# Patient Record
Sex: Male | Born: 1940 | Race: White | Hispanic: No | Smoking: Never smoker
Health system: Southern US, Community
[De-identification: ages and names within clinical notes are randomized; demographics above are authoritative.]

## PROBLEM LIST (undated history)

## (undated) DIAGNOSIS — K219 Gastro-esophageal reflux disease without esophagitis: Secondary | ICD-10-CM

## (undated) DIAGNOSIS — I4891 Unspecified atrial fibrillation: Secondary | ICD-10-CM

## (undated) DIAGNOSIS — I2510 Atherosclerotic heart disease of native coronary artery without angina pectoris: Secondary | ICD-10-CM

## (undated) DIAGNOSIS — R0600 Dyspnea, unspecified: Secondary | ICD-10-CM

## (undated) DIAGNOSIS — I739 Peripheral vascular disease, unspecified: Secondary | ICD-10-CM

## (undated) DIAGNOSIS — I82409 Acute embolism and thrombosis of unspecified deep veins of unspecified lower extremity: Secondary | ICD-10-CM

## (undated) DIAGNOSIS — C801 Malignant (primary) neoplasm, unspecified: Secondary | ICD-10-CM

## (undated) DIAGNOSIS — E119 Type 2 diabetes mellitus without complications: Secondary | ICD-10-CM

## (undated) DIAGNOSIS — Z7901 Long term (current) use of anticoagulants: Secondary | ICD-10-CM

## (undated) DIAGNOSIS — Z9641 Presence of insulin pump (external) (internal): Secondary | ICD-10-CM

## (undated) DIAGNOSIS — G709 Myoneural disorder, unspecified: Secondary | ICD-10-CM

## (undated) DIAGNOSIS — T8859XA Other complications of anesthesia, initial encounter: Secondary | ICD-10-CM

## (undated) DIAGNOSIS — T4145XA Adverse effect of unspecified anesthetic, initial encounter: Secondary | ICD-10-CM

## (undated) DIAGNOSIS — M1990 Unspecified osteoarthritis, unspecified site: Secondary | ICD-10-CM

## (undated) DIAGNOSIS — I219 Acute myocardial infarction, unspecified: Secondary | ICD-10-CM

## (undated) DIAGNOSIS — G4730 Sleep apnea, unspecified: Secondary | ICD-10-CM

## (undated) HISTORY — PX: CORONARY ANGIOPLASTY WITH STENT PLACEMENT: SHX49

## (undated) HISTORY — PX: BACK SURGERY: SHX140

## (undated) HISTORY — PX: DIAGNOSTIC LAPAROSCOPY: SUR761

## (undated) HISTORY — PX: EYE SURGERY: SHX253

## (undated) HISTORY — PX: TONSILLECTOMY: SUR1361

## (undated) HISTORY — PX: AMPUTATION TOE: SHX6595

## (undated) NOTE — *Deleted (*Deleted)
[facility] Perioperative Services  Pre-Admission/Anesthesia Testing Clinical Review  Date: 07/06/20  Patient Demographics:  Name: Carl Yang DOB:   [birth year] MRN:   [id number]  Planned Surgical Procedure(s):    Case: [id number] Date/Time: 07/08/20 0931   Procedure: EXCISION BONE METATARSAL LEFT (Left )   Anesthesia type: Choice   Pre-op diagnosis: L97.525 ULCER LEFT FOOT   Location: [facility] OR ROOM 03 / [facility] ORS FOR ANESTHESIA GROUP   Surgeons: Gwyneth Revels, DPM     NOTE: Available PAT nursing documentation and vital signs have been reviewed. Clinical nursing staff has updated patient's PMH/PSHx, current medication list, and drug allergies/intolerances to ensure comprehensive history available to assist in medical decision making as it pertains to the aforementioned surgical procedure and anticipated anesthetic course.   Clinical Discussion:  Carl Yang is a 89 y.o. male who is submitted for pre-surgical anesthesia review and clearance prior to him undergoing the above procedure. Patient has never been a smoker. Pertinent PMH includes: CAD (s/p PCI and stent placement), MI x2 (1998 and 2003), A. fib, PVD, DVT, HTN, HLD, T1DM (on insulin pump), OSAH (requires nocturnal PAP therapy), dyspnea, GERD (on daily PPI), anemia of chronic disease, OA, peripheral neuropathy, diabetic retinopathy.  Patient is followed by cardiology Lady Gary, MD). He was last seen in the cardiology clinic on 07/04/2020; notes reviewed.  At the time of his last clinic visit, patient was reported to be doing "fairly well" from a cardiac perspective.  He denied any chest pain, significant shortness of breath, PND, orthopnea, palpitations, vertiginous symptoms, presyncope/syncope.  Patient with lower extremity peripheral vascular disease and has plans to undergo podiatric surgery.  Patient's hypertension well controlled on ACEi monotherapy.  He is on a statin for his HLD.  Patient chronically  anticoagulated (warfarin) for his A. fib.  Last TTE done in 03/2006 revealed a LV systolic function with an LVEF of >55%.  Cardiac catheterization performed in 07/2012 revealed two 95% lesions to the mid LAD, and a 75% lesion to the D2; lesions were stented.  I do not see evidence of any further cardiovascular testing. Patient with a functional capacity of >4 METS; activity described as "fairly vigorous". Patient scheduled to follow up with outpatient cardiology in 1 year.   Patient scheduled to undergo podiatric surgery on 07/08/2020 with Dr. Gwyneth Revels.  Patient with an infected ulcer on his foot.  Patient with T1 DM diagnosis.  He was seen by endocrinology on 07/05/2020 and changes were made to his medication regimen.  Given the current infected nature of his wound, both podiatry and endocrinology recommending that patient proceed with surgery despite current glycemic control; hemoglobin A1c 8.3%. Per cardiology, "this patient is optimized for surgery and may proceed with the planned procedural course with a LOW risk stratification. Would proceed with routine cardiopulmonary monitoring. No further cardiac work-up indicated".  This patient is on daily anticoagulation therapy. He has been instructed on recommendations for holding his warfarin for 5 days prior to his procedure. The patient's last dose of his anticoagulant was taken on 07/02/2020.   He reports previous perioperative complications, however is unsure if they are related to his anesthetic course. He reports that after having surgical procedure at [facility] he "coded" during the night. He underwent a general anesthetic course here (ASA III) in 01/2020 with no documented complications. Of note, patient has insulin pump in place. Diabetes coordinator has been made aware. Recommendations are to leave in place during procedure.   Vitals with BMI 06/28/2020  02/12/2020 02/11/2020  Height 6\' 3"  - -  Weight 228 lbs - -  BMI 28.5 - -  Systolic 148 133 956   Diastolic 74 78 62  Pulse 43 82 80    Providers/Specialists:   NOTE: Primary physician provider listed below. Patient may have been seen by APP or partner within same practice.   PROVIDER ROLE LAST Sullivan Lone, DPM Podiatry (Surgeon) 06/30/2020  Marisue Ivan, MD Primary Care Provider 05/12/2020  Harold Hedge, MD Cardiology 07/04/2020   Allergies:  Patient has no known allergies.  Current Home Medications:   . atorvastatin (LIPITOR) 80 MG tablet  . docusate sodium (COLACE) 100 MG capsule  . enoxaparin (LOVENOX) 40 MG/0.4ML injection  . esomeprazole (NEXIUM) 40 MG capsule  . fexofenadine (ALLEGRA) 180 MG tablet  . HUMALOG 100 UNIT/ML injection  . metFORMIN (GLUCOPHAGE) 500 MG tablet  . Multiple Vitamin (MULTIVITAMIN WITH MINERALS) TABS tablet  . ramipril (ALTACE) 2.5 MG capsule  . enoxaparin (LOVENOX) 40 MG/0.4ML injection  . Insulin Human (INSULIN PUMP) SOLN  . oxyCODONE (OXY IR/ROXICODONE) 5 MG immediate release tablet  . traMADol (ULTRAM) 50 MG tablet  . warfarin (COUMADIN) 3 MG tablet  . warfarin (COUMADIN) 4 MG tablet   No current facility-administered medications for this encounter.   History:   Past Medical History:  Diagnosis Date  . Arthritis   . Atrial fibrillation (HCC)   . Cancer (HCC)    Melanoma  . Chronic anticoagulation    Warfarin  . Complication of anesthesia    After surgery at [facility], the patient coded during the night.  . Coronary artery disease    Stents  . Diabetes mellitus without complication (HCC)   . DVT (deep venous thrombosis) (HCC)    Right lower limb  . Dyspnea   . GERD (gastroesophageal reflux disease)   . Myocardial infarction (HCC)    1998, 2003  . Neuromuscular disorder (HCC)    Peripheral Neuropathy r/t DM  . Peripheral vascular disease (HCC)    Diabetes  . Sleep apnea    Uses CPAP   Past Surgical History:  Procedure Laterality Date  . 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  Marland Kitchen  AMPUTATION TOE Left 10/09/2018   Procedure: TOE IPJ 2ND TOE LEFT;  Surgeon: Recardo Evangelist, DPM;  Location: [facility];  Service: Podiatry;  Laterality: Left;  IVA LOCAL Diabeticv - insulin pump  . BACK SURGERY     L4 & L5 Fusion  . CARDIAC CATHETERIZATION  2003  . CORONARY ANGIOPLASTY WITH STENT PLACEMENT     stents x 2  . DIAGNOSTIC LAPAROSCOPY    . EYE SURGERY Bilateral    cataracts  . PARTIAL KNEE ARTHROPLASTY Left 02/09/2020   Procedure: UNICOMPARTMENTAL KNEE;  Surgeon: Christena Flake, MD;  Location: [facility] ORS;  Service: Orthopedics;  Laterality: Left;  . TONSILLECTOMY    . TOTAL KNEE ARTHROPLASTY Right 07/15/2018   Procedure: TOTAL KNEE ARTHROPLASTY;  Surgeon: Christena Flake, MD;  Location: [facility] ORS;  Service: Orthopedics;  Laterality: Right;  . TOTAL KNEE REVISION Right 05/05/2019   Procedure: TOTAL KNEE REVISION;  Surgeon: Christena Flake, MD;  Location: [facility] ORS;  Service: Orthopedics;  Laterality: Right;   History reviewed. No pertinent family history. Social History   Tobacco Use  . Smoking status: Never Smoker  . Smokeless tobacco: Never Used  Vaping Use  . Vaping Use: Never used  Substance Use Topics  . Alcohol use:  Yes    Alcohol/week: 1.0 standard drink    Types: 1 Glasses of wine per week    Comment: Socially  . Drug use: Never    Pertinent Clinical Results:  LABS: {CHL AN LABS REVIEWED:112001::"Labs reviewed: Acceptable for surgery."}  No visits with results within 3 Day(s) from this visit.  Latest known visit with results is:  Hospital Outpatient Visit on 02/26/2020  Component Date Value Ref Range Status  . Prothrombin Time 02/26/2020 19.1* 11.4 - 15.2 seconds Final  . INR 02/26/2020 1.7* 0.8 - 1.2 Final   Comment: (NOTE) INR goal varies based on device and disease states. Performed at [facility], [address] [place], [place] [address]     ECG: Date: 02/01/2020 Time ECG obtained: 1009 AM Rate: 92 bpm Rhythm: normal sinus  Axis (leads I and aVF): Normal Intervals: PR 176 ms. QRS 90 ms. QTc 427 ms. ST segment and T wave changes: Nonspecific ST abnormality  Comparison: Similar to previous tracing obtained on 05/05/2019   IMAGING / PROCEDURES: ECHOCARDIOGRAM done on 04/03/2006 1. LVEF >55% 2. Normal LV systolic function 3. Diastolic function class: Relaxation abnormality (grade 1) corresponds to E/A reversal 4. No valvular regurgitation 5. No valvular stenosis  LEFT HEART CATHETERIZATION done on 12/09/1997 1. Lesion #1: Mid LAD 95% (pre) to <25% (post)  2. Lesion #2: Mid LAD 95% (pre) to <25% (post) 3. Lesion #3: D2 75% (pre) to <25% (post)   Impression and Plan:  Carl Yang has been referred for pre-anesthesia review and clearance prior to him undergoing the planned anesthetic and procedural courses. Available labs, pertinent testing, and imaging results were personally reviewed by me. This patient has been appropriately cleared by cardiology. Diabetes coordinator aware of patient having procedure, as he has an insulin pump in place.    Based on clinical review performed today (07/06/20), barring any significant acute changes in the patient's overall condition, it is anticipated that he will be able to proceed with the planned surgical intervention. Any acute changes in clinical condition may necessitate his procedure being postponed and/or cancelled. Pre-surgical instructions were reviewed with the patient during his PAT appointment and questions were fielded by PAT clinical staff.  Quentin Mulling, MSN, APRN, FNP-C, CEN [facility]  Peri-operative Services Nurse Practitioner Phone: [phone] 07/06/20 1:55 PM  NOTE: This note has been prepared using Dragon dictation software. Despite my best ability to proofread, there is always the potential that unintentional transcriptional errors may still occur from this process.

---

## 2001-09-17 HISTORY — PX: CARDIAC CATHETERIZATION: SHX172

## 2004-08-22 ENCOUNTER — Ambulatory Visit: Payer: Self-pay | Admitting: Internal Medicine

## 2004-09-17 ENCOUNTER — Ambulatory Visit: Payer: Self-pay | Admitting: Internal Medicine

## 2005-03-05 ENCOUNTER — Ambulatory Visit: Payer: Self-pay | Admitting: Internal Medicine

## 2005-03-17 ENCOUNTER — Ambulatory Visit: Payer: Self-pay | Admitting: Internal Medicine

## 2005-04-17 ENCOUNTER — Ambulatory Visit: Payer: Self-pay | Admitting: Internal Medicine

## 2005-08-22 ENCOUNTER — Ambulatory Visit: Payer: Self-pay | Admitting: *Deleted

## 2005-09-12 ENCOUNTER — Inpatient Hospital Stay: Payer: Self-pay | Admitting: Cardiology

## 2005-09-12 ENCOUNTER — Other Ambulatory Visit: Payer: Self-pay

## 2005-10-15 ENCOUNTER — Ambulatory Visit: Payer: Self-pay | Admitting: *Deleted

## 2005-10-30 ENCOUNTER — Other Ambulatory Visit: Payer: Self-pay

## 2006-11-22 ENCOUNTER — Inpatient Hospital Stay: Payer: Self-pay | Admitting: Internal Medicine

## 2008-07-20 ENCOUNTER — Inpatient Hospital Stay: Payer: Self-pay | Admitting: Internal Medicine

## 2009-03-16 ENCOUNTER — Ambulatory Visit: Payer: Self-pay | Admitting: Internal Medicine

## 2009-04-12 ENCOUNTER — Ambulatory Visit: Payer: Self-pay | Admitting: Internal Medicine

## 2009-04-25 ENCOUNTER — Ambulatory Visit: Payer: Self-pay | Admitting: Unknown Physician Specialty

## 2009-10-21 ENCOUNTER — Ambulatory Visit: Payer: Self-pay | Admitting: Podiatry

## 2012-04-25 ENCOUNTER — Ambulatory Visit: Payer: Self-pay | Admitting: Family Medicine

## 2014-03-23 DIAGNOSIS — E109 Type 1 diabetes mellitus without complications: Secondary | ICD-10-CM | POA: Insufficient documentation

## 2014-03-23 DIAGNOSIS — E1069 Type 1 diabetes mellitus with other specified complication: Secondary | ICD-10-CM | POA: Insufficient documentation

## 2014-05-05 DIAGNOSIS — Z86718 Personal history of other venous thrombosis and embolism: Secondary | ICD-10-CM | POA: Insufficient documentation

## 2014-08-18 DIAGNOSIS — Z860101 Personal history of adenomatous and serrated colon polyps: Secondary | ICD-10-CM | POA: Insufficient documentation

## 2014-08-18 DIAGNOSIS — Z86010 Personal history of colonic polyps: Secondary | ICD-10-CM | POA: Insufficient documentation

## 2014-12-23 ENCOUNTER — Ambulatory Visit
Admit: 2014-12-23 | Disposition: A | Discharge status: Home / Self Care | Payer: Self-pay | Attending: Unknown Physician Specialty | Admitting: Unknown Physician Specialty

## 2014-12-24 DIAGNOSIS — Z85820 Personal history of malignant melanoma of skin: Secondary | ICD-10-CM | POA: Insufficient documentation

## 2015-01-10 LAB — SURGICAL PATHOLOGY

## 2015-10-20 DIAGNOSIS — Z89422 Acquired absence of other left toe(s): Secondary | ICD-10-CM | POA: Insufficient documentation

## 2016-03-13 DIAGNOSIS — Z7185 Encounter for immunization safety counseling: Secondary | ICD-10-CM | POA: Insufficient documentation

## 2016-03-13 DIAGNOSIS — Z87898 Personal history of other specified conditions: Secondary | ICD-10-CM | POA: Insufficient documentation

## 2017-03-05 DIAGNOSIS — Z0000 Encounter for general adult medical examination without abnormal findings: Secondary | ICD-10-CM | POA: Insufficient documentation

## 2017-05-23 DIAGNOSIS — M1712 Unilateral primary osteoarthritis, left knee: Secondary | ICD-10-CM | POA: Insufficient documentation

## 2018-02-07 ENCOUNTER — Other Ambulatory Visit: Payer: Self-pay | Admitting: Surgery

## 2018-02-07 DIAGNOSIS — M1712 Unilateral primary osteoarthritis, left knee: Secondary | ICD-10-CM

## 2018-02-07 DIAGNOSIS — M1711 Unilateral primary osteoarthritis, right knee: Secondary | ICD-10-CM

## 2018-06-25 ENCOUNTER — Other Ambulatory Visit: Payer: Self-pay | Admitting: Surgery

## 2018-06-25 ENCOUNTER — Ambulatory Visit
Admission: RE | Admit: 2018-06-25 | Discharge: 2018-06-25 | Disposition: A | Discharge status: Home / Self Care | Payer: Medicare Other | Source: Ambulatory Visit | Attending: Surgery | Admitting: Surgery

## 2018-06-25 DIAGNOSIS — S83281A Other tear of lateral meniscus, current injury, right knee, initial encounter: Secondary | ICD-10-CM | POA: Diagnosis not present

## 2018-06-25 DIAGNOSIS — M1711 Unilateral primary osteoarthritis, right knee: Secondary | ICD-10-CM

## 2018-06-25 DIAGNOSIS — M1712 Unilateral primary osteoarthritis, left knee: Secondary | ICD-10-CM

## 2018-06-25 DIAGNOSIS — S83241A Other tear of medial meniscus, current injury, right knee, initial encounter: Secondary | ICD-10-CM | POA: Diagnosis not present

## 2018-07-02 ENCOUNTER — Ambulatory Visit
Admission: RE | Admit: 2018-07-02 | Discharge: 2018-07-02 | Disposition: A | Discharge status: Home / Self Care | Payer: Medicare Other | Source: Ambulatory Visit | Attending: Surgery | Admitting: Surgery

## 2018-07-02 ENCOUNTER — Encounter
Admission: RE | Admit: 2018-07-02 | Discharge: 2018-07-02 | Disposition: A | Discharge status: Home / Self Care | Payer: Medicare Other | Source: Ambulatory Visit | Attending: Surgery | Admitting: Surgery

## 2018-07-02 ENCOUNTER — Other Ambulatory Visit: Payer: Self-pay

## 2018-07-02 DIAGNOSIS — R9431 Abnormal electrocardiogram [ECG] [EKG]: Secondary | ICD-10-CM | POA: Diagnosis not present

## 2018-07-02 DIAGNOSIS — Z419 Encounter for procedure for purposes other than remedying health state, unspecified: Secondary | ICD-10-CM

## 2018-07-02 DIAGNOSIS — E119 Type 2 diabetes mellitus without complications: Secondary | ICD-10-CM | POA: Insufficient documentation

## 2018-07-02 DIAGNOSIS — Z01818 Encounter for other preprocedural examination: Secondary | ICD-10-CM | POA: Diagnosis not present

## 2018-07-02 DIAGNOSIS — I700 Atherosclerosis of aorta: Secondary | ICD-10-CM | POA: Diagnosis not present

## 2018-07-02 DIAGNOSIS — K449 Diaphragmatic hernia without obstruction or gangrene: Secondary | ICD-10-CM | POA: Insufficient documentation

## 2018-07-02 HISTORY — DX: Dyspnea, unspecified: R06.00

## 2018-07-02 HISTORY — DX: Acute embolism and thrombosis of unspecified deep veins of unspecified lower extremity: I82.409

## 2018-07-02 HISTORY — DX: Adverse effect of unspecified anesthetic, initial encounter: T41.45XA

## 2018-07-02 HISTORY — DX: Unspecified osteoarthritis, unspecified site: M19.90

## 2018-07-02 HISTORY — DX: Other complications of anesthesia, initial encounter: T88.59XA

## 2018-07-02 HISTORY — DX: Atherosclerotic heart disease of native coronary artery without angina pectoris: I25.10

## 2018-07-02 HISTORY — DX: Myoneural disorder, unspecified: G70.9

## 2018-07-02 HISTORY — DX: Gastro-esophageal reflux disease without esophagitis: K21.9

## 2018-07-02 HISTORY — DX: Type 2 diabetes mellitus without complications: E11.9

## 2018-07-02 HISTORY — DX: Sleep apnea, unspecified: G47.30

## 2018-07-02 HISTORY — DX: Acute myocardial infarction, unspecified: I21.9

## 2018-07-02 HISTORY — DX: Malignant (primary) neoplasm, unspecified: C80.1

## 2018-07-02 HISTORY — DX: Peripheral vascular disease, unspecified: I73.9

## 2018-07-02 LAB — TYPE AND SCREEN
ABO/RH(D): A NEG
Antibody Screen: NEGATIVE

## 2018-07-02 LAB — BASIC METABOLIC PANEL
Anion gap: 7 (ref 5–15)
BUN: 22 mg/dL (ref 8–23)
CALCIUM: 8.7 mg/dL — AB (ref 8.9–10.3)
CO2: 26 mmol/L (ref 22–32)
CREATININE: 1.29 mg/dL — AB (ref 0.61–1.24)
Chloride: 108 mmol/L (ref 98–111)
GFR calc Af Amer: >60 mL/min (ref >60)
GFR, EST NON AFRICAN AMERICAN: 53 mL/min — AB (ref >60)
Glucose, Bld: 108 mg/dL — ABNORMAL HIGH (ref 70–99)
POTASSIUM: 4.7 mmol/L (ref 3.5–5.1)
Sodium: 141 mmol/L (ref 135–145)

## 2018-07-02 LAB — CBC
HCT: 44.2 % (ref 39.0–52.0)
HEMOGLOBIN: 14.4 g/dL (ref 13.0–17.0)
MCH: 31 pg (ref 26.0–34.0)
MCHC: 32.6 g/dL (ref 30.0–36.0)
MCV: 95.1 fL (ref 80.0–100.0)
Platelets: 170 10*3/uL (ref 150–400)
RBC: 4.65 MIL/uL (ref 4.22–5.81)
RDW: 13.7 % (ref 11.5–15.5)
WBC: 7 10*3/uL (ref 4.0–10.5)
nRBC: 0 % (ref 0.0–0.2)

## 2018-07-02 LAB — URINALYSIS, ROUTINE W REFLEX MICROSCOPIC
BILIRUBIN URINE: NEGATIVE
Glucose, UA: NEGATIVE mg/dL
Hgb urine dipstick: NEGATIVE
Ketones, ur: NEGATIVE mg/dL
Leukocytes, UA: NEGATIVE
NITRITE: NEGATIVE
PROTEIN: NEGATIVE mg/dL
SPECIFIC GRAVITY, URINE: 1.014 (ref 1.005–1.030)
pH: 6 (ref 5.0–8.0)

## 2018-07-02 LAB — SURGICAL PCR SCREEN
MRSA, PCR: NEGATIVE
STAPHYLOCOCCUS AUREUS: NEGATIVE

## 2018-07-02 NOTE — Pre-Procedure Instructions (Signed)
Dr. Marcello Moores was consulted regarding the patient's insulin pump instructions for day of surgery.The patient was instructed to continue his normal settings with the insulin pump. He is to check his blood sugar at home on the morning of surgery and adjust his pump dosing accordingly. Patient verbalized understanding.

## 2018-07-02 NOTE — Patient Instructions (Addendum)
Your procedure is scheduled on: Tuesday 07/15/18.  Report to DAY SURGERY DEPARTMENT LOCATED ON 2ND FLOOR MEDICAL MALL ENTRANCE. To find out your arrival time please call [phone] between 1PM - 3PM on Monday 07/14/18.  Remember: Instructions that are not followed completely may result in serious medical risk, up to and including death, or upon the discretion of your surgeon and anesthesiologist your surgery may need to be rescheduled.     _X__ 1. Do not eat food after midnight the night before your procedure.                 No gum chewing or hard candies. You may drink SUGAR FREE clear liquids up to 2 hours                 before you are scheduled to arrive for your surgery- DO NOT drink clear                 liquids within 2 hours of the start of your surgery.                  __X__2.  On the morning of surgery brush your teeth with toothpaste and water, you may rinse your mouth with mouthwash if you wish.  Do not swallow any toothpaste or mouthwash.     _X__ 3.  No Alcohol for 24 hours before or after surgery.   _X__ 4.  Do Not Smoke or use e-cigarettes For 24 Hours Prior to Your Surgery.                 Do not use any chewable tobacco products for at least 6 hours prior to                 surgery.  __X__ 5.  Notify your doctor if there is any change in your medical condition      (cold, fever, infections).     Do not wear jewelry, make-up, hairpins, clips or nail polish. Do not wear lotions, powders, or perfumes.  Do not shave 48 hours prior to surgery. Men may shave face and neck. Do not bring valuables to the hospital.    [facility] is not responsible for any belongings or valuables.  Contacts, dentures/partials or body piercings may not be worn into surgery. Bring a case for your contacts, glasses or hearing aids, a denture cup will be supplied. Leave your suitcase in the car. After surgery it may be brought to your room. For patients admitted to the hospital, discharge  time is determined by your treatment team.   Patients discharged the day of surgery will not be allowed to drive home.   Please read over the following fact sheets that you were given:   MRSA Information  __X__ Take these medicines the morning of surgery with A SIP OF WATER:     1. esomeprazole (NEXIUM) 40 MG capsule   2. fexofenadine (ALLEGRA) 180 MG tablet   __X__ Bring your CPAP with you  __X__ Bring any materials you need for your insulin pump.   __X__ Use CHG Soap/SAGE wipes as directed  __X__ Stop Metformin. Last dose will be on Saturday 07/12/18.   __X__ Continue your normal insulin pump routine. Check your blood sugar on the morning of surgery and make your usual adjustments. The anesthesia team will monitor your needs while you are in the operative area.  __X__ Stop Blood Thinners Coumadin/Plavix/Xarelto/Pleta/Pradaxa/Eliquis/Effient/Aspirin. Check with Dr. Netty Starring about stopping your Coumadin for  surgery.  __X__ Stop Anti-inflammatories 7 days before surgery such as Advil, Ibuprofen, Motrin, BC or Goodies Powder, Naprosyn, Naproxen, Aleve, Aspirin, Meloxicam. May take Tylenol if needed for pain or discomfort.   __X__ Stop all herbal supplements until after your procedure. (CBD Oil)

## 2018-07-03 LAB — URINE CULTURE: Culture: NO GROWTH

## 2018-07-10 NOTE — Pre-Procedure Instructions (Signed)
CLEARANCE BY DR Netty Starring MEDIUM RISK ON CHART

## 2018-07-14 MED ORDER — CEFAZOLIN SODIUM-DEXTROSE 2-4 GM/100ML-% IV SOLN
2.0000 g | Freq: Once | INTRAVENOUS | Status: AC
  Administered 2018-07-15: 2 g via INTRAVENOUS

## 2018-07-15 ENCOUNTER — Inpatient Hospital Stay: Payer: Medicare Other

## 2018-07-15 ENCOUNTER — Encounter
Admission: RE | Disposition: A | Discharge status: Skilled Nursing Facility | Payer: Self-pay | Source: Home / Self Care | Attending: Surgery

## 2018-07-15 ENCOUNTER — Encounter: Payer: Self-pay | Admitting: *Deleted

## 2018-07-15 ENCOUNTER — Inpatient Hospital Stay: Payer: Medicare Other | Admitting: Certified Registered Nurse Anesthetist

## 2018-07-15 ENCOUNTER — Inpatient Hospital Stay
Admission: RE | Admit: 2018-07-15 | Discharge: 2018-07-18 | DRG: 470 | Disposition: A | Discharge status: Skilled Nursing Facility | Payer: Medicare Other | Attending: Surgery | Admitting: Surgery

## 2018-07-15 ENCOUNTER — Other Ambulatory Visit: Payer: Self-pay

## 2018-07-15 DIAGNOSIS — E785 Hyperlipidemia, unspecified: Secondary | ICD-10-CM | POA: Diagnosis present

## 2018-07-15 DIAGNOSIS — Z955 Presence of coronary angioplasty implant and graft: Secondary | ICD-10-CM | POA: Diagnosis not present

## 2018-07-15 DIAGNOSIS — Z794 Long term (current) use of insulin: Secondary | ICD-10-CM

## 2018-07-15 DIAGNOSIS — K219 Gastro-esophageal reflux disease without esophagitis: Secondary | ICD-10-CM | POA: Diagnosis present

## 2018-07-15 DIAGNOSIS — Z85820 Personal history of malignant melanoma of skin: Secondary | ICD-10-CM

## 2018-07-15 DIAGNOSIS — Z96651 Presence of right artificial knee joint: Secondary | ICD-10-CM

## 2018-07-15 DIAGNOSIS — Z79899 Other long term (current) drug therapy: Secondary | ICD-10-CM

## 2018-07-15 DIAGNOSIS — Z981 Arthrodesis status: Secondary | ICD-10-CM | POA: Diagnosis not present

## 2018-07-15 DIAGNOSIS — E1142 Type 2 diabetes mellitus with diabetic polyneuropathy: Secondary | ICD-10-CM | POA: Diagnosis present

## 2018-07-15 DIAGNOSIS — Z9842 Cataract extraction status, left eye: Secondary | ICD-10-CM | POA: Diagnosis not present

## 2018-07-15 DIAGNOSIS — Z89412 Acquired absence of left great toe: Secondary | ICD-10-CM | POA: Diagnosis not present

## 2018-07-15 DIAGNOSIS — Z89422 Acquired absence of other left toe(s): Secondary | ICD-10-CM

## 2018-07-15 DIAGNOSIS — I252 Old myocardial infarction: Secondary | ICD-10-CM | POA: Diagnosis not present

## 2018-07-15 DIAGNOSIS — Z7901 Long term (current) use of anticoagulants: Secondary | ICD-10-CM

## 2018-07-15 DIAGNOSIS — M1711 Unilateral primary osteoarthritis, right knee: Principal | ICD-10-CM | POA: Diagnosis present

## 2018-07-15 DIAGNOSIS — E7800 Pure hypercholesterolemia, unspecified: Secondary | ICD-10-CM | POA: Diagnosis present

## 2018-07-15 DIAGNOSIS — E1151 Type 2 diabetes mellitus with diabetic peripheral angiopathy without gangrene: Secondary | ICD-10-CM | POA: Diagnosis present

## 2018-07-15 DIAGNOSIS — I4891 Unspecified atrial fibrillation: Secondary | ICD-10-CM

## 2018-07-15 DIAGNOSIS — I2510 Atherosclerotic heart disease of native coronary artery without angina pectoris: Secondary | ICD-10-CM | POA: Diagnosis present

## 2018-07-15 DIAGNOSIS — Z86718 Personal history of other venous thrombosis and embolism: Secondary | ICD-10-CM | POA: Diagnosis not present

## 2018-07-15 DIAGNOSIS — Z86010 Personal history of colonic polyps: Secondary | ICD-10-CM | POA: Diagnosis not present

## 2018-07-15 DIAGNOSIS — Z9841 Cataract extraction status, right eye: Secondary | ICD-10-CM

## 2018-07-15 DIAGNOSIS — G4730 Sleep apnea, unspecified: Secondary | ICD-10-CM | POA: Diagnosis present

## 2018-07-15 HISTORY — PX: TOTAL KNEE ARTHROPLASTY: SHX125

## 2018-07-15 LAB — CBC
HCT: 42.1 % (ref 39.0–52.0)
Hemoglobin: 13.4 g/dL (ref 13.0–17.0)
MCH: 30.6 pg (ref 26.0–34.0)
MCHC: 31.8 g/dL (ref 30.0–36.0)
MCV: 96.1 fL (ref 80.0–100.0)
PLATELETS: 133 10*3/uL — AB (ref 150–400)
RBC: 4.38 MIL/uL (ref 4.22–5.81)
RDW: 13.7 % (ref 11.5–15.5)
WBC: 9.3 10*3/uL (ref 4.0–10.5)
nRBC: 0 % (ref 0.0–0.2)

## 2018-07-15 LAB — GLUCOSE, CAPILLARY
GLUCOSE-CAPILLARY: 104 mg/dL — AB (ref 70–99)
GLUCOSE-CAPILLARY: 128 mg/dL — AB (ref 70–99)
GLUCOSE-CAPILLARY: 119 mg/dL — AB (ref 70–99)
GLUCOSE-CAPILLARY: 235 mg/dL — AB (ref 70–99)
Glucose-Capillary: 90 mg/dL (ref 70–99)
Glucose-Capillary: 140 mg/dL — ABNORMAL HIGH (ref 70–99)

## 2018-07-15 LAB — ABO/RH: ABO/RH(D): A NEG

## 2018-07-15 LAB — PROTIME-INR
INR: 1.06
PROTHROMBIN TIME: 13.7 s (ref 11.4–15.2)

## 2018-07-15 SURGERY — ARTHROPLASTY, KNEE, TOTAL
Anesthesia: Spinal | Site: Knee | Laterality: Right

## 2018-07-15 MED ORDER — ACETAMINOPHEN 10 MG/ML IV SOLN
INTRAVENOUS | Status: AC
  Filled 2018-07-15: qty 100

## 2018-07-15 MED ORDER — DOCUSATE SODIUM 100 MG PO CAPS
100.0000 mg | ORAL_CAPSULE | Freq: Two times a day (BID) | ORAL | Status: DC
  Administered 2018-07-15 – 2018-07-18 (×7): 100 mg via ORAL
  Filled 2018-07-15 (×7): qty 1

## 2018-07-15 MED ORDER — INSULIN PUMP
Freq: Three times a day (TID) | SUBCUTANEOUS | Status: DC
  Administered 2018-07-15 – 2018-07-16 (×3): via SUBCUTANEOUS
  Administered 2018-07-16: 18.4 via SUBCUTANEOUS
  Administered 2018-07-16 – 2018-07-18 (×6): via SUBCUTANEOUS
  Filled 2018-07-15: qty 1

## 2018-07-15 MED ORDER — METOCLOPRAMIDE HCL 5 MG/ML IJ SOLN: 5.0000 mg | Freq: Three times a day (TID) | INTRAMUSCULAR | Status: DC | PRN

## 2018-07-15 MED ORDER — PROPOFOL 500 MG/50ML IV EMUL
INTRAVENOUS | Status: AC
  Filled 2018-07-15: qty 50

## 2018-07-15 MED ORDER — LORATADINE 10 MG PO TABS
10.0000 mg | ORAL_TABLET | Freq: Every day | ORAL | Status: DC
  Administered 2018-07-16 – 2018-07-18 (×3): 10 mg via ORAL
  Filled 2018-07-15 (×3): qty 1

## 2018-07-15 MED ORDER — METOCLOPRAMIDE HCL 10 MG PO TABS: 5.0000 mg | ORAL_TABLET | Freq: Three times a day (TID) | ORAL | Status: DC | PRN

## 2018-07-15 MED ORDER — WARFARIN - PHARMACIST DOSING INPATIENT
Freq: Every day | Status: DC
  Administered 2018-07-15: 17:00:00

## 2018-07-15 MED ORDER — DIPHENHYDRAMINE HCL 12.5 MG/5ML PO ELIX: 12.5000 mg | ORAL_SOLUTION | ORAL | Status: DC | PRN

## 2018-07-15 MED ORDER — WARFARIN SODIUM 6 MG PO TABS
6.0000 mg | ORAL_TABLET | Freq: Once | ORAL | Status: AC
  Administered 2018-07-15: 6 mg via ORAL
  Filled 2018-07-15: qty 1

## 2018-07-15 MED ORDER — ONDANSETRON HCL 4 MG/2ML IJ SOLN
INTRAMUSCULAR | Status: DC | PRN
  Administered 2018-07-15: 4 mg via INTRAVENOUS

## 2018-07-15 MED ORDER — SODIUM CHLORIDE 0.9 % IV SOLN
INTRAVENOUS | Status: DC | PRN
  Administered 2018-07-15: 60 mL

## 2018-07-15 MED ORDER — PROPOFOL 10 MG/ML IV BOLUS
INTRAVENOUS | Status: DC | PRN
  Administered 2018-07-15: 20 mg via INTRAVENOUS

## 2018-07-15 MED ORDER — FENTANYL CITRATE (PF) 100 MCG/2ML IJ SOLN: 25.0000 ug | INTRAMUSCULAR | Status: DC | PRN

## 2018-07-15 MED ORDER — KETOROLAC TROMETHAMINE 15 MG/ML IJ SOLN
7.5000 mg | Freq: Four times a day (QID) | INTRAMUSCULAR | Status: AC
  Administered 2018-07-15 – 2018-07-16 (×4): 7.5 mg via INTRAVENOUS
  Filled 2018-07-15 (×4): qty 1

## 2018-07-15 MED ORDER — HYDROMORPHONE HCL 1 MG/ML IJ SOLN: 0.5000 mg | INTRAMUSCULAR | Status: DC | PRN

## 2018-07-15 MED ORDER — BUPIVACAINE HCL (PF) 0.5 % IJ SOLN
INTRAMUSCULAR | Status: DC | PRN
  Administered 2018-07-15: 15 mL

## 2018-07-15 MED ORDER — INSULIN LISPRO 100 UNIT/ML ~~LOC~~ SOLN: 150.0000 [IU] | SUBCUTANEOUS | Status: DC

## 2018-07-15 MED ORDER — OXYCODONE HCL 5 MG PO TABS
5.0000 mg | ORAL_TABLET | ORAL | Status: DC | PRN
  Administered 2018-07-15: 5 mg via ORAL
  Administered 2018-07-16 (×3): 10 mg via ORAL
  Administered 2018-07-16: 5 mg via ORAL
  Administered 2018-07-17 – 2018-07-18 (×4): 10 mg via ORAL
  Filled 2018-07-15 (×7): qty 2
  Filled 2018-07-15 (×2): qty 1
  Filled 2018-07-15: qty 2

## 2018-07-15 MED ORDER — PROPOFOL 10 MG/ML IV BOLUS
INTRAVENOUS | Status: AC
  Filled 2018-07-15: qty 20

## 2018-07-15 MED ORDER — ADULT MULTIVITAMIN W/MINERALS CH
1.0000 | ORAL_TABLET | Freq: Every day | ORAL | Status: DC
  Administered 2018-07-16 – 2018-07-18 (×3): 1 via ORAL
  Filled 2018-07-15 (×3): qty 1

## 2018-07-15 MED ORDER — ONDANSETRON HCL 4 MG PO TABS: 4.0000 mg | ORAL_TABLET | Freq: Four times a day (QID) | ORAL | Status: DC | PRN

## 2018-07-15 MED ORDER — NEOMYCIN-POLYMYXIN B GU 40-200000 IR SOLN
Status: DC | PRN
  Administered 2018-07-15: 2 mL
  Administered 2018-07-15: 12 mL

## 2018-07-15 MED ORDER — PANTOPRAZOLE SODIUM 40 MG PO TBEC
40.0000 mg | DELAYED_RELEASE_TABLET | Freq: Every day | ORAL | Status: DC
  Administered 2018-07-16 – 2018-07-18 (×3): 40 mg via ORAL
  Filled 2018-07-15 (×3): qty 1

## 2018-07-15 MED ORDER — ONDANSETRON HCL 4 MG/2ML IJ SOLN: 4.0000 mg | Freq: Once | INTRAMUSCULAR | Status: DC | PRN

## 2018-07-15 MED ORDER — BUPIVACAINE-EPINEPHRINE (PF) 0.5% -1:200000 IJ SOLN
INTRAMUSCULAR | Status: AC
  Filled 2018-07-15: qty 30

## 2018-07-15 MED ORDER — MAGNESIUM HYDROXIDE 400 MG/5ML PO SUSP
30.0000 mL | Freq: Every day | ORAL | Status: DC | PRN
  Administered 2018-07-16 – 2018-07-18 (×3): 30 mL via ORAL
  Filled 2018-07-15 (×3): qty 30

## 2018-07-15 MED ORDER — RAMIPRIL 2.5 MG PO CAPS
2.5000 mg | ORAL_CAPSULE | Freq: Every day | ORAL | Status: DC
  Administered 2018-07-15 – 2018-07-18 (×4): 2.5 mg via ORAL
  Filled 2018-07-15 (×4): qty 1

## 2018-07-15 MED ORDER — WARFARIN SODIUM 4 MG PO TABS
4.0000 mg | ORAL_TABLET | Freq: Every day | ORAL | Status: DC
  Filled 2018-07-15: qty 1

## 2018-07-15 MED ORDER — ENOXAPARIN SODIUM 40 MG/0.4ML ~~LOC~~ SOLN
40.0000 mg | SUBCUTANEOUS | Status: DC
  Administered 2018-07-16 – 2018-07-18 (×3): 40 mg via SUBCUTANEOUS
  Filled 2018-07-15 (×3): qty 0.4

## 2018-07-15 MED ORDER — ACETAMINOPHEN 325 MG PO TABS: 325.0000 mg | ORAL_TABLET | Freq: Four times a day (QID) | ORAL | Status: DC | PRN

## 2018-07-15 MED ORDER — ATORVASTATIN CALCIUM 20 MG PO TABS
80.0000 mg | ORAL_TABLET | Freq: Every day | ORAL | Status: DC
  Administered 2018-07-15 – 2018-07-17 (×3): 80 mg via ORAL
  Filled 2018-07-15 (×3): qty 4

## 2018-07-15 MED ORDER — BISACODYL 10 MG RE SUPP
10.0000 mg | Freq: Every day | RECTAL | Status: DC | PRN
  Administered 2018-07-18: 10 mg via RECTAL
  Filled 2018-07-15: qty 1

## 2018-07-15 MED ORDER — KETOROLAC TROMETHAMINE 15 MG/ML IJ SOLN
INTRAMUSCULAR | Status: AC
  Filled 2018-07-15: qty 1

## 2018-07-15 MED ORDER — BUPIVACAINE LIPOSOME 1.3 % IJ SUSP
INTRAMUSCULAR | Status: AC
  Filled 2018-07-15: qty 20

## 2018-07-15 MED ORDER — TRANEXAMIC ACID 1000 MG/10ML IV SOLN
INTRAVENOUS | Status: DC | PRN
  Administered 2018-07-15: 1000 mg

## 2018-07-15 MED ORDER — TRANEXAMIC ACID 1000 MG/10ML IV SOLN
INTRAVENOUS | Status: AC
  Filled 2018-07-15: qty 10

## 2018-07-15 MED ORDER — KETOROLAC TROMETHAMINE 15 MG/ML IJ SOLN
15.0000 mg | Freq: Once | INTRAMUSCULAR | Status: AC
  Administered 2018-07-15: 15 mg via INTRAVENOUS

## 2018-07-15 MED ORDER — ACETAMINOPHEN 10 MG/ML IV SOLN
INTRAVENOUS | Status: DC | PRN
  Administered 2018-07-15: 1000 mg via INTRAVENOUS

## 2018-07-15 MED ORDER — NEOMYCIN-POLYMYXIN B GU 40-200000 IR SOLN
Status: AC
  Filled 2018-07-15: qty 20

## 2018-07-15 MED ORDER — FENTANYL CITRATE (PF) 100 MCG/2ML IJ SOLN
INTRAMUSCULAR | Status: AC
  Filled 2018-07-15: qty 2

## 2018-07-15 MED ORDER — CEFAZOLIN SODIUM-DEXTROSE 2-4 GM/100ML-% IV SOLN
2.0000 g | Freq: Four times a day (QID) | INTRAVENOUS | Status: AC
  Administered 2018-07-15 – 2018-07-16 (×3): 2 g via INTRAVENOUS
  Filled 2018-07-15 (×3): qty 100

## 2018-07-15 MED ORDER — SODIUM CHLORIDE 0.9 % IV SOLN
INTRAVENOUS | Status: DC
  Administered 2018-07-15 – 2018-07-16 (×2): via INTRAVENOUS

## 2018-07-15 MED ORDER — SODIUM CHLORIDE 0.9 % IV SOLN
INTRAVENOUS | Status: DC
  Administered 2018-07-15 (×2): via INTRAVENOUS

## 2018-07-15 MED ORDER — WARFARIN - PHYSICIAN DOSING INPATIENT: Freq: Every day | Status: DC

## 2018-07-15 MED ORDER — CEFAZOLIN SODIUM-DEXTROSE 2-4 GM/100ML-% IV SOLN
INTRAVENOUS | Status: AC
  Filled 2018-07-15: qty 100

## 2018-07-15 MED ORDER — FENTANYL CITRATE (PF) 100 MCG/2ML IJ SOLN
INTRAMUSCULAR | Status: DC | PRN
  Administered 2018-07-15: 50 ug via INTRAVENOUS

## 2018-07-15 MED ORDER — METFORMIN HCL ER 500 MG PO TB24
500.0000 mg | ORAL_TABLET | Freq: Two times a day (BID) | ORAL | Status: DC
  Administered 2018-07-15 – 2018-07-18 (×6): 500 mg via ORAL
  Filled 2018-07-15 (×7): qty 1

## 2018-07-15 MED ORDER — SODIUM CHLORIDE FLUSH 0.9 % IV SOLN
INTRAVENOUS | Status: AC
  Filled 2018-07-15: qty 40

## 2018-07-15 MED ORDER — ACETAMINOPHEN 500 MG PO TABS
1000.0000 mg | ORAL_TABLET | Freq: Four times a day (QID) | ORAL | Status: AC
  Administered 2018-07-15 – 2018-07-16 (×4): 1000 mg via ORAL
  Filled 2018-07-15 (×4): qty 2

## 2018-07-15 MED ORDER — TRAMADOL HCL 50 MG PO TABS: 50.0000 mg | ORAL_TABLET | Freq: Four times a day (QID) | ORAL | Status: DC | PRN

## 2018-07-15 MED ORDER — ONDANSETRON HCL 4 MG/2ML IJ SOLN: 4.0000 mg | Freq: Four times a day (QID) | INTRAMUSCULAR | Status: DC | PRN

## 2018-07-15 MED ORDER — BUPIVACAINE-EPINEPHRINE (PF) 0.5% -1:200000 IJ SOLN
INTRAMUSCULAR | Status: DC | PRN
  Administered 2018-07-15: 30 mL via PERINEURAL

## 2018-07-15 MED ORDER — FLEET ENEMA 7-19 GM/118ML RE ENEM: 1.0000 | ENEMA | Freq: Once | RECTAL | Status: DC | PRN

## 2018-07-15 MED ORDER — PROPOFOL 500 MG/50ML IV EMUL
INTRAVENOUS | Status: DC | PRN
  Administered 2018-07-15: 55 ug/kg/min via INTRAVENOUS

## 2018-07-15 SURGICAL SUPPLY — 62 items
BANDAGE ELASTIC 6 LF NS (GAUZE/BANDAGES/DRESSINGS) ×3 IMPLANT
BEARING TIBIAL AS KNEE 10 83 (Knees) ×3 IMPLANT
BIT DRILL QUICK REL 1/8 2PK SL (DRILL) ×1 IMPLANT
BLADE SAW SAG 25X90X1.19 (BLADE) ×3 IMPLANT
BLADE SURG SZ20 CARB STEEL (BLADE) ×3 IMPLANT
CANISTER SUCT 1200ML W/VALVE (MISCELLANEOUS) ×3 IMPLANT
CANISTER SUCT 3000ML PPV (MISCELLANEOUS) ×3 IMPLANT
CEMENT BONE R 1X40 (Cement) ×3 IMPLANT
CEMENT VACUUM MIXING SYSTEM (MISCELLANEOUS) ×3 IMPLANT
CHLORAPREP W/TINT 26ML (MISCELLANEOUS) ×3 IMPLANT
COMP FEMORAL CRUC RIGHT 75MM (Joint) ×3 IMPLANT
COMPONENT FEMRL CRUC RT 75MM (Joint) ×1 IMPLANT
COOLER POLAR GLACIER W/PUMP (MISCELLANEOUS) ×3 IMPLANT
COVER MAYO STAND STRL (DRAPES) ×3 IMPLANT
COVER WAND RF STERILE (DRAPES) IMPLANT
CUFF TOURN 24 STER (MISCELLANEOUS) IMPLANT
CUFF TOURN 30 STER DUAL PORT (MISCELLANEOUS) ×3 IMPLANT
DRAPE IMP U-DRAPE 54X76 (DRAPES) ×3 IMPLANT
DRAPE INCISE IOBAN 66X45 STRL (DRAPES) ×3 IMPLANT
DRAPE SHEET LG 3/4 BI-LAMINATE (DRAPES) ×3 IMPLANT
DRILL QUICK RELEASE 1/8 INCH (DRILL) ×2
DRSG OPSITE POSTOP 4X10 (GAUZE/BANDAGES/DRESSINGS) ×3 IMPLANT
DRSG OPSITE POSTOP 4X8 (GAUZE/BANDAGES/DRESSINGS) IMPLANT
ELECT CAUTERY BLADE 6.4 (BLADE) ×3 IMPLANT
ELECT REM PT RETURN 9FT ADLT (ELECTROSURGICAL) ×3
ELECTRODE REM PT RTRN 9FT ADLT (ELECTROSURGICAL) ×1 IMPLANT
GLOVE BIO SURGEON STRL SZ7.5 (GLOVE) ×12 IMPLANT
GLOVE BIO SURGEON STRL SZ8 (GLOVE) ×12 IMPLANT
GLOVE BIOGEL PI IND STRL 8 (GLOVE) ×1 IMPLANT
GLOVE BIOGEL PI INDICATOR 8 (GLOVE) ×2
GLOVE INDICATOR 8.0 STRL GRN (GLOVE) ×3 IMPLANT
GOWN STRL REUS W/ TWL LRG LVL3 (GOWN DISPOSABLE) ×1 IMPLANT
GOWN STRL REUS W/ TWL XL LVL3 (GOWN DISPOSABLE) ×1 IMPLANT
GOWN STRL REUS W/TWL LRG LVL3 (GOWN DISPOSABLE) ×2
GOWN STRL REUS W/TWL XL LVL3 (GOWN DISPOSABLE) ×2
HOLDER FOLEY CATH W/STRAP (MISCELLANEOUS) ×3 IMPLANT
HOOD PEEL AWAY FLYTE STAYCOOL (MISCELLANEOUS) ×9 IMPLANT
IMMBOLIZER KNEE 19 BLUE UNIV (SOFTGOODS) ×3 IMPLANT
KIT TURNOVER KIT A (KITS) ×3 IMPLANT
NDL SAFETY ECLIPSE 18X1.5 (NEEDLE) ×2 IMPLANT
NEEDLE HYPO 18GX1.5 SHARP (NEEDLE) ×4
NEEDLE SPNL 20GX3.5 QUINCKE YW (NEEDLE) ×3 IMPLANT
NS IRRIG 1000ML POUR BTL (IV SOLUTION) ×3 IMPLANT
PACK TOTAL KNEE (MISCELLANEOUS) ×3 IMPLANT
PAD WRAPON POLAR KNEE (MISCELLANEOUS) ×1 IMPLANT
PEG PATELLA SERIES A 37MMX10MM (Orthopedic Implant) ×3 IMPLANT
PLATE TIBIAL INTERLOK 83 (Plate) ×3 IMPLANT
PULSAVAC PLUS IRRIG FAN TIP (DISPOSABLE) ×3
SOL .9 NS 3000ML IRR  AL (IV SOLUTION) ×2
SOL .9 NS 3000ML IRR UROMATIC (IV SOLUTION) ×1 IMPLANT
STAPLER SKIN PROX 35W (STAPLE) ×3 IMPLANT
SUCTION FRAZIER HANDLE 10FR (MISCELLANEOUS) ×2
SUCTION TUBE FRAZIER 10FR DISP (MISCELLANEOUS) ×1 IMPLANT
SUT VIC AB 0 CT1 36 (SUTURE) ×9 IMPLANT
SUT VIC AB 2-0 CT1 27 (SUTURE) ×6
SUT VIC AB 2-0 CT1 TAPERPNT 27 (SUTURE) ×3 IMPLANT
SYR 10ML LL (SYRINGE) ×3 IMPLANT
SYR 20CC LL (SYRINGE) ×3 IMPLANT
SYR 30ML LL (SYRINGE) ×9 IMPLANT
TIP FAN IRRIG PULSAVAC PLUS (DISPOSABLE) ×1 IMPLANT
TRAY FOLEY MTR SLVR 16FR STAT (SET/KITS/TRAYS/PACK) ×3 IMPLANT
WRAPON POLAR PAD KNEE (MISCELLANEOUS) ×3

## 2018-07-15 NOTE — Clinical Social Work Note (Addendum)
Clinical Social Work Assessment  Patient Details  Name: Carl Yang MRN: [id number] Date of Birth: Oct 27, 1940  Date of referral:  07/15/18               Reason for consult:  Facility Placement                Permission sought to share information with:  Chartered certified accountant granted to share information::  Yes, Verbal Permission Granted  Name::      Geophysicist/field seismologist::   [place]   Relationship::     Contact Information:     Housing/Transportation Living arrangements for the past 2 months:  Charity fundraiser of Information:  Patient, Facility Patient Interpreter Needed:  None Criminal Activity/Legal Involvement Pertinent to Current Situation/Hospitalization:  No - Comment as needed Significant Relationships:  Adult Children Lives with:  Self, Facility Resident Do you feel safe going back to the place where you live?  Yes Need for family participation in patient care:  Yes (Comment)  Care giving concerns:  Patient is a resident at [place] [facility] independent living and wants to go to [place] for short term rehab.    Social Worker assessment / plan:  Holiday representative (CSW) received SNF consult. CSW met with patient alone at bedside to discuss D/C plan. Patient was alert and oriented X4 and was sitting up in the bed. CSW introduced self and explained role of CSW department. Per patient he lives alone at the cottage (independent living) at the [place]. Per patient he has 2 adult children, his daughter Carl Yang and son Carl Yang. Per patient his plan is to go to [place] and he has already made arrangements. CSW explained that medicare requires a 3 night qualifying inpatient stay in the hospital in order to pay for SNF. Patient was admitted to inpatient 07/15/18. Patient verbalized his understanding. Per patient he has his own c-pap in his room that will go to [place] with him. FL2 complete and faxed  out. Per [facility] admissions coordinator at [facility] they can accept patient Friday 07/18/18. CSW will continue to follow and assist as needed.   Employment status:  Retired Forensic scientist:  Medicare PT Recommendations:  Not assessed at this time [place] / Referral to community resources:  [place]  Patient/Family's Response to care:  Patient prefers to D/C to [place].   Patient/Family's Understanding of and Emotional Response to Diagnosis, Current Treatment, and Prognosis:  Patient was very pleasant and thanked CSW for assistance.   Emotional Assessment Appearance:  Appears stated age Attitude/Demeanor/Rapport:    Affect (typically observed):  Accepting, Adaptable, Pleasant Orientation:  Oriented to Self, Oriented to Place, Oriented to  Time, Oriented to Situation Alcohol / Substance use:  Not Applicable Psych involvement (Current and /or in the community):  No (Comment)  Discharge Needs  Concerns to be addressed:  Discharge Planning Concerns Readmission within the last 30 days:  No Current discharge risk:  Dependent with Mobility Barriers to Discharge:  Continued Medical Work up   [employer], Veronia Beets, LCSW 07/15/2018, 4:59 PM

## 2018-07-15 NOTE — Op Note (Signed)
07/15/2018  10:11 AM  Patient:   Carl Yang  Pre-Op Diagnosis:   Degenerative joint disease, right knee.  Post-Op Diagnosis:   Same  Procedure:   Right TKA using all-cemented Biomet Vanguard system with a 75 mm PCR femur, an 83 mm tibial tray with a 10 mm AS E-poly insert, and a 37 x 10 mm all-poly 3-pegged domed patella.  Surgeon:   Pascal Lux, MD  Assistant:   Cameron Proud, PA-C; Phoebe Sharps, PA-S  Anesthesia:   Spinal  Findings:   As above  Complications:   None  EBL:   25 cc  Fluids:   1000 cc crystalloid  UOP:   200 cc  TT:   90 minutes at 300 mmHg  Drains:   None  Closure:   Staples  Implants:   As above  Brief Clinical Note:   The patient is a 77 year old male with a long history of progressively worsening right knee pain. The patient's symptoms have progressed despite medications, activity modification, injections, etc. 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 The patient presents at this time for a right total knee arthroplasty.  Procedure:   The patient was brought into the operating room. After adequate spinal anesthesia was obtained, the patient was lain in the supine position. A Foley catheter was placed by the nurse before the right lower extremity was prepped with ChloraPrep solution and draped sterilely. Preoperative antibiotics were administered. After verifying the proper laterality with a surgical timeout, the limb was exsanguinated with an Esmarch and the tourniquet inflated to 300 mmHg. A standard anterior approach to the knee was made through an approximately 7 inch incision. The incision was carried down through the subcutaneous tissues to expose superficial retinaculum. This was split the length of the incision and the medial flap elevated sufficiently to expose the medial retinaculum. The medial retinaculum was incised, leaving a 3-4 mm  cuff of tissue on the patella. This was extended distally along the medial border of the patellar tendon and proximally through the medial third of the quadriceps tendon. A subtotal fat pad excision was performed before the soft tissues were elevated off the anteromedial and anterolateral aspects of the proximal tibia to the level of the collateral ligaments. The anterior portions of the medial and lateral menisci were removed, as was the anterior cruciate ligament. With the knee flexed to 90, the external tibial guide was positioned and the appropriate proximal tibial cut made. This piece was taken to the back table where it was measured and found to be optimally replicated by an 83 mm component.  Attention was directed to the distal femur. The intramedullary canal was accessed through a 3/8" drill hole. The intramedullary guide was inserted and positioned in order to obtain a neutral flexion gap. The intercondylar block was positioned with care taken to avoid notching the anterior cortex of the femur. The appropriate cut was made. Next, the distal cutting block was placed at 6 of valgus alignment. Using the 9 mm slot, the distal cut was made. The distal femur was measured and found to be optimally replicated by the 75 mm component. The 75 mm 4-in-1 cutting block was positioned and first the posterior, then the posterior chamfer, the anterior chamfer, and finally the anterior cuts were made. At this point, the posterior portions medial and lateral menisci were removed. A trial reduction was performed using the appropriate femoral and tibial components  with the 10 mm insert. This demonstrated excellent stability to varus and valgus stressing both in flexion and extension while permitting full extension. Patella tracking was assessed and found to be excellent. Therefore, the tibial guide position was marked on the proximal tibia. The patella thickness was measured and found to be 21 mm. Therefore, the appropriate  cut was made. The patellar surface was measured and found to be optimally replicated by the 37 mm component. The three peg holes were drilled in place before the trial button was inserted. Patella tracking was assessed and found to be excellent, passing the "no thumb test". The lug holes were drilled into the distal femur before the trial component was removed, leaving only the tibial tray. The keel was then created using the appropriate tower, reamer, and punch.  The bony surfaces were prepared for cementing by irrigating thoroughly with bacitracin saline solution. A bone plug was fashioned from some of the bone that had been removed previously and used to plug the distal femoral canal. In addition, 20 cc of Exparel diluted out to 60 cc with normal saline and 30 cc of 0.5% Sensorcaine were injected into the postero-medial and postero-lateral aspects of the knee, the medial and lateral gutter regions, and the peri-incisional tissues to help with postoperative analgesia. Meanwhile, the cement was being mixed on the back table. When it was ready, the tibial tray was cemented in first. The excess cement was removed using [employer] fast streamer. Next, the femoral component was impacted into place. Again, the excess cement was removed using [employer] fast streamer. The 10 mm trial insert was positioned and the knee brought into extension while the cement hardened. Finally, the patella was cemented into place and secured using the patellar clamp. Again, the excess cement was removed using [employer] fast streamer. Once the cement had hardened, the knee was placed through a range of motion with the findings as described above. Therefore, the trial insert was removed and, after verifying that no cement had been retained posteriorly, the permanent insert was positioned and secured using the appropriate key locking mechanism. Again the knee was placed through a range of motion with the findings as described above.  The wound was copiously  irrigated with bacitracin saline solution using the jet lavage system before the quadriceps tendon and retinacular layer were reapproximated using #0 Vicryl interrupted sutures. The superficial retinacular layer also was closed using a running #0 Vicryl suture. A total of 10 cc of transexemic acid (TXA) was injected intra-articularly before the subcutaneous tissues were closed in several layers using 2-0 Vicryl interrupted sutures. The skin was closed using staples. A sterile honeycomb dressing was applied to the skin before the leg was wrapped with an Ace wrap to accommodate the Polar Care device. The patient was then awakened and returned to the recovery room in satisfactory condition after tolerating the procedure well.

## 2018-07-15 NOTE — Evaluation (Signed)
Physical Therapy Evaluation Patient Details Name: Carl Yang MRN: [id number] DOB: 20-Oct-1940 Today's Date: 07/15/2018   History of Present Illness  Patient is a 77 year old male s/p R TKA.  PMH includes PVD, NM disorder, DVT, DM and CAD.  Clinical Impression  Patient is a 77 year old male who lives in independent living at [facility].  Pt is independent with mobility at baseline.  He was able to perform bed mobility mod I and sit at EOB without difficulty.  Pt required bed to be elevated but was able to stand from bedside with min VC's for use of RW.  He completed 8 ft of ambulation with RW and close CGA with antalgic gait but with good response to education regarding gait pattern.  He reported 3/10- pain in R knee following activity.  PT provided education regarding WB status and HEP with issued handout.  Pt will continue to benefit from skilled PT with focus on strength, safe functional mobility, pain management and use of AD.    Follow Up Recommendations Home health PT    Equipment Recommendations  None recommended by PT(TBD at next venue of care.  Pt will need a RW if discharging home.)    Recommendations for Other Services       Precautions / Restrictions Precautions Precautions: Knee Precaution Booklet Issued: Yes (comment) Restrictions Weight Bearing Restrictions: Yes RLE Weight Bearing: Weight bearing as tolerated      Mobility  Bed Mobility Overal bed mobility: Modified Independent             General bed mobility comments: Increased time  Transfers Overall transfer level: Needs assistance Equipment used: Rolling walker (2 wheeled) Transfers: Sit to/from Stand Sit to Stand: Supervision;From elevated surface         General transfer comment: Able to stand from bedside with min VC's for use of RW.  Ambulation/Gait Ambulation/Gait assistance: Min guard Gait Distance (Feet): 8 Feet Assistive device: Rolling walker (2 wheeled)     Gait velocity  interpretation: <1.8 ft/sec, indicate of risk for recurrent falls General Gait Details: Slow antalgic step to gait pattern with good use of RW.  Stairs            Wheelchair Mobility    Modified Rankin (Stroke Patients Only)       Balance Overall balance assessment: Modified Independent                                           Pertinent Vitals/Pain Pain Assessment: 0-10 Pain Score: 3  Pain Location: R knee Pain Intervention(s): Premedicated before session    Home Living Family/patient expects to be discharged to:: Skilled nursing facility Living Arrangements: Alone               Additional Comments: Patient lives in independent living at [facility].    Prior Function Level of Independence: Independent         Comments: Able to get out into the community and travel but did limit some of the mobility due to pain.     Hand Dominance        Extremity/Trunk Assessment   Upper Extremity Assessment Upper Extremity Assessment: Overall WFL for tasks assessed(Grossly 4/5 bilaterally.  No N/T noted.)    Lower Extremity Assessment Lower Extremity Assessment: Overall WFL for tasks assessed(Grossly L LE: 4/5, R LE: 4-/5, Mild numbness noted.)  Cervical / Trunk Assessment Cervical / Trunk Assessment: Normal  Communication   Communication: No difficulties  Cognition Arousal/Alertness: Awake/alert Behavior During Therapy: WFL for tasks assessed/performed Overall Cognitive Status: Within Functional Limits for tasks assessed                                 General Comments: A&o X4      General Comments      Exercises Total Joint Exercises Ankle Circles/Pumps: 10 reps;Both;Supine Quad Sets: Right;Supine;10 reps Straight Leg Raises: Strengthening;Right;10 reps;Supine Goniometric ROM: R knee ext/flex: 6-82 degrees. Other Exercises Other Exercises: Issued HEP and provided education regarding management of HEP x8 min    Assessment/Plan    PT Assessment Patient needs continued PT services  PT Problem List Decreased strength;Decreased mobility;Decreased balance;Decreased knowledge of use of DME;Pain;Decreased activity tolerance       PT Treatment Interventions DME instruction;Therapeutic activities;Gait training;Therapeutic exercise;Patient/family education;Balance training;Functional mobility training;Neuromuscular re-education    PT Goals (Current goals can be found in the Care Plan section)  Acute Rehab PT Goals Patient Stated Goal: To return to walking for exercise and working out in the pool. PT Goal Formulation: With patient Time For Goal Achievement: 08/05/18 Potential to Achieve Goals: Good    Frequency BID   Barriers to discharge        Co-evaluation               AM-PAC PT "6 Clicks" Daily Activity  Outcome Measure Difficulty turning over in bed (including adjusting bedclothes, sheets and blankets)?: A Little Difficulty moving from lying on back to sitting on the side of the bed? : A Little Difficulty sitting down on and standing up from a chair with arms (e.g., wheelchair, bedside commode, etc,.)?: A Little Help needed moving to and from a bed to chair (including a wheelchair)?: A Little Help needed walking in hospital room?: A Little Help needed climbing 3-5 steps with a railing? : A Little 6 Click Score: 18    End of Session Equipment Utilized During Treatment: Gait belt Activity Tolerance: Patient tolerated treatment well Patient left: in chair;with chair alarm set;with call bell/phone within reach Nurse Communication: Mobility status PT Visit Diagnosis: Unsteadiness on feet (R26.81);Muscle weakness (generalized) (M62.81);Pain Pain - Right/Left: Right Pain - part of body: Knee    Time: 2952-8413 PT Time Calculation (min) (ACUTE ONLY): 35 min   Charges:   PT Evaluation $PT Eval Low Complexity: 1 Low PT Treatments $Therapeutic Activity: 8-22 mins         Glenetta Hew, PT, DPT   Glenetta Hew 07/15/2018, 4:55 PM

## 2018-07-15 NOTE — Anesthesia Procedure Notes (Signed)
Spinal  Patient location during procedure: OR Start time: 07/15/2018 7:50 AM End time: 07/15/2018 7:54 AM Staffing Resident/CRNA: Willette Alma, CRNA Performed: resident/CRNA  Preanesthetic Checklist Completed: patient identified, site marked, surgical consent, pre-op evaluation, timeout performed, IV checked, risks and benefits discussed and monitors and equipment checked Spinal Block Patient position: sitting Prep: ChloraPrep Patient monitoring: heart rate, continuous pulse ox and blood pressure Approach: midline Location: L3-4 Injection technique: single-shot Needle Needle type: Pencan  Needle gauge: 24 G Needle length: 10 cm Assessment Sensory level: T10 Additional Notes Pt tolerated procedure well. VSS.

## 2018-07-15 NOTE — Clinical Social Work Placement (Signed)
CLINICAL SOCIAL WORK PLACEMENT  NOTE  Date:  07/15/2018  Patient Details  Name: Carl Yang MRN: [id number] Date of Birth: Oct 28, 1940  Clinical Social Work is seeking post-discharge placement for this patient at the Skilled  Nursing Facility level of care (*CSW will initial, date and re-position this form in  chart as items are completed):  Yes   Patient/family provided with [place] Clinical Social Work Department's list of facilities offering this level of care within the geographic area requested by the patient (or if unable, by the patient's family).  Yes   Patient/family informed of their freedom to choose among providers that offer the needed level of care, that participate in Medicare, Medicaid or managed care program needed by the patient, have an available bed and are willing to accept the patient.  Yes   Patient/family informed of 's ownership interest in [facility] and [facility], as well as of the fact that they are under no obligation to receive care at these facilities.  PASRR submitted to EDS on 07/15/18     PASRR number received on 07/15/18     Existing PASRR number confirmed on       FL2 transmitted to all facilities in geographic area requested by pt/family on 07/15/18     FL2 transmitted to all facilities within larger geographic area on       Patient informed that his/her managed care company has contracts with or will negotiate with certain facilities, including the following:        Yes   Patient/family informed of bed offers received.  Patient chooses bed at [facility] )     Physician recommends and patient chooses bed at      Patient to be transferred to   on  .  Patient to be transferred to facility by       Patient family notified on   of transfer.  Name of family member notified:        PHYSICIAN       Additional Comment:    _______________________________________________ Joss Friedel, Darleen Crocker,  LCSW 07/15/2018, 4:55 PM

## 2018-07-15 NOTE — H&P (Signed)
Paper H&P to be scanned into permanent record. H&P reviewed and patient re-examined. No changes. 

## 2018-07-15 NOTE — Progress Notes (Signed)
ANTICOAGULATION CONSULT NOTE - Initial Consult  Pharmacy Consult for warfarin Indication: atrial fibrillation per consult  No Known Allergies  Patient Measurements: Height: 6\' 3"  (190.5 cm) Weight: 228 lb 6 oz (103.6 kg) IBW/kg (Calculated) : 84.5  Vital Signs: Temp: 97.7 F (36.5 C) (10/29 1200) Temp Source: Oral (10/29 1200) BP: 131/74 (10/29 1232) Pulse Rate: 58 (10/29 1232)  Labs: No results for input(s): HGB, HCT, PLT, APTT, LABPROT, INR, HEPARINUNFRC, HEPRLOWMOCWT, CREATININE, CKTOTAL, CKMB, TROPONINI in the last 72 hours.  Estimated Creatinine Clearance: 64.5 mL/min (A) (by C-G formula based on SCr of 1.29 mg/dL (H)).  Assessment: 77 yo male s/p Right TKA to restart warfarin Home dose noted to be warfarin 4 mg daily per PTA med list  Goal of Therapy:  INR 2-3 Monitor platelets by anticoagulation protocol: Yes   Plan:  Will order baseline INR and CBC per policy MD has ordered warfarin 6 mg PO x1 tonight and 4 mg daily starting tomorrow Will order daily INR Will need CBC at least every three days per policy  Pharmacy will continue to follow  Rayna Sexton L 07/15/2018,12:35 PM

## 2018-07-15 NOTE — NC FL2 (Signed)
[place] MEDICAID [id number] LEVEL OF CARE SCREENING TOOL     IDENTIFICATION  Patient Name: Carl Yang Birthdate: 25-Feb-1941 Sex: male Admission Date (Current Location): 07/15/2018  [place] and [place] Number:  Chiropodist and Address:  [facility], [address], [place], [place] [address]      Provider Number: [id number]  Attending Physician Name and Address:  Christena Flake, MD  Relative Name and Phone Number:       Current Level of Care: Hospital Recommended Level of Care: Skilled Nursing Facility Prior Approval Number:    Date Approved/Denied:   PASRR Number: [id number])  Discharge Plan: SNF    Current Diagnoses: Patient Active Problem List   Diagnosis Date Noted  . Status post total knee replacement using cement, right 07/15/2018    Orientation RESPIRATION BLADDER Height & Weight     Self, Time, Situation, Place  Normal Continent Weight: 228 lb 6 oz (103.6 kg) Height:  6\' 3"  (190.5 cm)  BEHAVIORAL SYMPTOMS/MOOD NEUROLOGICAL BOWEL NUTRITION STATUS      Continent Diet(Diet: Heart Healthy )  AMBULATORY STATUS COMMUNICATION OF NEEDS Skin   Extensive Assist Verbally Surgical wounds(Incision: Right Knee. )                       Personal Care Assistance Level of Assistance  Bathing, Feeding, Dressing Bathing Assistance: Limited assistance Feeding assistance: Independent Dressing Assistance: Limited assistance     Functional Limitations Info  Sight, Hearing, Speech Sight Info: Adequate Hearing Info: Adequate Speech Info: Adequate    SPECIAL CARE FACTORS FREQUENCY  PT (By licensed PT), OT (By licensed OT)     PT Frequency: (5) OT Frequency: (5)            Contractures      Additional Factors Info  Code Status, Allergies Code Status Info: (Full Code. ) Allergies Info: (No Known Allergies. )           Current Medications (07/15/2018):  This is the current hospital active medication list Current  Facility-Administered Medications  Medication Dose Route Frequency Provider Last Rate Last Dose  . 0.9 %  sodium chloride infusion   Intravenous Continuous Poggi, Excell Seltzer, MD   Stopped at 07/15/18 1404  . acetaminophen (TYLENOL) tablet 1,000 mg  1,000 mg Oral Q6H Poggi, Excell Seltzer, MD   1,000 mg at 07/15/18 1222  . [START ON 07/16/2018] acetaminophen (TYLENOL) tablet 325-650 mg  325-650 mg Oral Q6H PRN Poggi, Excell Seltzer, MD      . atorvastatin (LIPITOR) tablet 80 mg  80 mg Oral q1800 Poggi, Excell Seltzer, MD      . bisacodyl (DULCOLAX) suppository 10 mg  10 mg Rectal Daily PRN Poggi, Excell Seltzer, MD      . ceFAZolin (ANCEF) IVPB 2g/100 mL premix  2 g Intravenous Q6H Poggi, Excell Seltzer, MD   Stopped at 07/15/18 1434  . diphenhydrAMINE (BENADRYL) 12.5 MG/5ML elixir 12.5-25 mg  12.5-25 mg Oral Q4H PRN Poggi, Excell Seltzer, MD      . docusate sodium (COLACE) capsule 100 mg  100 mg Oral BID Poggi, Excell Seltzer, MD   100 mg at 07/15/18 1405  . [START ON 07/16/2018] enoxaparin (LOVENOX) injection 40 mg  40 mg Subcutaneous Q24H Poggi, Excell Seltzer, MD      . HYDROmorphone (DILAUDID) injection 0.5-1 mg  0.5-1 mg Intravenous Q4H PRN Poggi, Excell Seltzer, MD      . insulin pump   Subcutaneous TID AC, HS,  0200 Poggi, Excell Seltzer, MD      . ketorolac (TORADOL) 15 MG/ML injection 7.5 mg  7.5 mg Intravenous Q6H Poggi, Excell Seltzer, MD   7.5 mg at 07/15/18 1222  . ketorolac (TORADOL) 15 MG/ML injection           . [START ON 07/16/2018] loratadine (CLARITIN) tablet 10 mg  10 mg Oral Daily Poggi, Excell Seltzer, MD      . magnesium hydroxide (MILK OF MAGNESIA) suspension 30 mL  30 mL Oral Daily PRN Poggi, Excell Seltzer, MD      . metFORMIN (GLUCOPHAGE-XR) 24 hr tablet 500 mg  500 mg Oral BID WC Poggi, Excell Seltzer, MD      . metoCLOPramide (REGLAN) tablet 5-10 mg  5-10 mg Oral Q8H PRN Poggi, Excell Seltzer, MD       Or  . metoCLOPramide (REGLAN) injection 5-10 mg  5-10 mg Intravenous Q8H PRN Poggi, Excell Seltzer, MD      . Melene Muller ON 07/16/2018] multivitamin with minerals tablet 1 tablet  1 tablet Oral Daily  Poggi, Excell Seltzer, MD      . ondansetron (ZOFRAN) tablet 4 mg  4 mg Oral Q6H PRN Poggi, Excell Seltzer, MD       Or  . ondansetron (ZOFRAN) injection 4 mg  4 mg Intravenous Q6H PRN Poggi, Excell Seltzer, MD      . oxyCODONE (Oxy IR/ROXICODONE) immediate release tablet 5-10 mg  5-10 mg Oral Q4H PRN Poggi, Excell Seltzer, MD   5 mg at 07/15/18 1440  . [START ON 07/16/2018] pantoprazole (PROTONIX) EC tablet 40 mg  40 mg Oral Daily Poggi, Excell Seltzer, MD      . ramipril (ALTACE) capsule 2.5 mg  2.5 mg Oral Daily Poggi, Excell Seltzer, MD   2.5 mg at 07/15/18 1405  . sodium phosphate (FLEET) 7-19 GM/118ML enema 1 enema  1 enema Rectal Once PRN Poggi, Excell Seltzer, MD      . traMADol Janean Sark) tablet 50 mg  50 mg Oral Q6H PRN Poggi, Excell Seltzer, MD      . Melene Muller ON 07/16/2018] warfarin (COUMADIN) tablet 4 mg  4 mg Oral q1800 Poggi, Excell Seltzer, MD      . warfarin (COUMADIN) tablet 6 mg  6 mg Oral ONCE-1800 Poggi, Excell Seltzer, MD      . Warfarin - Pharmacist Dosing Inpatient   Does not apply q1800 Marty Heck, [facility]         Discharge Medications: Please see discharge summary for a list of discharge medications.  Relevant Imaging Results:  Relevant Lab Results:   Additional Information (SSN: [id number])  Clayborn Milnes, Darleen Crocker, LCSW

## 2018-07-15 NOTE — Progress Notes (Signed)
Inpatient Diabetes Program Recommendations  AACE/ADA: New Consensus Statement on Inpatient Glycemic Control (2015)  Target Ranges:  Prepandial:   less than 140 mg/dL      Peak postprandial:   less than 180 mg/dL (1-2 hours)      Critically ill patients:  140 - 180 mg/dL    Results for Carl Yang, Carl Yang (MRN [id number]) as of 07/15/2018 12:02  Ref. Range 07/15/2018 06:15 07/15/2018 08:52 07/15/2018 10:23  Glucose-Capillary Latest Ref Range: 70 - 99 mg/dL 104 (H) 90 128 (H)    Admit with: R Total Knee  History: Type 1 DM  Home DM Meds: Insulin Pump  Current Orders: Insulin Pump   Met with pt today Post-Op.  Pt A&O and able to independently manage Insulin Pump.  Reviewed Pump settings with pt.  Reviewed all documentation needs with both pt and RN.  Encouraged pt to ask for additional CBG checks if needed.  Pt to let RN know how much Insulin he boluses himself with per the pump.  Has access to extra supplies if needed.  Changed set/site/reservoir this AM.    Endocrinologist: Dr. Mee Hives with [facility]--last Seen 05/08/2018 Uses Insulin Pump at home (see below for settings- Verified with pt today at bedside) Basal rates Midnight = 1.6 5 AM = 1.9 8:30 AM = 2.5 2 PM = 2.6 TDD basal: 54.4  Bolus settings I/C: 4 at midnight, 3 at 11 AM ISF: 13 Target Glucose: 100 Active insulin time: 5 hours    --Will follow patient during hospitalization--  Wyn Quaker RN, MSN, CDE Diabetes Coordinator Inpatient Glycemic Control Team Team Pager: [phone] (8a-5p)

## 2018-07-15 NOTE — Anesthesia Preprocedure Evaluation (Signed)
Anesthesia Evaluation  Patient identified by MRN, date of birth, ID band Patient awake    Reviewed: Allergy & Precautions, H&P , NPO status , Patient's Chart, lab work & pertinent test results, reviewed documented beta blocker date and time   History of Anesthesia Complications (+) history of anesthetic complications  Airway Mallampati: III  TM Distance: >3 FB Neck ROM: full    Dental  (+) Caps, Dental Advidsory Given, Teeth Intact   Pulmonary neg shortness of breath, sleep apnea and Continuous Positive Airway Pressure Ventilation , neg COPD, neg recent URI,           Cardiovascular Exercise Tolerance: Good (-) hypertension(-) angina+ CAD, + Past MI, + Cardiac Stents and + Peripheral Vascular Disease  (-) CABG (-) dysrhythmias (-) Valvular Problems/Murmurs     Neuro/Psych negative neurological ROS  negative psych ROS   GI/Hepatic Neg liver ROS, GERD  ,  Endo/Other  diabetes  Renal/GU negative Renal ROS  negative genitourinary   Musculoskeletal   Abdominal   Peds  Hematology negative hematology ROS (+)   Anesthesia Other Findings Past Medical History: No date: Arthritis No date: Cancer (HCC)     Comment:  Melanoma No date: Complication of anesthesia     Comment:  After surgery at [facility], the patient coded during the               night. No date: Coronary artery disease     Comment:  Stents No date: Diabetes mellitus without complication (HCC) No date: DVT (deep venous thrombosis) (HCC)     Comment:  Right lower limb No date: Dyspnea No date: GERD (gastroesophageal reflux disease) No date: Myocardial infarction [facility])     Comment:  1998 No date: Neuromuscular disorder ([place])     Comment:  Peripheral Neuropathy r/t DM No date: Peripheral vascular disease (HCC)     Comment:  Diabetes No date: Sleep apnea     Comment:  Uses CPAP   Reproductive/Obstetrics negative OB ROS                              Anesthesia Physical Anesthesia Plan  ASA: III  Anesthesia Plan: Spinal   Post-op Pain Management:    Induction: Intravenous  PONV Risk Score and Plan: 1 and Propofol infusion and TIVA  Airway Management Planned: Natural Airway and Simple Face Mask  Additional Equipment:   Intra-op Plan:   Post-operative Plan:   Informed Consent: I have reviewed the patients History and Physical, chart, labs and discussed the procedure including the risks, benefits and alternatives for the proposed anesthesia with the patient or authorized representative who has indicated his/her understanding and acceptance.   Dental Advisory Given  Plan Discussed with: Anesthesiologist, CRNA and Surgeon  Anesthesia Plan Comments:         Anesthesia Quick Evaluation

## 2018-07-15 NOTE — Transfer of Care (Signed)
Immediate Anesthesia Transfer of Care Note  Patient: Carl Yang  Procedure(s) Performed: TOTAL KNEE ARTHROPLASTY (Right Knee)  Patient Location: PACU  Anesthesia Type:MAC and Spinal  Level of Consciousness: awake, alert  and oriented  Airway & Oxygen Therapy: Patient Spontanous Breathing and Patient connected to nasal cannula oxygen  Post-op Assessment: Report given to RN and Post -op Vital signs reviewed and stable  Post vital signs: stable  Last Vitals:  Vitals Value Taken Time  BP 123/77 07/15/2018 10:15 AM  Temp 36.3 C 07/15/2018 10:15 AM  Pulse 56 07/15/2018 10:21 AM  Resp 22 07/15/2018 10:21 AM  SpO2 93 % 07/15/2018 10:21 AM  Vitals shown include unvalidated device data.  Last Pain:  Vitals:   07/15/18 0614  TempSrc: Oral  PainSc: 0-No pain         Complications: No apparent anesthesia complications

## 2018-07-15 NOTE — Anesthesia Post-op Follow-up Note (Signed)
Anesthesia QCDR form completed.        

## 2018-07-16 ENCOUNTER — Encounter: Payer: Self-pay | Admitting: Surgery

## 2018-07-16 LAB — CBC WITH DIFFERENTIAL/PLATELET
Abs Immature Granulocytes: 0.04 10*3/uL (ref 0.00–0.07)
BASOS ABS: 0 10*3/uL (ref 0.0–0.1)
Basophils Relative: 1 %
EOS ABS: 0.3 10*3/uL (ref 0.0–0.5)
EOS PCT: 4 %
HEMATOCRIT: 38.4 % — AB (ref 39.0–52.0)
HEMOGLOBIN: 12.1 g/dL — AB (ref 13.0–17.0)
Immature Granulocytes: 1 %
LYMPHS PCT: 14 %
Lymphs Abs: 1.1 10*3/uL (ref 0.7–4.0)
MCH: 30.5 pg (ref 26.0–34.0)
MCHC: 31.5 g/dL (ref 30.0–36.0)
MCV: 96.7 fL (ref 80.0–100.0)
Monocytes Absolute: 0.7 10*3/uL (ref 0.1–1.0)
Monocytes Relative: 9 %
NRBC: 0 % (ref 0.0–0.2)
Neutro Abs: 5.5 10*3/uL (ref 1.7–7.7)
Neutrophils Relative %: 71 %
Platelets: 125 10*3/uL — ABNORMAL LOW (ref 150–400)
RBC: 3.97 MIL/uL — AB (ref 4.22–5.81)
RDW: 13.9 % (ref 11.5–15.5)
WBC: 7.7 10*3/uL (ref 4.0–10.5)

## 2018-07-16 LAB — GLUCOSE, CAPILLARY
Glucose-Capillary: 145 mg/dL — ABNORMAL HIGH (ref 70–99)
Glucose-Capillary: 128 mg/dL — ABNORMAL HIGH (ref 70–99)
Glucose-Capillary: 182 mg/dL — ABNORMAL HIGH (ref 70–99)
Glucose-Capillary: 151 mg/dL — ABNORMAL HIGH (ref 70–99)

## 2018-07-16 LAB — BASIC METABOLIC PANEL
Anion gap: 8 (ref 5–15)
BUN: 29 mg/dL — AB (ref 8–23)
CHLORIDE: 106 mmol/L (ref 98–111)
CO2: 25 mmol/L (ref 22–32)
CREATININE: 1.39 mg/dL — AB (ref 0.61–1.24)
Calcium: 8.1 mg/dL — ABNORMAL LOW (ref 8.9–10.3)
GFR calc Af Amer: 56 mL/min — ABNORMAL LOW (ref >60)
GFR calc non Af Amer: 48 mL/min — ABNORMAL LOW (ref >60)
Glucose, Bld: 129 mg/dL — ABNORMAL HIGH (ref 70–99)
Potassium: 4.7 mmol/L (ref 3.5–5.1)
SODIUM: 139 mmol/L (ref 135–145)

## 2018-07-16 LAB — PROTIME-INR
INR: 1.12
Prothrombin Time: 14.3 seconds (ref 11.4–15.2)

## 2018-07-16 MED ORDER — WARFARIN SODIUM 3 MG PO TABS: 3.0000 mg | ORAL_TABLET | ORAL | Status: DC

## 2018-07-16 MED ORDER — WARFARIN SODIUM 4 MG PO TABS
4.0000 mg | ORAL_TABLET | ORAL | Status: DC
  Administered 2018-07-17: 4 mg via ORAL
  Filled 2018-07-16 (×2): qty 1

## 2018-07-16 MED ORDER — WARFARIN SODIUM 4 MG PO TABS
4.0000 mg | ORAL_TABLET | Freq: Once | ORAL | Status: AC
  Administered 2018-07-16: 4 mg via ORAL
  Filled 2018-07-16: qty 1

## 2018-07-16 NOTE — Progress Notes (Signed)
Physical Therapy Treatment Patient Details Name: Carl Yang MRN: [id number] DOB: 1941/09/12 Today's Date: 07/16/2018    History of Present Illness Patient is a 77 year old male s/p R TKA.  PMH includes PVD, NM disorder, DVT, DM and CAD.    PT Comments    Stood and was able to ambulate around nursing unit with pm with walker and min guard.  To bathroom to void.  Returned to bed with min guard.  Participated in exercises as described below.   Follow Up Recommendations  Home health PT;Supervision for mobility/OOB     Equipment Recommendations       Recommendations for Other Services       Precautions / Restrictions Precautions Precautions: Knee Restrictions Weight Bearing Restrictions: Yes RLE Weight Bearing: Weight bearing as tolerated    Mobility  Bed Mobility               General bed mobility comments: in recliner  Transfers     Transfers: Sit to/from Stand Sit to Stand: Supervision;From elevated surface            Ambulation/Gait Ambulation/Gait assistance: Min guard Gait Distance (Feet): 180 Feet Assistive device: Rolling walker (2 wheeled) Gait Pattern/deviations: Step-through pattern Gait velocity: decreased   General Gait Details: generally steady   Optometrist    Modified Rankin (Stroke Patients Only)       Balance Overall balance assessment: Modified Independent                                          Cognition Arousal/Alertness: Awake/alert Behavior During Therapy: WFL for tasks assessed/performed Overall Cognitive Status: Within Functional Limits for tasks assessed                                        Exercises Other Exercises Other Exercises: ankle pumps, quad sets and SLR x 10    General Comments        Pertinent Vitals/Pain Pain Assessment: 0-10 Pain Score: 2  Pain Location: R knee Pain Descriptors / Indicators: Sharp;Tightness Pain  Intervention(s): Monitored during session    Home Living                      Prior Function            PT Goals (current goals can now be found in the care plan section) Progress towards PT goals: Progressing toward goals    Frequency    BID      PT Plan Current plan remains appropriate    Co-evaluation              AM-PAC PT "6 Clicks" Daily Activity  Outcome Measure  Difficulty turning over in bed (including adjusting bedclothes, sheets and blankets)?: None Difficulty moving from lying on back to sitting on the side of the bed? : None Difficulty sitting down on and standing up from a chair with arms (e.g., wheelchair, bedside commode, etc,.)?: A Little Help needed moving to and from a bed to chair (including a wheelchair)?: A Little Help needed walking in hospital room?: A Little Help needed climbing 3-5 steps with a railing? : A Little 6 Click Score: 20    End  of Session Equipment Utilized During Treatment: Gait belt Activity Tolerance: Patient tolerated treatment well Patient left: in bed;with bed alarm set;with call bell/phone within reach   Pain - Right/Left: Right Pain - part of body: Knee     Time: 6387-5643 PT Time Calculation (min) (ACUTE ONLY): 19 min  Charges:  $Gait Training: 8-22 mins                     Danielle Dess, PTA 07/16/18, 3:58 PM

## 2018-07-16 NOTE — Progress Notes (Signed)
Physical Therapy Treatment Patient Details Name: Carl Yang MRN: [id number] DOB: 13-Feb-1941 Today's Date: 07/16/2018    History of Present Illness Patient is a 77 year old male s/p R TKA.  PMH includes PVD, NM disorder, DVT, DM and CAD.    PT Comments    Patient demonstrated progress with gait quality and distance this morning.  He was also able to demonstrate carryover with performance of there ex.  Pt presented with drainage of blood from knee which nurse has noted.  He was able to stand from elevated bedside without physical assist and walk 50 ft with RW.  Pt reported 3/10 pain in R knee following ambulation and there ex.  Pt will continue to benefit from skilled PT with focus on strength, use of AD, pain management and knee ROM.   Follow Up Recommendations  Home health PT;Supervision for mobility/OOB     Equipment Recommendations  None recommended by PT    Recommendations for Other Services       Precautions / Restrictions Precautions Precautions: Knee Precaution Booklet Issued: Yes (comment) Restrictions Weight Bearing Restrictions: Yes RLE Weight Bearing: Weight bearing as tolerated    Mobility  Bed Mobility Overal bed mobility: Modified Independent             General bed mobility comments: Increased time  Transfers Overall transfer level: Needs assistance Equipment used: Rolling walker (2 wheeled) Transfers: Sit to/from Stand Sit to Stand: Supervision;From elevated surface         General transfer comment: Still requires VC's for hand placement with RW but receptive to education.  Requires that bed be elevated due to pt height.  Ambulation/Gait Ambulation/Gait assistance: Min guard Gait Distance (Feet): 50 Feet Assistive device: Rolling walker (2 wheeled)     Gait velocity interpretation: <1.8 ft/sec, indicate of risk for recurrent falls General Gait Details: Zettie Pho to manage a step through gait pattern today with decreased speed; good  management of RW.  VC's provided for sequencing during turning and sitting in recliner.   Stairs             Wheelchair Mobility    Modified Rankin (Stroke Patients Only)       Balance Overall balance assessment: Modified Independent                                          Cognition Arousal/Alertness: Awake/alert Behavior During Therapy: WFL for tasks assessed/performed Overall Cognitive Status: Within Functional Limits for tasks assessed                                 General Comments: A&O X4      Exercises Total Joint Exercises Quad Sets: Right;Strengthening;Seated(12 reps) Short Arc Quad: Right;Seated;Strengthening(12 reps) Hip ABduction/ADduction: Strengthening;Seated(12 reps) Straight Leg Raises: Strengthening;Right(12 reps) Knee Flexion: 5 reps;Right;Seated Other Exercises Other Exercises: Review of home exercises and management of schedule. x5 min    General Comments        Pertinent Vitals/Pain Pain Assessment: 0-10 Pain Score: 2  Pain Location: R knee Pain Descriptors / Indicators: Sharp;Tightness Pain Intervention(s): Monitored during session;Premedicated before session    Home Living                      Prior Function  PT Goals (current goals can now be found in the care plan section) Acute Rehab PT Goals Patient Stated Goal: To return to walking for exercise and working out in the pool. PT Goal Formulation: With patient Time For Goal Achievement: 08/05/18 Potential to Achieve Goals: Good Progress towards PT goals: Progressing toward goals    Frequency    BID      PT Plan Current plan remains appropriate    Co-evaluation              AM-PAC PT "6 Clicks" Daily Activity  Outcome Measure  Difficulty turning over in bed (including adjusting bedclothes, sheets and blankets)?: A Little Difficulty moving from lying on back to sitting on the side of the bed? : A  Little Difficulty sitting down on and standing up from a chair with arms (e.g., wheelchair, bedside commode, etc,.)?: A Little Help needed moving to and from a bed to chair (including a wheelchair)?: A Little Help needed walking in hospital room?: A Little Help needed climbing 3-5 steps with a railing? : A Little 6 Click Score: 18    End of Session Equipment Utilized During Treatment: Gait belt Activity Tolerance: Patient tolerated treatment well Patient left: in chair;with chair alarm set;with call bell/phone within reach   PT Visit Diagnosis: Muscle weakness (generalized) (M62.81);Pain Pain - Right/Left: Right     Time: 1610-9604 PT Time Calculation (min) (ACUTE ONLY): 35 min  Charges:  $Therapeutic Exercise: 8-22 mins $Therapeutic Activity: 8-22 mins                     Glenetta Hew, PT, DPT    Glenetta Hew 07/16/2018, 10:52 AM

## 2018-07-16 NOTE — Progress Notes (Signed)
Assumed care of patient at 2300. Patient alert and oriented. Medicated for pain x1 with oxycodone. Patient not able to sleep much during the night. No further bleeding. CPM not used any further due to the bleeding per previous nurses report. Patient looking forward to PT this morning

## 2018-07-16 NOTE — Anesthesia Postprocedure Evaluation (Signed)
Anesthesia Post Note  Patient: Carl Yang  Procedure(s) Performed: TOTAL KNEE ARTHROPLASTY (Right Knee)  Patient location during evaluation: Other Anesthesia Type: Spinal Level of consciousness: oriented and awake and alert Pain management: pain level controlled Vital Signs Assessment: post-procedure vital signs reviewed and stable Respiratory status: spontaneous breathing, respiratory function stable and patient connected to nasal cannula oxygen Cardiovascular status: blood pressure returned to baseline and stable Postop Assessment: no headache, no backache and no apparent nausea or vomiting Anesthetic complications: no     Last Vitals:  Vitals:   07/15/18 2354 07/16/18 0445  BP: 132/70 (!) 143/73  Pulse: 64 66  Resp: 18 18  Temp: 36.5 C 36.6 C  SpO2: 100% 99%    Last Pain:  Vitals:   07/16/18 0445  TempSrc: Oral  PainSc:                  [name]

## 2018-07-16 NOTE — Progress Notes (Signed)
  Subjective: 1 Day Post-Op Procedure(s) (LRB): TOTAL KNEE ARTHROPLASTY (Right) Patient reports pain as mild.   Patient is well but is having moderate bloody drainage to the right knee PT and Care management to assist with discharge planning. Negative for chest pain and shortness of breath Fever: no Gastrointestinal:Negative for nausea and vomiting  Objective: Vital signs in last 24 hours: Temp:  [97.3 F (36.3 C)-98.4 F (36.9 C)] 97.9 F (36.6 C) (10/30 0445) Pulse Rate:  [42-72] 66 (10/30 0445) Resp:  [15-23] 18 (10/30 0445) BP: (123-153)/(60-84) 143/73 (10/30 0445) SpO2:  [92 %-100 %] 99 % (10/30 0445)  Intake/Output from previous day:  Intake/Output Summary (Last 24 hours) at 07/16/2018 0808 Last data filed at 07/16/2018 0006 Gross per 24 hour  Intake 1591.5 ml  Output 450 ml  Net 1141.5 ml    Intake/Output this shift: No intake/output data recorded.  Labs: Recent Labs    07/15/18 1249 07/16/18 0334  HGB 13.4 12.1*   Recent Labs    07/15/18 1249 07/16/18 0334  WBC 9.3 7.7  RBC 4.38 3.97*  HCT 42.1 38.4*  PLT 133* 125*   Recent Labs    07/16/18 0334  NA 139  K 4.7  CL 106  CO2 25  BUN 29*  CREATININE 1.39*  GLUCOSE 129*  CALCIUM 8.1*   Recent Labs    07/15/18 1249 07/16/18 0334  INR 1.06 1.12     EXAM General - Patient is Alert, Appropriate and Oriented Extremity - ABD soft Neurovascular intact Sensation intact distally Intact pulses distally Dorsiflexion/Plantar flexion intact Incision: moderate drainage No cellulitis present Dressing/Incision - blood tinged drainage, increased knee dressing was applied yesterday. Motor Function - intact, moving foot and toes well on exam.  Abdomen soft with normal BS this AM.  Past Medical History:  Diagnosis Date  . Arthritis   . Cancer ([place])    Melanoma  . Complication of anesthesia    After surgery at [facility], the patient coded during the night.  . Coronary artery disease    Stents  .  Diabetes mellitus without complication ([place])   . DVT (deep venous thrombosis) ([facility])    Right lower limb  . Dyspnea   . GERD (gastroesophageal reflux disease)   . Myocardial infarction ([place])    1998  . Neuromuscular disorder ([place])    Peripheral Neuropathy r/t DM  . Peripheral vascular disease ([place])    Diabetes  . Sleep apnea    Uses CPAP    Assessment/Plan: 1 Day Post-Op Procedure(s) (LRB): TOTAL KNEE ARTHROPLASTY (Right) Active Problems:   Status post total knee replacement using cement, right  Estimated body mass index is 28.54 kg/m as calculated from the following:   Height as of this encounter: 6\' 3"  (1.905 m).   Weight as of this encounter: 103.6 kg. Advance diet Up with therapy D/C IV fluids when tolerating po intake.  Labs reviewed this AM. INR 1.12, continue to bridge with Lovenox.  Pharmacy dosing Warfarin. Moderate bloody drainage, dressing change today, avoid CPM. Up with therapy, begin working on BM. CBC, BMP and INR ordered for tomorrow.  DVT Prophylaxis - Lovenox, Coumadin, Foot Pumps and TED hose Weight-Bearing as tolerated to right leg  J. Cameron Proud, PA-C [facility] 07/16/2018, 8:08 AM

## 2018-07-16 NOTE — Progress Notes (Signed)
ANTICOAGULATION CONSULT NOTE - Initial Consult  Pharmacy Consult for warfarin Indication: atrial fibrillation per consult, Hx of DVT  No Known Allergies  Patient Measurements: Height: 6\' 3"  (190.5 cm) Weight: 228 lb 6 oz (103.6 kg) IBW/kg (Calculated) : 84.5  Vital Signs: Temp: 98 F (36.7 C) (10/30 0856) Temp Source: Oral (10/30 0445) BP: 135/85 (10/30 0856) Pulse Rate: 80 (10/30 0856)  Labs: Recent Labs    07/15/18 1249 07/16/18 0334  HGB 13.4 12.1*  HCT 42.1 38.4*  PLT 133* 125*  LABPROT 13.7 14.3  INR 1.06 1.12  CREATININE  --  1.39*    Estimated Creatinine Clearance: 59.8 mL/min (A) (by C-G formula based on SCr of 1.39 mg/dL (H)).  Assessment: 77 yo male s/p Right TKA to restart warfarin and history of A. Fib and DVT Home dose changed on 10/22 by IM at [facility].  Home Regimen: Warfarin 3mg  Wed,Sat,Sun                             Warfarin 4mg  Mon, Tues, Thurs, Friday   DATE INR DOSE 10/29 1.06 6mg  10/30 1.12  Goal of Therapy:  INR 2-3 Monitor platelets by anticoagulation protocol: Yes   Plan:  Will order baseline INR and CBC per policy Will order Warfarin 4mg  x 1 dose today, then start patients home regimen.  Enoxaparin 40mg  ordered daily - will need to discontinue once INR therapeutic.   Will order daily INR Will need CBC at least every three days per policy  Pharmacy will continue to follow  Pernell Dupre, PharmD, BCPS Clinical Pharmacist 07/16/2018 12:05 PM

## 2018-07-17 LAB — CBC
HCT: 36.6 % — ABNORMAL LOW (ref 39.0–52.0)
HEMOGLOBIN: 11.8 g/dL — AB (ref 13.0–17.0)
MCH: 30.9 pg (ref 26.0–34.0)
MCHC: 32.2 g/dL (ref 30.0–36.0)
MCV: 95.8 fL (ref 80.0–100.0)
NRBC: 0 % (ref 0.0–0.2)
Platelets: 117 10*3/uL — ABNORMAL LOW (ref 150–400)
RBC: 3.82 MIL/uL — ABNORMAL LOW (ref 4.22–5.81)
RDW: 13.9 % (ref 11.5–15.5)
WBC: 8.8 10*3/uL (ref 4.0–10.5)

## 2018-07-17 LAB — GLUCOSE, CAPILLARY
GLUCOSE-CAPILLARY: 132 mg/dL — AB (ref 70–99)
GLUCOSE-CAPILLARY: 155 mg/dL — AB (ref 70–99)
GLUCOSE-CAPILLARY: 95 mg/dL (ref 70–99)
Glucose-Capillary: 141 mg/dL — ABNORMAL HIGH (ref 70–99)
Glucose-Capillary: 145 mg/dL — ABNORMAL HIGH (ref 70–99)

## 2018-07-17 LAB — BASIC METABOLIC PANEL
ANION GAP: 5 (ref 5–15)
BUN: 24 mg/dL — ABNORMAL HIGH (ref 8–23)
CHLORIDE: 108 mmol/L (ref 98–111)
CO2: 23 mmol/L (ref 22–32)
Calcium: 8.1 mg/dL — ABNORMAL LOW (ref 8.9–10.3)
Creatinine, Ser: 1.19 mg/dL (ref 0.61–1.24)
GFR calc Af Amer: >60 mL/min (ref >60)
GFR calc non Af Amer: 58 mL/min — ABNORMAL LOW (ref >60)
GLUCOSE: 148 mg/dL — AB (ref 70–99)
POTASSIUM: 4.5 mmol/L (ref 3.5–5.1)
SODIUM: 136 mmol/L (ref 135–145)

## 2018-07-17 LAB — PROTIME-INR
INR: 1.37
Prothrombin Time: 16.7 seconds — ABNORMAL HIGH (ref 11.4–15.2)

## 2018-07-17 NOTE — Progress Notes (Signed)
Physical Therapy Treatment Patient Details Name: Carl Yang MRN: [id number] DOB: 1940/11/03 Today's Date: 07/17/2018    History of Present Illness Patient is a 77 year old male s/p R TKA.  PMH includes PVD, NM disorder, DVT, DM and CAD.    PT Comments    Participated in exercises as described below.  To edge of bed with rail and inc time.  Stood with min guard/supervision and was able to ambulate around unit x 1 with walker and min guard.  No buckling or LOB noted.   Follow Up Recommendations  Home health PT;Supervision for mobility/OOB  Pt stated he is transitioning to [facility] for rehab upon discharge.     Equipment Recommendations  None recommended by PT    Recommendations for Other Services       Precautions / Restrictions Precautions Precautions: Knee Restrictions Weight Bearing Restrictions: Yes RLE Weight Bearing: Weight bearing as tolerated    Mobility  Bed Mobility Overal bed mobility: Modified Independent             General bed mobility comments: increased time and use of rail.  Transfers Overall transfer level: Needs assistance Equipment used: Rolling walker (2 wheeled) Transfers: Sit to/from Stand Sit to Stand: Supervision;From elevated surface         General transfer comment: increased ease today  Ambulation/Gait Ambulation/Gait assistance: Min guard Gait Distance (Feet): 180 Feet Assistive device: Rolling walker (2 wheeled) Gait Pattern/deviations: Step-through pattern Gait velocity: decreased   General Gait Details: generally steady   Optometrist    Modified Rankin (Stroke Patients Only)       Balance Overall balance assessment: Modified Independent                                          Cognition Arousal/Alertness: Awake/alert Behavior During Therapy: WFL for tasks assessed/performed Overall Cognitive Status: Within Functional Limits for tasks assessed                                  General Comments: A&O X4      Exercises Total Joint Exercises Ankle Circles/Pumps: 10 reps;Both;Supine Quad Sets: Right;Strengthening;Seated Short Arc Quad: Right;Seated;Strengthening Hip ABduction/ADduction: Strengthening;Seated Straight Leg Raises: Strengthening;Right Knee Flexion: 5 reps;Right;Seated Goniometric ROM: 3-96    General Comments        Pertinent Vitals/Pain Pain Assessment: 0-10 Pain Score: 4  Pain Location: R knee Pain Descriptors / Indicators: Sharp;Tightness Pain Intervention(s): Limited activity within patient's tolerance;Monitored during session;Premedicated before session    Home Living                      Prior Function            PT Goals (current goals can now be found in the care plan section) Progress towards PT goals: Progressing toward goals    Frequency    BID      PT Plan Current plan remains appropriate    Co-evaluation              AM-PAC PT "6 Clicks" Daily Activity  Outcome Measure  Difficulty turning over in bed (including adjusting bedclothes, sheets and blankets)?: None Difficulty moving from lying on back to sitting on the side of the bed? :  None Difficulty sitting down on and standing up from a chair with arms (e.g., wheelchair, bedside commode, etc,.)?: A Little Help needed moving to and from a bed to chair (including a wheelchair)?: A Little Help needed walking in hospital room?: A Little Help needed climbing 3-5 steps with a railing? : A Little 6 Click Score: 20    End of Session Equipment Utilized During Treatment: Gait belt Activity Tolerance: Patient tolerated treatment well Patient left: in chair;with call bell/phone within reach;with chair alarm set Nurse Communication: Mobility status Pain - Right/Left: Right Pain - part of body: Knee     Time: 4098-1191 PT Time Calculation (min) (ACUTE ONLY): 17 min  Charges:  $Gait Training: 8-22 mins                      Danielle Dess, PTA 07/17/18, 10:13 AM

## 2018-07-17 NOTE — Progress Notes (Signed)
Called pt by phone today.  Pt stated he changed his set/site/reservoir/insulin this afternoon.  Placed new site about 4 inches away from old site on R side abdomen.  CBGs have been stable.  Remains A&O and appropriate for Insulin Pump in hospital.    --Will follow patient during hospitalization--  Wyn Quaker RN, MSN, CDE Diabetes Coordinator Inpatient Glycemic Control Team Team Pager: [phone] (8a-5p)

## 2018-07-17 NOTE — Progress Notes (Signed)
ANTICOAGULATION CONSULT NOTE - Initial Consult  Pharmacy Consult for warfarin Indication: atrial fibrillation per consult, Hx of DVT  No Known Allergies  Patient Measurements: Height: 6\' 3"  (190.5 cm) Weight: 228 lb 6 oz (103.6 kg) IBW/kg (Calculated) : 84.5  Vital Signs: Temp: 99.4 F (37.4 C) (10/31 0810) Temp Source: Oral (10/31 0810) BP: 127/59 (10/31 1000) Pulse Rate: 76 (10/31 0810)  Labs: Recent Labs    07/15/18 1249 07/16/18 0334 07/17/18 0622  HGB 13.4 12.1* 11.8*  HCT 42.1 38.4* 36.6*  PLT 133* 125* 117*  LABPROT 13.7 14.3 16.7*  INR 1.06 1.12 1.37  CREATININE  --  1.39* 1.19    Estimated Creatinine Clearance: 69.9 mL/min (by C-G formula based on SCr of 1.19 mg/dL).  Assessment: 77 yo male s/p Right TKA to restart warfarin and history of A. Fib and DVT Home dose changed on 10/22 by IM at [facility].  Home Regimen: Warfarin 3mg  Wed,Sat,Sun                             Warfarin 4mg  Osker Mason, Friday   DATE INR DOSE 10/29 1.06 6mg  10/30 1.12     4mg  10/31   1.37  Goal of Therapy:  INR 2-3 Monitor platelets by anticoagulation protocol: Yes   Plan:  INR is still subtherapeutic but trending up as expected, will continue with patients home regimen.  Enoxaparin 40mg  ordered daily - will need to discontinue once INR therapeutic.   Will order daily INR Will need CBC at least every three days per policy  Pharmacy will continue to follow  Paulina Fusi, PharmD, BCPS 07/17/2018 3:32 PM

## 2018-07-17 NOTE — Progress Notes (Signed)
Physical Therapy Treatment Patient Details Name: Carl Yang MRN: [id number] DOB: 05/26/1941 Today's Date: 07/17/2018    History of Present Illness Patient is a 77 year old male s/p R TKA.  PMH includes PVD, NM disorder, DVT, DM and CAD.    PT Comments    Pt ready to walk again today.  Stood with increased assist from recliner due to low height and was able to walk slowly around nursing unit and to bathroom with walker and min guard before returning to supine to rest.  Pt with some decreased gait quality and fatigue this session.     Follow Up Recommendations  Home health PT;Supervision for mobility/OOB - Pt reports discharge tomorrow to [facility] for rehab.     Equipment Recommendations  Rolling walker with 5" wheels    Recommendations for Other Services       Precautions / Restrictions Precautions Precautions: Knee Restrictions Weight Bearing Restrictions: Yes RLE Weight Bearing: Weight bearing as tolerated    Mobility  Bed Mobility Overal bed mobility: Modified Independent             General bed mobility comments: increased time and use of rail.  Transfers Overall transfer level: Needs assistance Equipment used: Rolling walker (2 wheeled) Transfers: Sit to/from Stand Sit to Stand: Min guard;Min assist         General transfer comment: has increased diffuculty from lower surfaces like recliner and requires assist.  Ambulation/Gait Ambulation/Gait assistance: Min guard Gait Distance (Feet): 180 Feet Assistive device: Rolling walker (2 wheeled) Gait Pattern/deviations: Step-through pattern Gait velocity: decreased   General Gait Details: generally steady   Optometrist    Modified Rankin (Stroke Patients Only)       Balance Overall balance assessment: Modified Independent                                          Cognition Arousal/Alertness: Awake/alert Behavior During Therapy: WFL for  tasks assessed/performed Overall Cognitive Status: Within Functional Limits for tasks assessed                                 General Comments: A&O X4      Exercises Total Joint Exercises Ankle Circles/Pumps: 10 reps;Both;Supine Quad Sets: Right;Strengthening;Seated Short Arc Quad: Right;Seated;Strengthening Hip ABduction/ADduction: Strengthening;Seated Straight Leg Raises: Strengthening;Right Knee Flexion: 5 reps;Right;Seated Goniometric ROM: 3-96 Other Exercises Other Exercises: commode to void.    General Comments        Pertinent Vitals/Pain Pain Assessment: 0-10 Pain Score: 5  Pain Location: R knee Pain Descriptors / Indicators: Sharp;Tightness Pain Intervention(s): Limited activity within patient's tolerance;Monitored during session;Repositioned;Ice applied    Home Living                      Prior Function            PT Goals (current goals can now be found in the care plan section) Progress towards PT goals: Progressing toward goals    Frequency    BID      PT Plan Current plan remains appropriate    Co-evaluation              AM-PAC PT "6 Clicks" Daily Activity  Outcome Measure  Difficulty turning over  in bed (including adjusting bedclothes, sheets and blankets)?: None Difficulty moving from lying on back to sitting on the side of the bed? : None Difficulty sitting down on and standing up from a chair with arms (e.g., wheelchair, bedside commode, etc,.)?: A Little Help needed moving to and from a bed to chair (including a wheelchair)?: A Little Help needed walking in hospital room?: A Little Help needed climbing 3-5 steps with a railing? : A Little 6 Click Score: 20    End of Session Equipment Utilized During Treatment: Gait belt Activity Tolerance: Patient tolerated treatment well Patient left: in bed;with call bell/phone within reach;with bed alarm set Nurse Communication: Mobility status Pain - Right/Left:  Right Pain - part of body: Knee     Time: 1610-9604 PT Time Calculation (min) (ACUTE ONLY): 15 min  Charges:  $Gait Training: 8-22 mins                     Danielle Dess, PTA 07/17/18, 12:08 PM

## 2018-07-17 NOTE — Progress Notes (Signed)
Subjective: 2 Days Post-Op Procedure(s) (LRB): TOTAL KNEE ARTHROPLASTY (Right) Patient reports pain as mild.   Patient is well but is having moderate bloody drainage to the right knee Current plan is d/c to [facility] tomorrow pending medical improvement. Negative for chest pain and shortness of breath Fever: no Gastrointestinal:Negative for nausea and vomiting  Objective: Vital signs in last 24 hours: Temp:  [98.1 F (36.7 C)-99.4 F (37.4 C)] 99.4 F (37.4 C) (10/31 0810) Pulse Rate:  [33-84] 76 (10/31 0810) Resp:  [13-18] 18 (10/30 2344) BP: (127-165)/(54-78) 127/59 (10/31 1000) SpO2:  [94 %-98 %] 95 % (10/31 0810)  Intake/Output from previous day:  Intake/Output Summary (Last 24 hours) at 07/17/2018 1121 Last data filed at 07/16/2018 1833 Gross per 24 hour  Intake 240 ml  Output 200 ml  Net 40 ml    Intake/Output this shift: No intake/output data recorded.  Labs: Recent Labs    07/15/18 1249 07/16/18 0334 07/17/18 0622  HGB 13.4 12.1* 11.8*   Recent Labs    07/16/18 0334 07/17/18 0622  WBC 7.7 8.8  RBC 3.97* 3.82*  HCT 38.4* 36.6*  PLT 125* 117*   Recent Labs    07/16/18 0334 07/17/18 0622  NA 139 136  K 4.7 4.5  CL 106 108  CO2 25 23  BUN 29* 24*  CREATININE 1.39* 1.19  GLUCOSE 129* 148*  CALCIUM 8.1* 8.1*   Recent Labs    07/16/18 0334 07/17/18 0622  INR 1.12 1.37     EXAM General - Patient is Alert, Appropriate and Oriented Extremity - ABD soft Neurovascular intact Sensation intact distally Intact pulses distally Dorsiflexion/Plantar flexion intact Incision: moderate drainage No cellulitis present Dressing/Incision - blood tinged drainage, dressing change performed yesterday. Motor Function - intact, moving foot and toes well on exam.  Abdomen soft with normal BS this AM.  Past Medical History:  Diagnosis Date  . Arthritis   . Cancer ([place])    Melanoma  . Complication of anesthesia    After surgery at [facility], the patient  coded during the night.  . Coronary artery disease    Stents  . Diabetes mellitus without complication ([place])   . DVT (deep venous thrombosis) ([facility])    Right lower limb  . Dyspnea   . GERD (gastroesophageal reflux disease)   . Myocardial infarction ([place])    1998  . Neuromuscular disorder ([place])    Peripheral Neuropathy r/t DM  . Peripheral vascular disease ([place])    Diabetes  . Sleep apnea    Uses CPAP    Assessment/Plan: 2 Days Post-Op Procedure(s) (LRB): TOTAL KNEE ARTHROPLASTY (Right) Active Problems:   Status post total knee replacement using cement, right  Estimated body mass index is 28.54 kg/m as calculated from the following:   Height as of this encounter: 6\' 3"  (1.905 m).   Weight as of this encounter: 103.6 kg. Advance diet Up with therapy D/C IV fluids when tolerating po intake.  Labs reviewed this AM. INR 1.37, continue to bridge with Lovenox.  Pharmacy dosing Warfarin. Moderate bloody drainage, improved compared to yesterday, continue to monitor. Up with therapy, FLEET enema as needed today. CBC, BMP and INR ordered for tomorrow.  DVT Prophylaxis - Lovenox, Coumadin, Foot Pumps and TED hose Weight-Bearing as tolerated to right leg  J. Cameron Proud, PA-C [facility] 07/17/2018, 11:21 AM

## 2018-07-18 ENCOUNTER — Encounter
Admission: RE | Admit: 2018-07-18 | Discharge: 2018-07-18 | Disposition: A | Discharge status: Home / Self Care | Payer: Medicare Other | Source: Ambulatory Visit | Attending: Internal Medicine | Admitting: Internal Medicine

## 2018-07-18 LAB — PROTIME-INR
INR: 1.39
Prothrombin Time: 16.9 seconds — ABNORMAL HIGH (ref 11.4–15.2)

## 2018-07-18 LAB — GLUCOSE, CAPILLARY
GLUCOSE-CAPILLARY: 95 mg/dL (ref 70–99)
Glucose-Capillary: 98 mg/dL (ref 70–99)

## 2018-07-18 LAB — BASIC METABOLIC PANEL
ANION GAP: 8 (ref 5–15)
BUN: 20 mg/dL (ref 8–23)
CHLORIDE: 104 mmol/L (ref 98–111)
CO2: 24 mmol/L (ref 22–32)
Calcium: 8.6 mg/dL — ABNORMAL LOW (ref 8.9–10.3)
Creatinine, Ser: 1.23 mg/dL (ref 0.61–1.24)
GFR calc non Af Amer: 56 mL/min — ABNORMAL LOW (ref >60)
Glucose, Bld: 108 mg/dL — ABNORMAL HIGH (ref 70–99)
POTASSIUM: 4.7 mmol/L (ref 3.5–5.1)
SODIUM: 136 mmol/L (ref 135–145)

## 2018-07-18 MED ORDER — ENOXAPARIN SODIUM 40 MG/0.4ML ~~LOC~~ SOLN: 40.0000 mg | SUBCUTANEOUS | 0 refills | Status: DC

## 2018-07-18 MED ORDER — TRAMADOL HCL 50 MG PO TABS: 50.0000 mg | ORAL_TABLET | Freq: Four times a day (QID) | ORAL | 0 refills | Status: DC | PRN

## 2018-07-18 MED ORDER — OXYCODONE HCL 5 MG PO TABS: 5.0000 mg | ORAL_TABLET | ORAL | 0 refills | Status: DC | PRN

## 2018-07-18 NOTE — Progress Notes (Signed)
Subjective: 3 Days Post-Op Procedure(s) (LRB): TOTAL KNEE ARTHROPLASTY (Right) Patient reports pain as mild.   Patient is well without any acute complaints this AM. Current plan is d/c to [facility] today following a BM. Negative for chest pain and shortness of breath Fever: no Gastrointestinal:Negative for nausea and vomiting  Objective: Vital signs in last 24 hours: Temp:  [97.8 F (36.6 C)-99.4 F (37.4 C)] 98.3 F (36.8 C) (11/01 0022) Pulse Rate:  [71-96] 71 (11/01 0022) Resp:  [16] 16 (11/01 0022) BP: (127-165)/(59-79) 163/79 (11/01 0022) SpO2:  [91 %-99 %] 91 % (11/01 0032)  Intake/Output from previous day:  Intake/Output Summary (Last 24 hours) at 07/18/2018 0741 Last data filed at 07/18/2018 0315 Gross per 24 hour  Intake -  Output 400 ml  Net -400 ml    Intake/Output this shift: No intake/output data recorded.  Labs: Recent Labs    07/15/18 1249 07/16/18 0334 07/17/18 0622  HGB 13.4 12.1* 11.8*   Recent Labs    07/16/18 0334 07/17/18 0622  WBC 7.7 8.8  RBC 3.97* 3.82*  HCT 38.4* 36.6*  PLT 125* 117*   Recent Labs    07/17/18 0622 07/18/18 0550  NA 136 136  K 4.5 4.7  CL 108 104  CO2 23 24  BUN 24* 20  CREATININE 1.19 1.23  GLUCOSE 148* 108*  CALCIUM 8.1* 8.6*   Recent Labs    07/17/18 0622 07/18/18 0550  INR 1.37 1.39     EXAM General - Patient is Alert, Appropriate and Oriented Extremity - ABD soft Neurovascular intact Sensation intact distally Intact pulses distally Dorsiflexion/Plantar flexion intact Incision: scant drainage Dressing/Incision - blood tinged drainage, honeycomb dressing intact. Motor Function - intact, moving foot and toes well on exam.  Abdomen soft with normal BS this AM.  Past Medical History:  Diagnosis Date  . Arthritis   . Cancer ([place])    Melanoma  . Complication of anesthesia    After surgery at [facility], the patient coded during the night.  . Coronary artery disease    Stents  . Diabetes mellitus  without complication ([place])   . DVT (deep venous thrombosis) ([facility])    Right lower limb  . Dyspnea   . GERD (gastroesophageal reflux disease)   . Myocardial infarction ([place])    1998  . Neuromuscular disorder ([place])    Peripheral Neuropathy r/t DM  . Peripheral vascular disease ([place])    Diabetes  . Sleep apnea    Uses CPAP    Assessment/Plan: 3 Days Post-Op Procedure(s) (LRB): TOTAL KNEE ARTHROPLASTY (Right) Active Problems:   Status post total knee replacement using cement, right  Estimated body mass index is 28.54 kg/m as calculated from the following:   Height as of this encounter: 6\' 3"  (1.905 m).   Weight as of this encounter: 103.6 kg. Advance diet Up with therapy  Labs reviewed this AM. INR 1.39, continue to bridge with Lovenox.  Home dosing for warfarin at this time. Continue to work on [employer] today, move on to suppository/enema today. Plan will be for discharge to SNF today after bowel movement.  DVT Prophylaxis - Lovenox, Coumadin, Foot Pumps and TED hose Weight-Bearing as tolerated to right leg  J. Cameron Proud, PA-C [facility] Orthopaedic Surgery 07/18/2018, 7:41 AM

## 2018-07-18 NOTE — Care Management Important Message (Signed)
Important Message  Patient Details  Name: Carl Yang MRN: [id number] Date of Birth: November 04, 1940   Medicare Important Message Given:  Yes    [name] 07/18/2018, 11:04 AM

## 2018-07-18 NOTE — Progress Notes (Signed)
Discharge paperwork and prescriptions given to [facility] transporter.

## 2018-07-18 NOTE — Clinical Social Work Placement (Addendum)
CLINICAL SOCIAL WORK PLACEMENT  NOTE  Date:  07/18/2018  Patient Details  Name: Carl Yang MRN: [id number] Date of Birth: 16-Aug-1941  Clinical Social Work is seeking post-discharge placement for this patient at the Skilled  Nursing Facility level of care (*CSW will initial, date and re-position this form in  chart as items are completed):  Yes   Patient/family provided with [facility] Clinical Social Work Department's list of facilities offering this level of care within the geographic area requested by the patient (or if unable, by the patient's family).  Yes   Patient/family informed of their freedom to choose among providers that offer the needed level of care, that participate in Medicare, Medicaid or managed care program needed by the patient, have an available bed and are willing to accept the patient.  Yes   Patient/family informed of [facility]'s ownership interest in [facility] and [facility], as well as of the fact that they are under no obligation to receive care at these facilities.  PASRR submitted to EDS on 07/15/18     PASRR number received on 07/15/18     Existing PASRR number confirmed on       FL2 transmitted to all facilities in geographic area requested by pt/family on 07/15/18     FL2 transmitted to all facilities within larger geographic area on       Patient informed that his/her managed care company has contracts with or will negotiate with certain facilities, including the following:        Yes   Patient/family informed of bed offers received.  Patient chooses bed at [facility] )     Physician recommends and patient chooses bed at      Patient to be transferred to [facility] ) on 07/18/18.  Patient to be transferred to facility by Kelby Aline will transport. )     Patient family notified on 07/18/18 of transfer.  Name of family member notified:  (CSW left patinet's daughter Carl Yang a Engineer, technical sales. ) Patient's daughter  Carl Yang called CSW back and is aware of D/C today.    PHYSICIAN       Additional Comment:    _______________________________________________ Hardie Veltre, Darleen Crocker, LCSW 07/18/2018, 11:41 AM

## 2018-07-18 NOTE — Progress Notes (Signed)
ANTICOAGULATION CONSULT NOTE - Follow up [place] for warfarin Indication: atrial fibrillation per consult, Hx of DVT  No Known Allergies  Patient Measurements: Height: 6\' 3"  (190.5 cm) Weight: 228 lb 6 oz (103.6 kg) IBW/kg (Calculated) : 84.5  Vital Signs: Temp: 98.3 F (36.8 C) (11/01 0820) Temp Source: Oral (11/01 0820) BP: 161/82 (11/01 0820) Pulse Rate: 86 (11/01 0820)  Labs: Recent Labs    07/15/18 1249 07/16/18 0334 07/17/18 0622 07/18/18 0550  HGB 13.4 12.1* 11.8*  --   HCT 42.1 38.4* 36.6*  --   PLT 133* 125* 117*  --   LABPROT 13.7 14.3 16.7* 16.9*  INR 1.06 1.12 1.37 1.39  CREATININE  --  1.39* 1.19 1.23    Estimated Creatinine Clearance: 67.6 mL/min (by C-G formula based on SCr of 1.23 mg/dL).  Assessment: 77 yo male s/p Right TKA to restart warfarin and history of A. Fib and DVT Home dose changed on 10/22 by IM at [facility].  Home Regimen: Warfarin 3mg  Wed,Sat,Sun                             Warfarin 4mg  Osker Mason, Friday   DATE INR DOSE 10/29 1.06 6mg  10/30 1.12     4mg  10/31   1.37 4 mg 11/1  1.39   Goal of Therapy:  INR 2-3 Monitor platelets by anticoagulation protocol: Yes   Plan:  INR is still subtherapeutic but trending up, will continue with patients home regimen (due for 4 mg tonight) as pt has discharge orders at this time.  Enoxaparin 40mg  ordered daily - will need to discontinue once INR therapeutic.   Will order daily INR Will need CBC at least every three days per policy   Pharmacy will continue to follow  Rayna Sexton, PharmD, BCPS Clinical Pharmacist 07/18/2018 9:24 AM

## 2018-07-18 NOTE — Care Management Important Message (Signed)
Important Message  Patient Details  Name: Carl Yang MRN: [id number] Date of Birth: 1940/12/31   Medicare Important Message Given:  Yes    Juliann Pulse A Blu Lori 07/18/2018, 11:16 AM

## 2018-07-18 NOTE — Progress Notes (Signed)
Physical Therapy Treatment Patient Details Name: Carl Yang MRN: [id number] DOB: Sep 15, 1941 Today's Date: 07/18/2018    History of Present Illness Patient is a 77 year old male s/p R TKA.  PMH includes PVD, NM disorder, DVT, DM and CAD.    PT Comments    Pt requiring min assist for R LE semi-supine to sit (pt requesting assist for R LE; pt educated on ways to assist R LE in/out of bed).  Increased effort to stand from bed with RW (pt reports surfaces at home are firmer and have more support than furniture at hospital).  Able to ambulate around nursing loop with RW CGA; pt steady; no knee buckling noted.  4/10 R knee pain beginning/end of session and 5/10 with ambulation.  R knee flexion to 97 degrees today.  Will continue to progress pt with strengthening, knee ROM, and progressive functional mobility during pt's hospital stay.    Follow Up Recommendations  Home health PT;Supervision for mobility/OOB     Equipment Recommendations  Rolling walker with 5" wheels    Recommendations for Other Services       Precautions / Restrictions Precautions Precautions: Knee Precaution Booklet Issued: Yes (comment) Restrictions Weight Bearing Restrictions: Yes RLE Weight Bearing: Weight bearing as tolerated    Mobility  Bed Mobility Overal bed mobility: Needs Assistance Bed Mobility: Supine to Sit     Supine to sit: Min assist;HOB elevated     General bed mobility comments: assist for R LE semi-supine to sit (pt requesting physical assist although pt educated on alternative options for assisting R LE in/out of bed)  Transfers Overall transfer level: Needs assistance Equipment used: Rolling walker (2 wheeled) Transfers: Sit to/from Stand Sit to Stand: Min guard         General transfer comment: increased effort and time to stand from bed on own (multiple repetitions to stand)  Ambulation/Gait Ambulation/Gait assistance: Min guard Gait Distance (Feet): 200 Feet Assistive  device: Rolling walker (2 wheeled)   Gait velocity: decreased   General Gait Details: mostly step through gait pattern with mild decreased stance time R LE d/t R knee pain   Stairs             Wheelchair Mobility    Modified Rankin (Stroke Patients Only)       Balance Overall balance assessment: Needs assistance Sitting-balance support: No upper extremity supported;Feet supported Sitting balance-Leahy Scale: Normal Sitting balance - Comments: steady sitting reaching outside BOS   Standing balance support: Single extremity supported Standing balance-Leahy Scale: Poor Standing balance comment: requires at least single UE support for static standing balance                            Cognition Arousal/Alertness: Awake/alert Behavior During Therapy: WFL for tasks assessed/performed Overall Cognitive Status: Within Functional Limits for tasks assessed                                 General Comments: A&O X4      Exercises Total Joint Exercises Long Arc Quad: AAROM;Strengthening;Right;10 reps;Seated Goniometric ROM: R knee extension 5 degrees short of neutral semi-supine in bed; R knee flexion 97 degrees sitting edge of chair General Exercises - Lower Extremity Hip Flexion/Marching: AAROM;Strengthening;Right;10 reps;Seated Other Exercises Other Exercises: Sitting R knee flexion AAROM x3 reps with end range holds x15 seconds    General Comments  Pt agreeable  to PT session.      Pertinent Vitals/Pain Pain Assessment: 0-10 Pain Score: 4  Pain Location: R knee Pain Descriptors / Indicators: Sore Pain Intervention(s): Limited activity within patient's tolerance;Monitored during session;Premedicated before session;Repositioned;Other (comment)(polar care applied and activated)  Vitals (HR and O2 on room air) stable and WFL throughout treatment session.    Home Living                      Prior Function            PT Goals  (current goals can now be found in the care plan section) Acute Rehab PT Goals Patient Stated Goal: to improve R LE strength and overall mobility PT Goal Formulation: With patient Time For Goal Achievement: 08/05/18 Potential to Achieve Goals: Good Additional Goals Additional Goal #1: Pt will demonstrate 0-90+ degrees of R knee extension/flexion to facilitate safe and energy efficient functional mobility. Progress towards PT goals: Progressing toward goals    Frequency    BID      PT Plan Current plan remains appropriate    Co-evaluation              AM-PAC PT "6 Clicks" Daily Activity  Outcome Measure  Difficulty turning over in bed (including adjusting bedclothes, sheets and blankets)?: None Difficulty moving from lying on back to sitting on the side of the bed? : Unable Difficulty sitting down on and standing up from a chair with arms (e.g., wheelchair, bedside commode, etc,.)?: Unable Help needed moving to and from a bed to chair (including a wheelchair)?: A Little Help needed walking in hospital room?: A Little Help needed climbing 3-5 steps with a railing? : A Little 6 Click Score: 15    End of Session Equipment Utilized During Treatment: Gait belt Activity Tolerance: Patient tolerated treatment well Patient left: in chair;with call bell/phone within reach;with chair alarm set;with SCD's reapplied;Other (comment)(B heels elevated via towel rolls; polar care in place and activated) Nurse Communication: Mobility status;Precautions;Weight bearing status PT Visit Diagnosis: Muscle weakness (generalized) (M62.81);Pain Pain - Right/Left: Right Pain - part of body: Knee     Time: 8295-6213 PT Time Calculation (min) (ACUTE ONLY): 38 min  Charges:  $Gait Training: 8-22 mins $Therapeutic Exercise: 8-22 mins $Therapeutic Activity: 8-22 mins                    Hendricks Limes, PT 07/18/18, 10:35 AM [phone]

## 2018-07-18 NOTE — Discharge Instructions (Signed)

## 2018-07-18 NOTE — Progress Notes (Addendum)
Patient is medically stable for D/C to [facility] today. Per [facility] admissions coordinator at [facility] patient can come today to room 352. RN will call report at [phone] and [facility] will transport. Clinical Education officer, museum (CSW) sent D/C orders to [facility] [employer] [facility] via [employer]. Patient is aware of above and will bring his own cpap from home to [facility]. CSW left patient's daughter [name] a Advertising account executive. Please reconsult if future social work needs arise. CSW signing off.   Patient's daughter [name] called CSW back and was made aware of above.   [employer], LCSW [phone]

## 2018-07-18 NOTE — Discharge Summary (Signed)
Physician Discharge Summary  Patient ID: Carl Yang MRN: [id number] DOB/AGE: 10/15/1940 77 y.o.  Admit date: 07/15/2018 Discharge date: 07/18/2018  Admission Diagnoses:  PRIMARY OSTEOARTHRITIS OF RIGHT KNEE  Discharge Diagnoses: Patient Active Problem List   Diagnosis Date Noted  . Status post total knee replacement using cement, right 07/15/2018    Past Medical History:  Diagnosis Date  . Arthritis   . Cancer ([facility])    Melanoma  . Complication of anesthesia    After surgery at [facility], the patient coded during the night.  . Coronary artery disease    Stents  . Diabetes mellitus without complication ([facility])   . DVT (deep venous thrombosis) ([facility])    Right lower limb  . Dyspnea   . GERD (gastroesophageal reflux disease)   . Myocardial infarction ([facility])    1998  . Neuromuscular disorder ([facility])    Peripheral Neuropathy r/t DM  . Peripheral vascular disease ([facility])    Diabetes  . Sleep apnea    Uses CPAP   Transfusion: None.   Consultants (if any):   Discharged Condition: Improved  Hospital Course: Fineas Yang is an 77 y.o. male who was admitted 07/15/2018 with a diagnosis of right knee osteoarthritis and went to the operating room on 07/15/2018 and underwent the above named procedures.    Surgeries: Procedure(s): TOTAL KNEE ARTHROPLASTY on 07/15/2018 Patient tolerated the surgery well. Taken to PACU where she was stabilized and then transferred to the orthopedic floor.  Started on Lovenox 40mg  q 24 hrs and the patient was also continued on his home dosage of warfarin. Foot pumps applied bilaterally at 80 mm. Heels elevated on bed with rolled towels. No evidence of DVT. Negative [place]. Physical therapy started on day #1 for gait training and transfer. OT started day #1 for ADL and assisted devices.  Patient's IV was removed on POD1, foley removed on POD1.  Implants: Right TKA using all-cemented [employer] Vanguard system with a 75 mm PCR femur, an 83 mm tibial  tray with a 10 mm AS E-poly insert, and a 37 x 10 mm all-poly 3-pegged domed patella.  He was given perioperative antibiotics:  Anti-infectives (From admission, onward)   Start     Dose/Rate Route Frequency Ordered Stop   07/15/18 1400  ceFAZolin (ANCEF) IVPB 2g/100 mL premix     2 g 200 mL/hr over 30 Minutes Intravenous Every 6 hours 07/15/18 1158 07/16/18 0315   07/15/18 0704  ceFAZolin (ANCEF) 2-4 GM/100ML-% IVPB    Note to Pharmacy:  Mikey Bussing   : cabinet override      07/15/18 0704 07/15/18 0800   07/14/18 2300  ceFAZolin (ANCEF) IVPB 2g/100 mL premix     2 g 200 mL/hr over 30 Minutes Intravenous  Once 07/14/18 2251 07/15/18 0800    .  He was given sequential compression devices, early ambulation, and Lovenox and Warfarin for DVT prophylaxis.  He benefited maximally from the hospital stay and there were no complications.    Recent vital signs:  Vitals:   07/18/18 0022 07/18/18 0032  BP: (!) 163/79   Pulse: 71   Resp: 16   Temp: 98.3 F (36.8 C)   SpO2:  91%    Recent laboratory studies:  Lab Results  Component Value Date   HGB 11.8 (L) 07/17/2018   HGB 12.1 (L) 07/16/2018   HGB 13.4 07/15/2018   Lab Results  Component Value Date   WBC 8.8 07/17/2018   PLT 117 (L) 07/17/2018   Lab  Results  Component Value Date   INR 1.39 07/18/2018   Lab Results  Component Value Date   NA 136 07/18/2018   K 4.7 07/18/2018   CL 104 07/18/2018   CO2 24 07/18/2018   BUN 20 07/18/2018   CREATININE 1.23 07/18/2018   GLUCOSE 108 (H) 07/18/2018    Discharge Medications:   Allergies as of 07/18/2018   No Known Allergies     Medication List    TAKE these medications   atorvastatin 80 MG tablet Commonly known as:  LIPITOR Take 80 mg by mouth daily.   CENTRUM SILVER 50+MEN PO Take 1 tablet by mouth daily.   docusate sodium 100 MG capsule Commonly known as:  COLACE Take 100 mg by mouth daily.   enoxaparin 40 MG/0.4ML injection Commonly known as:   LOVENOX Inject 0.4 mLs (40 mg total) into the skin daily.   esomeprazole 40 MG capsule Commonly known as:  NEXIUM Take 40 mg by mouth daily.   fexofenadine 180 MG tablet Commonly known as:  ALLEGRA Take 180 mg by mouth daily.   HUMALOG 100 UNIT/ML injection Generic drug:  insulin lispro Inject 150 Units into the skin continuous. Via pump   metFORMIN 500 MG 24 hr tablet Commonly known as:  GLUCOPHAGE-XR Take 500 mg by mouth 2 (two) times daily.   OVER THE COUNTER MEDICATION Apply 1 application topically daily as needed (pain). CBD Oil   oxyCODONE 5 MG immediate release tablet Commonly known as:  Oxy IR/ROXICODONE Take 1-2 tablets (5-10 mg total) by mouth every 4 (four) hours as needed for moderate pain.   ramipril 2.5 MG capsule Commonly known as:  ALTACE Take 2.5 mg by mouth daily.   traMADol 50 MG tablet Commonly known as:  ULTRAM Take 1 tablet (50 mg total) by mouth every 6 (six) hours as needed for moderate pain.   warfarin 3 MG tablet Commonly known as:  COUMADIN Take 3 mg by mouth daily. Patient takes 3mg  Wed,Sat, Sun   warfarin 4 MG tablet Commonly known as:  COUMADIN Take 4 mg by mouth daily. Patient takes 4mg  Mon, Tues, Thurs, Fri            Durable Medical Equipment  (From admission, onward)         Start     Ordered   07/15/18 1159  DME Bedside commode  Once    Question:  Patient needs a bedside commode to treat with the following condition  Answer:  Status post total knee replacement using cement, right   07/15/18 1158   07/15/18 1159  DME 3 n 1  Once     07/15/18 1158   07/15/18 1159  DME Walker rolling  Once    Question:  Patient needs a walker to treat with the following condition  Answer:  Status post total knee replacement using cement, right   07/15/18 1158          Diagnostic Studies: Dg Chest 2 View  Result Date: 07/03/2018 CLINICAL DATA:  Preoperative examination. History of coronary artery disease, diabetes, peripheral vascular  disease. EXAM: CHEST - 2 VIEW COMPARISON:  Chest x-ray and chest CT scan of July 20, 2018 FINDINGS: The lungs are adequately inflated. The interstitial markings are mildly prominent though stable. There is no alveolar infiltrate or pleural effusion. The heart is top-normal in size but stable. The pulmonary vascularity is normal. There is calcification in the wall of the thoracic aorta. There is a small hiatal hernia. There is prominent  thoracic kyphosis with mild multilevel degenerative disc disease. IMPRESSION: Mild chronic bronchitic changes.  No alveolar pneumonia nor CHF. Small hiatal hernia. Thoracic aortic atherosclerosis. Electronically Signed   By: David  [place]az[place] M.D.   On: 07/03/2018 07:24   Mr Knee Right Wo Contrast  Result Date: 06/25/2018 CLINICAL DATA:  No known injury, chronic knee pain EXAM: MRI OF THE RIGHT KNEE WITHOUT CONTRAST TECHNIQUE: Multiplanar, multisequence MR imaging of the knee was performed. No intravenous contrast was administered. COMPARISON:  None. FINDINGS: MENISCI Medial meniscus: Oblique tear of the posterior horn-body junction of the medial meniscus extending to the inferior articular surface. Lateral meniscus: Oblique tear of the posterior horn of lateral meniscus extending to the inferior articular surface. Radial tear of the body of the lateral meniscus. Maceration of the anterior horn of the lateral meniscus. LIGAMENTS Cruciates:  Intact ACL and PCL. Collaterals: Medial collateral ligament is intact. Lateral collateral ligament complex is intact. CARTILAGE Patellofemoral: Partial-thickness cartilage loss of the patellofemoral compartment with areas of full-thickness cartilage loss of the lateral patellar facet with mild subchondral reactive marrow changes. Medial: Extensive full-thickness cartilage loss of the medial femoral condyle and medial tibial plateau with subchondral reactive marrow edema and marginal osteophytes. Lateral: Mild partial-thickness cartilage loss of  the lateral femorotibial compartment with marginal osteophytes. Joint: Large joint effusion. Normal Hoffa's fat. No plical thickening. Popliteal Fossa:  No Baker's cyst.  Intact popliteus tendon. Extensor Mechanism: Intact quadriceps tendon. Intact patellar tendon. Intact medial patellar retinaculum. Intact lateral patellar retinaculum. Intact MPFL. Bones: No acute osseous abnormality. No aggressive osseous lesion. Osteoarthritis of the proximal tibiofibular joint. Other: No fluid collection or hematoma. IMPRESSION: 1. Oblique tear of the posterior horn-body junction of the medial meniscus extending to the inferior articular surface. 2. Oblique tear of the posterior horn of lateral meniscus extending to the inferior articular surface. Radial tear of the body of the lateral meniscus. Maceration of the anterior horn of the lateral meniscus. 3. Tricompartmental cartilage abnormalities as described above. Electronically Signed   By: Elige Ko   On: 06/25/2018 13:08   Dg Knee Right Port  Result Date: 07/15/2018 CLINICAL DATA:  77 year old male post total right knee replacement. Subsequent encounter. EXAM: PORTABLE RIGHT KNEE - 1-2 VIEW COMPARISON:  06/25/2018 MR. FINDINGS: Post total right knee replacement which appears in satisfactory position without complication noted. IMPRESSION: Post total right knee replacement. Electronically Signed   By: Lacy Duverney M.D.   On: 07/15/2018 10:44   Disposition: Plan will be for discharge to [facility] today pending a bowel movement.  Upon discharge from hospital patient will need to continue Lovenox 40mg  daily in addition to Warfarin until therapeutic.  Daily PT/INR lab draws until INR is above 2, at this time can d/c Lovenox.  CBC every 3 days.   Contact information for follow-up providers    Anson [place], PA-C Follow up in 14 day(s).   Specialty:  Physician Assistant Why:  Wenda [facility] [employer] Contact information: [address] [place] [address] [phone]            Contact information for after-discharge care    Destination    [facility] Preferred SNF .   Service:  Skilled Nursing Contact information: [address] [place] [address] [phone]                 Signed: Meriel Pica PA-C 07/18/2018, 7:47 AM

## 2018-07-18 NOTE — Progress Notes (Signed)
Report called to [place] at [facility]. IV removed. [place] dressing changed. Tech assisting pt to get dressed and packed. [facility] will pick up pt and transport.

## 2018-07-22 ENCOUNTER — Other Ambulatory Visit: Payer: Self-pay | Admitting: Adult Health

## 2018-07-22 ENCOUNTER — Other Ambulatory Visit
Admission: RE | Admit: 2018-07-22 | Discharge: 2018-07-22 | Disposition: A | Discharge status: Home / Self Care | Payer: Medicare Other | Source: Ambulatory Visit | Attending: Adult Health | Admitting: Adult Health

## 2018-07-22 DIAGNOSIS — I82409 Acute embolism and thrombosis of unspecified deep veins of unspecified lower extremity: Secondary | ICD-10-CM | POA: Insufficient documentation

## 2018-07-22 LAB — PROTIME-INR
INR: 1.93
PROTHROMBIN TIME: 21.8 s — AB (ref 11.4–15.2)

## 2018-07-22 MED ORDER — OXYCODONE HCL 5 MG PO TABS: 5.0000 mg | ORAL_TABLET | ORAL | 0 refills | Status: DC | PRN

## 2018-07-22 MED ORDER — TRAMADOL HCL 50 MG PO TABS: 50.0000 mg | ORAL_TABLET | Freq: Four times a day (QID) | ORAL | 0 refills | Status: DC | PRN

## 2018-07-23 ENCOUNTER — Other Ambulatory Visit
Admission: RE | Admit: 2018-07-23 | Discharge: 2018-07-23 | Disposition: A | Discharge status: Home / Self Care | Payer: Medicare Other | Source: Ambulatory Visit | Attending: Internal Medicine | Admitting: Internal Medicine

## 2018-07-23 DIAGNOSIS — I4891 Unspecified atrial fibrillation: Secondary | ICD-10-CM | POA: Insufficient documentation

## 2018-07-23 LAB — PROTIME-INR
INR: 2.02
Prothrombin Time: 22.6 seconds — ABNORMAL HIGH (ref 11.4–15.2)

## 2018-07-25 ENCOUNTER — Other Ambulatory Visit
Admission: RE | Admit: 2018-07-25 | Discharge: 2018-07-25 | Disposition: A | Discharge status: Home / Self Care | Payer: Medicare Other | Source: Skilled Nursing Facility | Attending: Internal Medicine | Admitting: Internal Medicine

## 2018-07-25 DIAGNOSIS — I82409 Acute embolism and thrombosis of unspecified deep veins of unspecified lower extremity: Secondary | ICD-10-CM | POA: Insufficient documentation

## 2018-07-25 LAB — PROTIME-INR
INR: 2
Prothrombin Time: 22.4 seconds — ABNORMAL HIGH (ref 11.4–15.2)

## 2018-07-28 ENCOUNTER — Encounter: Payer: Self-pay | Admitting: Adult Health

## 2018-07-28 ENCOUNTER — Non-Acute Institutional Stay (SKILLED_NURSING_FACILITY): Payer: Medicare Other | Admitting: Adult Health

## 2018-07-28 DIAGNOSIS — Z9989 Dependence on other enabling machines and devices: Secondary | ICD-10-CM

## 2018-07-28 DIAGNOSIS — Z96651 Presence of right artificial knee joint: Secondary | ICD-10-CM | POA: Diagnosis not present

## 2018-07-28 DIAGNOSIS — I152 Hypertension secondary to endocrine disorders: Secondary | ICD-10-CM | POA: Insufficient documentation

## 2018-07-28 DIAGNOSIS — I825Y1 Chronic embolism and thrombosis of unspecified deep veins of right proximal lower extremity: Secondary | ICD-10-CM

## 2018-07-28 DIAGNOSIS — J3089 Other allergic rhinitis: Secondary | ICD-10-CM

## 2018-07-28 DIAGNOSIS — E1069 Type 1 diabetes mellitus with other specified complication: Secondary | ICD-10-CM | POA: Diagnosis not present

## 2018-07-28 DIAGNOSIS — E785 Hyperlipidemia, unspecified: Secondary | ICD-10-CM

## 2018-07-28 DIAGNOSIS — E1042 Type 1 diabetes mellitus with diabetic polyneuropathy: Secondary | ICD-10-CM | POA: Diagnosis not present

## 2018-07-28 DIAGNOSIS — I10 Essential (primary) hypertension: Secondary | ICD-10-CM | POA: Insufficient documentation

## 2018-07-28 DIAGNOSIS — K219 Gastro-esophageal reflux disease without esophagitis: Secondary | ICD-10-CM | POA: Insufficient documentation

## 2018-07-28 DIAGNOSIS — I82501 Chronic embolism and thrombosis of unspecified deep veins of right lower extremity: Secondary | ICD-10-CM | POA: Insufficient documentation

## 2018-07-28 DIAGNOSIS — K5909 Other constipation: Secondary | ICD-10-CM | POA: Insufficient documentation

## 2018-07-28 DIAGNOSIS — G4733 Obstructive sleep apnea (adult) (pediatric): Secondary | ICD-10-CM | POA: Insufficient documentation

## 2018-07-28 DIAGNOSIS — E1059 Type 1 diabetes mellitus with other circulatory complications: Secondary | ICD-10-CM | POA: Diagnosis not present

## 2018-07-28 DIAGNOSIS — M1711 Unilateral primary osteoarthritis, right knee: Secondary | ICD-10-CM

## 2018-07-28 NOTE — Progress Notes (Signed)
Location:   [place] [facility] Room Number: [place] of Service:  SNF (31)   CODE STATUS:  Full code  No Known Allergies  Chief Complaint  Patient presents with  . 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 Weekly follow up for the first 30 days post hospitalization.     HPI:  He is short term patient of this facility being seen for the management of his chronic illnesses: dyslipidemia; diabetes; hypertension. His cbg readings have been elevated; he is on an insulin pump. He does not want to make changes at this time; as his diet is not as strict as when he is at home. He denies any uncontrolled pain; no changes in appetite; no excessive thirst or hunger.   Past Medical History:  Diagnosis Date  . Arthritis   . Cancer ([place])    Melanoma  . Complication of anesthesia    After surgery at [facility], the patient coded during the night.  . Coronary artery disease    Stents  . Diabetes mellitus without complication ([place])   . DVT (deep venous thrombosis) (H[facility])    Right lower limb  . Dyspnea   . GERD (gastroesophageal reflux disease)   . Myocardial infarction ([place])    1998  . Neuromuscular disorder ([place])    Peripheral Neuropathy r/t DM  . Peripheral vascular disease ([place])    Diabetes  . Sleep apnea    Uses CPAP    Past Surgical History:  Procedure Laterality Date  . 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  Marland Kitchen BACK SURGERY     L4 & L5 Fusion  . CARDIAC CATHETERIZATION    . DIAGNOSTIC LAPAROSCOPY    . EYE SURGERY    . TONSILLECTOMY    . TOTAL KNEE ARTHROPLASTY Right 07/15/2018   Procedure: TOTAL KNEE ARTHROPLASTY;  Surgeon: Corky Mull, MD;  Location: [facility] ORS;  Service: Orthopedics;  Laterality: Right;    Social History    Socioeconomic History  . Marital status: Widowed    Spouse name: Not on file  . Number of children: Not on file  . Years of education: Not on file  . Highest education level: Not on file  Occupational History  . Not on file  Social Needs  . Financial resource strain: Not on file  . Food insecurity:    Worry: Not on file    Inability: Not on file  . Transportation needs:    Medical: Not on file    Non-medical: Not on file  Tobacco Use  . Smoking status: Never Smoker  . Smokeless tobacco: Never Used  Substance and Sexual Activity  . Alcohol use: Yes    Alcohol/week: 1.0 standard drinks    Types: 1 Glasses of wine per week    Comment: Socially  . Drug use: Never  . Sexual activity: Not Currently  Lifestyle  . Physical activity:    Days per week: Not on file    Minutes per session: Not on file  . Stress: Not on file  Relationships  . Social connections:    Talks on phone: Not on file    Gets together: Not on file    Attends religious service: Not on file    Active member of club or  organization: Not on file    Attends meetings of clubs or organizations: Not on file    Relationship status: Not on file  . Intimate partner violence:    Fear of current or ex partner: Not on file    Emotionally abused: Not on file    Physically abused: Not on file    Forced sexual activity: Not on file  Other Topics Concern  . Not on file  Social History Narrative  . Not on file   History reviewed. No pertinent family history.    VITAL SIGNS BP (!) 93/53   Pulse 93   Temp 97.8 F (36.6 C)   Resp 20   Ht 6\' 3"  (1.905 m)   Wt 231 lb 6.4 oz (105 kg)   SpO2 99%   BMI 28.92 kg/m   Outpatient Encounter Medications as of 07/28/2018  Medication Sig  . acetaminophen (TYLENOL) 325 MG tablet Take 650 mg by mouth every 4 (four) hours as needed.  Marland Kitchen atorvastatin (LIPITOR) 80 MG tablet Take 80 mg by mouth daily.  Marland Kitchen docusate sodium (COLACE) 100 MG capsule Take 100 mg by mouth daily.   Marland Kitchen enoxaparin (LOVENOX) 40 MG/0.4ML injection Inject 0.4 mLs (40 mg total) into the skin daily.  Marland Kitchen esomeprazole (NEXIUM) 40 MG capsule Take 40 mg by mouth daily.  . fexofenadine (ALLEGRA) 180 MG tablet Take 180 mg by mouth daily.  Marland Kitchen HUMALOG 100 UNIT/ML injection Inject 150 Units into the skin continuous. Via pump  . metFORMIN (GLUCOPHAGE-XR) 500 MG 24 hr tablet Take 500 mg by mouth 2 (two) times daily.  . Multiple Vitamins-Minerals (CENTRUM SILVER 50+MEN PO) Take 1 tablet by mouth daily.  . NON FORMULARY Diet Type:  Heart Healthy  . oxyCODONE (OXY IR/ROXICODONE) 5 MG immediate release tablet Take 1-2 tablets (5-10 mg total) by mouth every 4 (four) hours as needed for moderate pain.  . ramipril (ALTACE) 2.5 MG capsule Take 2.5 mg by mouth daily.  Marland Kitchen senna (SENOKOT) 8.6 MG TABS tablet Take 2 tablets by mouth 2 (two) times daily.  . traMADol (ULTRAM) 50 MG tablet Take 1 tablet (50 mg total) by mouth every 6 (six) hours as needed for moderate pain.  Marland Kitchen UNABLE TO FIND CPAP @@ bedtime  . warfarin (COUMADIN) 3 MG tablet Take 3 mg by mouth daily. Patient takes 3mg  Wed,Sat, Sun  . warfarin (COUMADIN) 4 MG tablet Take 4 mg by mouth daily. Patient takes 4mg  Mon, Tues, Thurs, Fri  . [DISCONTINUED] OVER THE COUNTER MEDICATION Apply 1 application topically daily as needed (pain). CBD Oil   No facility-administered encounter medications on file as of 07/28/2018.      SIGNIFICANT DIAGNOSTIC EXAMS   LABS REVIEWED TODAY:   07-17-18: wbc 8.8; hgb 11.8' hct 36.6; mcv 95.8; plt 117 07-18-18: glucose 108; bun 20; creat 1.23; k+ 4.7; na+ + 138; ca 8.6 07-25-18: INR 2.00  Review of Systems  Constitutional: Negative for malaise/fatigue.  Respiratory: Negative for cough and shortness of breath.   Cardiovascular: Negative for chest pain, palpitations and leg swelling.  Gastrointestinal: Negative for abdominal pain, constipation and heartburn.  Musculoskeletal: Negative for back pain, joint pain and myalgias.   Skin: Negative.   Neurological: Negative for dizziness.  Psychiatric/Behavioral: The patient is not nervous/anxious.     Physical Exam  Constitutional: He is oriented to person, place, and time. He appears well-developed and well-nourished. No distress.  Neck: No thyromegaly present.  Cardiovascular: Normal rate, regular rhythm, normal heart sounds and intact distal pulses.  Pulmonary/Chest: Effort normal and breath sounds normal. No respiratory distress.  Abdominal: Soft. Bowel sounds are normal. He exhibits no distension. There is no tenderness.  Musculoskeletal: He exhibits no edema.  Is able to move all extremities Status post left transmetatarsal amputation  Status post right knee replacement   Lymphadenopathy:    He has no cervical adenopathy.  Neurological: He is alert and oriented to person, place, and time.  Skin: Skin is warm and dry. He is not diaphoretic.  Right knee without signs of infection present   Psychiatric: He has a normal mood and affect.      ASSESSMENT/ PLAN:  TODAY:   1. 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 Hypertension associated with type 1 diabetes mellitus: is stable b/p 93/53 will continue altace 2.5 mg daily   4. OSA on CPAP is stable  5. GERD without esophagitis: is stable will continue nexium 40 mg daily   6. Chronic non seasonal allergic rhinitis: is stable will continue allegra 180 mg daily   7. Chronic constipation: is stable will continue colace twice daily and senna 2 tabs twice daily   8.  Primary osteoarthritis of right knee: status post right knee replacement: is stable will continue therapy as directed and will follow up with orthopedics; will continue ultram 50 mg every 6 hours as needed  and oxycodone 5 mg 1 or 2 tabs every 4 hours as needed   9. Chronic dvt right lower extremity: is stable is on long term coumadin therapy will stop lovenox as his INR is therapeutic       MD is aware of resident's narcotic use and is in agreement with current plan of care. We will attempt to wean resident as apropriate   Ok Edwards NP [facility]  Contact [phone] Monday through Friday 8am- 5pm  After hours call [phone]

## 2018-07-29 ENCOUNTER — Encounter: Payer: Self-pay | Admitting: Adult Health

## 2018-07-29 ENCOUNTER — Other Ambulatory Visit
Admission: RE | Admit: 2018-07-29 | Discharge: 2018-07-29 | Disposition: A | Discharge status: Home / Self Care | Payer: No Typology Code available for payment source | Source: Ambulatory Visit | Attending: Adult Health | Admitting: Adult Health

## 2018-07-29 DIAGNOSIS — I82409 Acute embolism and thrombosis of unspecified deep veins of unspecified lower extremity: Secondary | ICD-10-CM | POA: Insufficient documentation

## 2018-07-29 LAB — PROTIME-INR
INR: 2.6
Prothrombin Time: 27.5 seconds — ABNORMAL HIGH (ref 11.4–15.2)

## 2018-07-29 NOTE — Progress Notes (Signed)
Entered in error

## 2018-07-30 ENCOUNTER — Other Ambulatory Visit: Payer: Self-pay | Admitting: Adult Health

## 2018-07-30 ENCOUNTER — Non-Acute Institutional Stay (SKILLED_NURSING_FACILITY): Payer: Medicare Other | Admitting: Adult Health

## 2018-07-30 ENCOUNTER — Other Ambulatory Visit
Admission: RE | Admit: 2018-07-30 | Discharge: 2018-07-30 | Disposition: A | Discharge status: Home / Self Care | Payer: Medicare Other | Source: Ambulatory Visit | Attending: Internal Medicine | Admitting: Internal Medicine

## 2018-07-30 DIAGNOSIS — Z96651 Presence of right artificial knee joint: Secondary | ICD-10-CM | POA: Diagnosis not present

## 2018-07-30 DIAGNOSIS — E1042 Type 1 diabetes mellitus with diabetic polyneuropathy: Secondary | ICD-10-CM

## 2018-07-30 DIAGNOSIS — I82409 Acute embolism and thrombosis of unspecified deep veins of unspecified lower extremity: Secondary | ICD-10-CM | POA: Diagnosis present

## 2018-07-30 DIAGNOSIS — M1711 Unilateral primary osteoarthritis, right knee: Secondary | ICD-10-CM

## 2018-07-30 LAB — PROTIME-INR
INR: 2.22
PROTHROMBIN TIME: 24.3 s — AB (ref 11.4–15.2)

## 2018-07-30 MED ORDER — METFORMIN HCL ER 500 MG PO TB24: 500.0000 mg | ORAL_TABLET | Freq: Two times a day (BID) | ORAL | 0 refills | Status: DC

## 2018-07-30 MED ORDER — ESOMEPRAZOLE MAGNESIUM 40 MG PO CPDR: 40.0000 mg | DELAYED_RELEASE_CAPSULE | Freq: Every day | ORAL | 0 refills | Status: AC

## 2018-07-30 MED ORDER — RAMIPRIL 2.5 MG PO CAPS: 2.5000 mg | ORAL_CAPSULE | Freq: Every day | ORAL | 0 refills | Status: DC

## 2018-07-30 MED ORDER — ATORVASTATIN CALCIUM 80 MG PO TABS: 80.0000 mg | ORAL_TABLET | Freq: Every day | ORAL | 0 refills | Status: DC

## 2018-07-30 MED ORDER — HUMALOG 100 UNIT/ML ~~LOC~~ SOLN: 150.0000 [IU] | SUBCUTANEOUS | 0 refills | Status: DC

## 2018-07-30 MED ORDER — OXYCODONE HCL 5 MG PO TABS: 5.0000 mg | ORAL_TABLET | ORAL | 0 refills | Status: DC | PRN

## 2018-07-30 MED ORDER — TRAMADOL HCL 50 MG PO TABS: 50.0000 mg | ORAL_TABLET | Freq: Four times a day (QID) | ORAL | 0 refills | Status: DC | PRN

## 2018-07-30 MED ORDER — WARFARIN SODIUM 3 MG PO TABS: 3.0000 mg | ORAL_TABLET | Freq: Every day | ORAL | 0 refills | Status: DC

## 2018-07-30 MED ORDER — WARFARIN SODIUM 4 MG PO TABS: 4.0000 mg | ORAL_TABLET | Freq: Every day | ORAL | 0 refills | Status: DC

## 2018-07-30 NOTE — Progress Notes (Signed)
Location:   [place] Room Number: 629 Place of Service:  SNF (31)    CODE STATUS: full code   No Known Allergies  Chief Complaint  Patient presents with  . Discharge Note    dishchage on 08-01-18    HPI:  He is being discharged to home with home health for pt/ot. He will need a 3:1 commode and a front wheel walker. He will need his prescriptions written and will need to follow up with his medical provider. He had been hospitalized for a right knee replacement; was admitted to this facility for short term rehab and is now ready for discharge to home.    Past Medical History:  Diagnosis Date  . Arthritis   . Cancer ([place])    Melanoma  . Complication of anesthesia    After surgery at [facility], the patient coded during the night.  . Coronary artery disease    Stents  . Diabetes mellitus without complication ([place])   . DVT (deep venous thrombosis) ([facility])    Right lower limb  . Dyspnea   . GERD (gastroesophageal reflux disease)   . Myocardial infarction ([place])    1998  . Neuromuscular disorder ([place])    Peripheral Neuropathy r/t DM  . Peripheral vascular disease ([place])    Diabetes  . Sleep apnea    Uses CPAP    Past Surgical History:  Procedure Laterality Date  . 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  Marland Kitchen BACK SURGERY     L4 & L5 Fusion  . CARDIAC CATHETERIZATION    . DIAGNOSTIC LAPAROSCOPY    . EYE SURGERY    . TONSILLECTOMY    . TOTAL KNEE ARTHROPLASTY Right 07/15/2018   Procedure: TOTAL KNEE ARTHROPLASTY;  Surgeon: Corky Mull, MD;  Location: [facility] ORS;  Service: Orthopedics;  Laterality: Right;    Social History   Socioeconomic History  . Marital status: Widowed    Spouse name: Not on file  . Number of children: Not on file  . Years of education: Not on file  . Highest education level: Not on file  Occupational History  . Not on file  Social Needs  . Financial resource strain: Not on file  . Food insecurity:   Worry: Not on file    Inability: Not on file  . Transportation needs:    Medical: Not on file    Non-medical: Not on file  Tobacco Use  . Smoking status: Never Smoker  . Smokeless tobacco: Never Used  Substance and Sexual Activity  . Alcohol use: Yes    Alcohol/week: 1.0 standard drinks    Types: 1 Glasses of wine per week    Comment: Socially  . Drug use: Never  . Sexual activity: Not Currently  Lifestyle  . Physical activity:    Days per week: Not on file    Minutes per session: Not on file  . Stress: Not on file  Relationships  . Social connections:    Talks on phone: Not on file    Gets together: Not on file    Attends religious service: Not on file    Active member of club or organization: Not on file    Attends meetings of clubs or organizations: Not on file    Relationship status: Not on file  . Intimate partner violence:    Fear of current or ex partner: Not on file    Emotionally abused: Not  on file    Physically abused: Not on file    Forced sexual activity: Not on file  Other Topics Concern  . Not on file  Social History Narrative  . Not on file   No family history on file.  VITAL SIGNS BP 110/68   Pulse 70   Temp 97.8 F (36.6 C)   Resp 16   Ht 6\' 3"  (1.905 m)   Wt 225 lb (102.1 kg)   SpO2 99%   BMI 28.12 kg/m   Patient's Medications  New Prescriptions   No medications on file  Previous Medications   ACETAMINOPHEN (TYLENOL) 325 MG TABLET    Take 650 mg by mouth every 4 (four) hours as needed.   ATORVASTATIN (LIPITOR) 80 MG TABLET    Take 80 mg by mouth daily.   DOCUSATE SODIUM (COLACE) 100 MG CAPSULE    Take 100 mg by mouth daily.   ESOMEPRAZOLE (NEXIUM) 40 MG CAPSULE    Take 40 mg by mouth daily.   FEXOFENADINE (ALLEGRA) 180 MG TABLET    Take 180 mg by mouth daily.   HUMALOG 100 UNIT/ML INJECTION    Inject 150 Units into the skin continuous. Via pump   METFORMIN (GLUCOPHAGE-XR) 500 MG 24 HR TABLET    Take 500 mg by mouth 2 (two) times daily.     MULTIPLE VITAMINS-MINERALS (CENTRUM SILVER 50+MEN PO)    Take 1 tablet by mouth daily.   NON FORMULARY    Diet Type:  Heart Healthy   OXYCODONE (OXY IR/ROXICODONE) 5 MG IMMEDIATE RELEASE TABLET    Take 1-2 tablets (5-10 mg total) by mouth every 4 (four) hours as needed for moderate pain.   RAMIPRIL (ALTACE) 2.5 MG CAPSULE    Take 2.5 mg by mouth daily.   SENNA (SENOKOT) 8.6 MG TABS TABLET    Take 2 tablets by mouth 2 (two) times daily.   TRAMADOL (ULTRAM) 50 MG TABLET    Take 1 tablet (50 mg total) by mouth every 6 (six) hours as needed for moderate pain.   UNABLE TO FIND    CPAP @@ bedtime   WARFARIN (COUMADIN) 3 MG TABLET    Take 3 mg by mouth daily. Patient takes 3mg  Wed,Sat, Sun   WARFARIN (COUMADIN) 4 MG TABLET    Take 4 mg by mouth daily. Patient takes 4mg  Mon, Tues, Thurs, Fri  Modified Medications   No medications on file  Discontinued Medications   No medications on file     SIGNIFICANT DIAGNOSTIC EXAMS   LABS REVIEWED PREVIOUS:   07-17-18: wbc 8.8; hgb 11.8' hct 36.6; mcv 95.8; plt 117 07-18-18: glucose 108; bun 20; creat 1.23; k+ 4.7; na+ + 138; ca 8.6 07-25-18: INR 2.00  TODAY:   07-29-18: INR  2.6    Review of Systems  Constitutional: Negative for malaise/fatigue.  Respiratory: Negative for cough and shortness of breath.   Cardiovascular: Negative for chest pain, palpitations and leg swelling.  Gastrointestinal: Negative for abdominal pain, constipation and heartburn.  Musculoskeletal: Negative for back pain, joint pain and myalgias.  Skin: Negative.   Neurological: Negative for dizziness.  Psychiatric/Behavioral: The patient is not nervous/anxious.     Physical Exam  Constitutional: He is oriented to person, place, and time. He appears well-developed and well-nourished. No distress.  Neck: No thyromegaly present.  Cardiovascular: Normal rate, regular rhythm, normal heart sounds and intact distal pulses.  Pulmonary/Chest: Effort normal and breath sounds  normal. No respiratory distress.  Abdominal: Soft. Bowel sounds are  normal. He exhibits no distension. There is no tenderness.  Musculoskeletal: He exhibits no edema.  Is able to move all extremities Status post left transmetatarsal amputation  Status post right knee replacement    Lymphadenopathy:    He has no cervical adenopathy.  Neurological: He is alert and oriented to person, place, and time.  Skin: Skin is warm and dry. He is not diaphoretic.  Right knee incision line without signs of infection   Psychiatric: He has a normal mood and affect.     ASSESSMENT/ PLAN:   Patient is being discharged with the following home health services:pt/ot to evaluate and treat as indicated for gait balance strength adl training   Patient is being discharged with the following durable medical equipment:  3:1 commode. Front wheel walker to allow him to maintain his current level of independence with his adls.   Patient has been advised to f/u with their PCP in 1-2 weeks to bring them up to date on their rehab stay.  Social services at facility was responsible for arranging this appointment.  Pt was provided with a 30 day supply of prescriptions for medications and refills must be obtained from their PCP.  For controlled substances, a more limited supply may be provided adequate until PCP appointment only.   A 30 day supply of prescription medications as listed above have been sent to [facility] on [facility] [address] oxycodone 5 mg tabs #20 ultram 50 mg tabs.   Time spent with patient 35 minutes: discussed medications; home health needs and dme: verbalized understanding.    Ok Edwards NP [facility]  Contact [phone] Monday through Friday 8am- 5pm  After hours call [phone]

## 2018-08-26 ENCOUNTER — Other Ambulatory Visit: Payer: Self-pay | Admitting: Adult Health

## 2018-10-02 ENCOUNTER — Other Ambulatory Visit: Payer: Self-pay

## 2018-10-02 ENCOUNTER — Encounter: Payer: Self-pay | Admitting: *Deleted

## 2018-10-02 NOTE — Discharge Instructions (Signed)
[facility] °[facility] ° °POST OPERATIVE INSTRUCTIONS FOR DR. TROXLER AND DR. FOWLER °[facility] ° ° °1. Take your medication as prescribed.  Pain medication should be taken only as needed. ° °2. Keep the dressing clean, dry and intact. ° °3. Keep your foot elevated above the heart level for the first 48 hours. ° °4. Walking to the bathroom and brief periods of walking are acceptable, unless we have instructed you to be non-weight bearing. ° °5. Always wear your post-op shoe when walking.  Always use your crutches if you are to be non-weight bearing. ° °6. Do not take a shower. Baths are permissible as long as the foot is kept out of the water.  ° °7. Every hour you are awake:  °- Bend your knee 15 times. °- Flex foot 15 times °- Massage calf 15 times ° °8. Call [facility] ([phone]) if any of the following problems occur: °- You develop a temperature or fever. °- The bandage becomes saturated with blood. °- Medication does not stop your pain. °- Injury of the foot occurs. °- Any symptoms of infection including redness, odor, or red streaks running from wound. ° ° °General Anesthesia, Adult, Care After °This sheet gives you information about how to care for yourself after your procedure. Your health care provider may also give you more specific instructions. If you have problems or questions, contact your health care provider. °What can I expect after the procedure? °After the procedure, the following side effects are common: °· Pain or discomfort at the IV site. °· Nausea. °· Vomiting. °· Sore throat. °· Trouble concentrating. °· Feeling cold or chills. °· Weak or tired. °· Sleepiness and fatigue. °· Soreness and body aches. These side effects can affect parts of the body that were not involved in surgery. °Follow these instructions at home: ° °For at least 24 hours after the procedure: °· Have a responsible adult stay with you. It is important to  have someone help care for you until you are awake and alert. °· Rest as needed. °· Do not: °? Participate in activities in which you could fall or become injured. °? Drive. °? Use heavy machinery. °? Drink alcohol. °? Take sleeping pills or medicines that cause drowsiness. °? Make important decisions or sign legal documents. °? Take care of children on your own. °Eating and drinking °· Follow any instructions from your health care provider about eating or drinking restrictions. °· When you feel hungry, start by eating small amounts of foods that are soft and easy to digest (bland), such as toast. Gradually return to your regular diet. °· Drink enough fluid to keep your urine pale yellow. °· If you vomit, rehydrate by drinking water, juice, or clear broth. °General instructions °· If you have sleep apnea, surgery and certain medicines can increase your risk for breathing problems. Follow instructions from your health care provider about wearing your sleep device: °? Anytime you are sleeping, including during daytime naps. °? While taking prescription pain medicines, sleeping medicines, or medicines that make you drowsy. °· Return to your normal activities as told by your health care provider. Ask your health care provider what activities are safe for you. °· Take over-the-counter and prescription medicines only as told by your health care provider. °· If you smoke, do not smoke without supervision. °· Keep all follow-up visits as told by your health care provider. This is important. °Contact a health care provider if: °· You have   nausea or vomiting that does not get better with medicine. °· You cannot eat or drink without vomiting. °· You have pain that does not get better with medicine. °· You are unable to pass urine. °· You develop a skin rash. °· You have a fever. °· You have redness around your IV site that gets worse. °Get help right away if: °· You have difficulty breathing. °· You have chest pain. °· You  have blood in your urine or stool, or you vomit blood. °Summary °· After the procedure, it is common to have a sore throat or nausea. It is also common to feel tired. °· Have a responsible adult stay with you for the first 24 hours after general anesthesia. It is important to have someone help care for you until you are awake and alert. °· When you feel hungry, start by eating small amounts of foods that are soft and easy to digest (bland), such as toast. Gradually return to your regular diet. °· Drink enough fluid to keep your urine pale yellow. °· Return to your normal activities as told by your health care provider. Ask your health care provider what activities are safe for you. °This information is not intended to replace advice given to you by your health care provider. Make sure you discuss any questions you have with your health care provider. °Document Released: 12/10/2000 Document Revised: 04/19/2017 Document Reviewed: 04/19/2017 °Elsevier Interactive Patient Education © 2019 [employer] ° °

## 2018-10-02 NOTE — Anesthesia Preprocedure Evaluation (Addendum)
Anesthesia Evaluation  Patient identified by MRN, date of birth, ID band Patient awake    Reviewed: Allergy & Precautions, NPO status , Patient's Chart, lab work & pertinent test results  History of Anesthesia Complications Negative for: history of anesthetic complications  Airway Mallampati: II   Neck ROM: Full    Dental no notable dental hx.    Pulmonary sleep apnea ,    Pulmonary exam normal breath sounds clear to auscultation       Cardiovascular Exercise Tolerance: Good hypertension, + CAD, + Past MI (1998, 2003), + Cardiac Stents (x3 on warfarin; last dose 10/03/18) and + Peripheral Vascular Disease  Normal cardiovascular exam Rhythm:Regular Rate:Normal  Cardiology visit 10/01/17: Low risk. Hold warfarin 5 days prior to procedure and restart as soon as possible after.   Neuro/Psych negative neurological ROS     GI/Hepatic GERD  ,  Endo/Other  diabetes (insulin pump), Type 2, Insulin Dependent  Renal/GU      Musculoskeletal  (+) Arthritis ,   Abdominal   Peds  Hematology negative hematology ROS (+)   Anesthesia Other Findings   Reproductive/Obstetrics                            Anesthesia Physical Anesthesia Plan  ASA: III  Anesthesia Plan: General   Post-op Pain Management:    Induction: Intravenous  PONV Risk Score and Plan: 2 and Propofol infusion and TIVA  Airway Management Planned: Natural Airway  Additional Equipment:   Intra-op Plan:   Post-operative Plan:   Informed Consent: I have reviewed the patients History and Physical, chart, labs and discussed the procedure including the risks, benefits and alternatives for the proposed anesthesia with the patient or authorized representative who has indicated his/her understanding and acceptance.       Plan Discussed with: CRNA  Anesthesia Plan Comments:        Anesthesia Quick Evaluation

## 2018-10-06 DIAGNOSIS — Z8679 Personal history of other diseases of the circulatory system: Secondary | ICD-10-CM | POA: Insufficient documentation

## 2018-10-07 ENCOUNTER — Other Ambulatory Visit: Payer: Self-pay | Admitting: Podiatry

## 2018-10-09 ENCOUNTER — Ambulatory Visit: Payer: Medicare Other | Admitting: Anesthesiology

## 2018-10-09 ENCOUNTER — Encounter
Admission: RE | Disposition: A | Discharge status: Home / Self Care | Payer: Self-pay | Source: Home / Self Care | Attending: Podiatry

## 2018-10-09 ENCOUNTER — Ambulatory Visit
Admission: RE | Admit: 2018-10-09 | Discharge: 2018-10-09 | Disposition: A | Discharge status: Home / Self Care | Payer: Medicare Other | Attending: Podiatry | Admitting: Podiatry

## 2018-10-09 DIAGNOSIS — Z89412 Acquired absence of left great toe: Secondary | ICD-10-CM | POA: Insufficient documentation

## 2018-10-09 DIAGNOSIS — Z89422 Acquired absence of other left toe(s): Secondary | ICD-10-CM | POA: Insufficient documentation

## 2018-10-09 DIAGNOSIS — E11319 Type 2 diabetes mellitus with unspecified diabetic retinopathy without macular edema: Secondary | ICD-10-CM | POA: Insufficient documentation

## 2018-10-09 DIAGNOSIS — I25810 Atherosclerosis of coronary artery bypass graft(s) without angina pectoris: Secondary | ICD-10-CM | POA: Insufficient documentation

## 2018-10-09 DIAGNOSIS — E11621 Type 2 diabetes mellitus with foot ulcer: Secondary | ICD-10-CM | POA: Insufficient documentation

## 2018-10-09 DIAGNOSIS — Z794 Long term (current) use of insulin: Secondary | ICD-10-CM | POA: Insufficient documentation

## 2018-10-09 DIAGNOSIS — I10 Essential (primary) hypertension: Secondary | ICD-10-CM | POA: Diagnosis not present

## 2018-10-09 DIAGNOSIS — I4891 Unspecified atrial fibrillation: Secondary | ICD-10-CM | POA: Insufficient documentation

## 2018-10-09 DIAGNOSIS — I2510 Atherosclerotic heart disease of native coronary artery without angina pectoris: Secondary | ICD-10-CM | POA: Insufficient documentation

## 2018-10-09 DIAGNOSIS — G4730 Sleep apnea, unspecified: Secondary | ICD-10-CM | POA: Diagnosis not present

## 2018-10-09 DIAGNOSIS — Z7901 Long term (current) use of anticoagulants: Secondary | ICD-10-CM | POA: Insufficient documentation

## 2018-10-09 DIAGNOSIS — E1142 Type 2 diabetes mellitus with diabetic polyneuropathy: Secondary | ICD-10-CM | POA: Insufficient documentation

## 2018-10-09 DIAGNOSIS — K219 Gastro-esophageal reflux disease without esophagitis: Secondary | ICD-10-CM | POA: Diagnosis not present

## 2018-10-09 DIAGNOSIS — Z85820 Personal history of malignant melanoma of skin: Secondary | ICD-10-CM | POA: Insufficient documentation

## 2018-10-09 DIAGNOSIS — E785 Hyperlipidemia, unspecified: Secondary | ICD-10-CM | POA: Diagnosis not present

## 2018-10-09 DIAGNOSIS — E7800 Pure hypercholesterolemia, unspecified: Secondary | ICD-10-CM | POA: Insufficient documentation

## 2018-10-09 DIAGNOSIS — Z96651 Presence of right artificial knee joint: Secondary | ICD-10-CM | POA: Diagnosis not present

## 2018-10-09 DIAGNOSIS — L97524 Non-pressure chronic ulcer of other part of left foot with necrosis of bone: Secondary | ICD-10-CM | POA: Diagnosis not present

## 2018-10-09 DIAGNOSIS — Z86718 Personal history of other venous thrombosis and embolism: Secondary | ICD-10-CM | POA: Insufficient documentation

## 2018-10-09 DIAGNOSIS — Z79899 Other long term (current) drug therapy: Secondary | ICD-10-CM | POA: Insufficient documentation

## 2018-10-09 DIAGNOSIS — H3500 Unspecified background retinopathy: Secondary | ICD-10-CM | POA: Diagnosis not present

## 2018-10-09 DIAGNOSIS — E1151 Type 2 diabetes mellitus with diabetic peripheral angiopathy without gangrene: Secondary | ICD-10-CM | POA: Diagnosis not present

## 2018-10-09 DIAGNOSIS — I252 Old myocardial infarction: Secondary | ICD-10-CM | POA: Insufficient documentation

## 2018-10-09 DIAGNOSIS — Z9641 Presence of insulin pump (external) (internal): Secondary | ICD-10-CM | POA: Insufficient documentation

## 2018-10-09 HISTORY — PX: AMPUTATION TOE: SHX6595

## 2018-10-09 LAB — GLUCOSE, CAPILLARY
Glucose-Capillary: 108 mg/dL — ABNORMAL HIGH (ref 70–99)
Glucose-Capillary: 114 mg/dL — ABNORMAL HIGH (ref 70–99)

## 2018-10-09 SURGERY — AMPUTATION, TOE
Anesthesia: General | Site: Foot | Laterality: Left

## 2018-10-09 MED ORDER — ONDANSETRON HCL 4 MG/2ML IJ SOLN: 4.0000 mg | Freq: Once | INTRAMUSCULAR | Status: DC | PRN

## 2018-10-09 MED ORDER — LIDOCAINE HCL (CARDIAC) PF 100 MG/5ML IV SOSY
PREFILLED_SYRINGE | INTRAVENOUS | Status: DC | PRN
  Administered 2018-10-09: 40 mg via INTRAVENOUS

## 2018-10-09 MED ORDER — OXYCODONE HCL 5 MG PO TABS: 5.0000 mg | ORAL_TABLET | Freq: Once | ORAL | Status: DC | PRN

## 2018-10-09 MED ORDER — POVIDONE-IODINE 7.5 % EX SOLN: Freq: Once | CUTANEOUS | Status: DC

## 2018-10-09 MED ORDER — MIDAZOLAM HCL 2 MG/2ML IJ SOLN
INTRAMUSCULAR | Status: DC | PRN
  Administered 2018-10-09: 2 mg via INTRAVENOUS

## 2018-10-09 MED ORDER — FENTANYL CITRATE (PF) 100 MCG/2ML IJ SOLN: 25.0000 ug | INTRAMUSCULAR | Status: DC | PRN

## 2018-10-09 MED ORDER — ACETAMINOPHEN 10 MG/ML IV SOLN: 1000.0000 mg | Freq: Once | INTRAVENOUS | Status: DC | PRN

## 2018-10-09 MED ORDER — LACTATED RINGERS IV SOLN
INTRAVENOUS | Status: DC
  Administered 2018-10-09: 08:00:00 via INTRAVENOUS

## 2018-10-09 MED ORDER — FENTANYL CITRATE (PF) 100 MCG/2ML IJ SOLN
INTRAMUSCULAR | Status: DC | PRN
  Administered 2018-10-09 (×2): 50 ug via INTRAVENOUS

## 2018-10-09 MED ORDER — OXYCODONE HCL 5 MG/5ML PO SOLN: 5.0000 mg | Freq: Once | ORAL | Status: DC | PRN

## 2018-10-09 MED ORDER — CEFAZOLIN SODIUM-DEXTROSE 2-4 GM/100ML-% IV SOLN
2.0000 g | INTRAVENOUS | Status: AC
  Administered 2018-10-09: 2 g via INTRAVENOUS

## 2018-10-09 MED ORDER — LACTATED RINGERS IV SOLN: INTRAVENOUS | Status: DC

## 2018-10-09 MED ORDER — BUPIVACAINE HCL (PF) 0.25 % IJ SOLN
INTRAMUSCULAR | Status: DC | PRN
  Administered 2018-10-09: 2 mL

## 2018-10-09 MED ORDER — PROPOFOL 500 MG/50ML IV EMUL
INTRAVENOUS | Status: DC | PRN
  Administered 2018-10-09: 100 ug/kg/min via INTRAVENOUS

## 2018-10-09 MED ORDER — HYDROCODONE-ACETAMINOPHEN 5-325 MG PO TABS: 1.0000 | ORAL_TABLET | Freq: Four times a day (QID) | ORAL | 0 refills | Status: DC | PRN

## 2018-10-09 SURGICAL SUPPLY — 30 items
BANDAGE ELASTIC 4 LF NS (GAUZE/BANDAGES/DRESSINGS) ×2 IMPLANT
BENZOIN TINCTURE PRP APPL 2/3 (GAUZE/BANDAGES/DRESSINGS) ×1 IMPLANT
BLADE OSC/SAGITTAL MD 5.5X18 (BLADE) ×1 IMPLANT
BLADE OSC/SAGITTAL MD 9X18.5 (BLADE) IMPLANT
BNDG ESMARK 6X12 TAN STRL LF (GAUZE/BANDAGES/DRESSINGS) ×2 IMPLANT
BNDG GAUZE 4.5X4.1 6PLY STRL (MISCELLANEOUS) ×2 IMPLANT
BNDG STRETCH 4X75 STRL LF (GAUZE/BANDAGES/DRESSINGS) ×2 IMPLANT
CANISTER SUCT 1200ML W/VALVE (MISCELLANEOUS) ×2 IMPLANT
COVER LIGHT HANDLE UNIVERSAL (MISCELLANEOUS) ×4 IMPLANT
DURAPREP 26ML APPLICATOR (WOUND CARE) ×2 IMPLANT
ELECT REM PT RETURN 9FT ADLT (ELECTROSURGICAL) ×2
ELECTRODE REM PT RTRN 9FT ADLT (ELECTROSURGICAL) ×1 IMPLANT
GAUZE PETRO XEROFOAM 1X8 (MISCELLANEOUS) ×2 IMPLANT
GAUZE SPONGE 4X4 12PLY STRL (GAUZE/BANDAGES/DRESSINGS) ×2 IMPLANT
GLOVE BIO SURGEON STRL SZ8 (GLOVE) ×4 IMPLANT
GOWN STRL REUS W/ TWL LRG LVL3 (GOWN DISPOSABLE) ×1 IMPLANT
GOWN STRL REUS W/ TWL XL LVL3 (GOWN DISPOSABLE) ×1 IMPLANT
GOWN STRL REUS W/TWL LRG LVL3 (GOWN DISPOSABLE) ×1
GOWN STRL REUS W/TWL XL LVL3 (GOWN DISPOSABLE) ×1
KIT TURNOVER KIT A (KITS) ×2 IMPLANT
NDL HYPO 18GX1.5 BLUNT FILL (NEEDLE) IMPLANT
NDL HYPO 25GX1X1/2 BEV (NEEDLE) IMPLANT
NEEDLE HYPO 18GX1.5 BLUNT FILL (NEEDLE) ×2 IMPLANT
NEEDLE HYPO 25GX1X1/2 BEV (NEEDLE) ×2 IMPLANT
NS IRRIG 500ML POUR BTL (IV SOLUTION) ×2 IMPLANT
PACK EXTREMITY ARMC (MISCELLANEOUS) ×2 IMPLANT
STOCKINETTE STRL 6IN 960660 (GAUZE/BANDAGES/DRESSINGS) ×2 IMPLANT
SUT ETHILON 3-0 FS-10 30 BLK (SUTURE) ×2
SUTURE EHLN 3-0 FS-10 30 BLK (SUTURE) IMPLANT
SYR 10ML LL (SYRINGE) ×1 IMPLANT

## 2018-10-09 NOTE — Anesthesia Procedure Notes (Signed)
Procedure Name: General with mask airway Date/Time: 10/09/2018 7:50 AM Performed by: Jeannene Patella, CRNA Pre-anesthesia Checklist: Patient identified, Emergency Drugs available, Suction available, Patient being monitored and Timeout performed Patient Re-evaluated:Patient Re-evaluated prior to induction Oxygen Delivery Method: Simple face mask Induction Type: IV induction

## 2018-10-09 NOTE — Op Note (Signed)
Operative note   Surgeon: Dr. Albertine Patricia, DPM.    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  Amputation distal part of the second toe IP joint level left           EBL: Less than 5 cc    Anesthesia:IV sedation about anesthesia team with local anesthetic including 2 cc of 0.25% Marcaine plain injected at the base of the toe preoperatively    Hemostasis: None    Specimen: Excised distal ulceration from distal tip of left second toe.  Also sent portion of the middle phalanx that I resected    Complications: The lab for culture and sensitivity    Operative indications: Chronic diabetic ulceration with exposed bone    Procedure:  Patient was brought into the OR and placed on the operating table in thesupine position. After anesthesia was obtained theleft lower extremity was prepped and draped in usual sterile fashion.  Operative Report: This time attention directed to the second toe of the left foot.  The distal phalanx had been removed and he had a distal amputation number of years ago.  The ulceration was present at the residual tip of the toe and 2 semielliptical incisions were made transversely across this area.  At this point the area was dissected away from the residual bone which was the middle phalanx that was still present.  This was the bone that was exposed in the wound at the time.  The bone was inspected and appeared to be fairly stable did not see any overt evidence of infection or breakdown of the bone.  I removed the distal 1/2-2/3 of the proximal phalanx good solid bone was noted at the excision area.  This was sent for culture.  The previously excised ulceration was sent for pathologic evaluation.  After copious irrigation the skin was closed with 3-0 nylon simple interrupted sutures.  I did correct a dogear at the distal tip of the toe by doing a V excision of the plantar  flap centrally.  This was closed also with 3-0 nylon simple erupted suture.  A sterile dressings and placed across wound consisting of Xeroform gauze 4 x 4's Kling and Kerlix.    Patient tolerated the procedure and anesthesia well.  Was transported from the OR to the PACU with all vital signs stable and vascular status intact. To be discharged per routine protocol.  Will follow up in approximately 1 week in the outpatient clinic.

## 2018-10-09 NOTE — Anesthesia Postprocedure Evaluation (Signed)
Anesthesia Post Note  Patient: Carl Yang  Procedure(s) Performed: TOE IPJ 2ND TOE LEFT (Left Foot)  Patient location during evaluation: PACU Anesthesia Type: General Level of consciousness: awake and alert, oriented and patient cooperative Pain management: pain level controlled Vital Signs Assessment: post-procedure vital signs reviewed and stable Respiratory status: spontaneous breathing, nonlabored ventilation and respiratory function stable Cardiovascular status: blood pressure returned to baseline and stable Postop Assessment: adequate PO intake Anesthetic complications: no    Darrin Nipper

## 2018-10-09 NOTE — Transfer of Care (Signed)
Immediate Anesthesia Transfer of Care Note  Patient: Carl Yang  Procedure(s) Performed: TOE IPJ 2ND TOE LEFT (Left Foot)  Patient Location: PACU  Anesthesia Type: General  Level of Consciousness: awake, alert  and patient cooperative  Airway and Oxygen Therapy: Patient Spontanous Breathing and Patient connected to supplemental oxygen  Post-op Assessment: Post-op Vital signs reviewed, Patient's Cardiovascular Status Stable, Respiratory Function Stable, Patent Airway and No signs of Nausea or vomiting  Post-op Vital Signs: Reviewed and stable  Complications: No apparent anesthesia complications

## 2018-10-09 NOTE — H&P (Signed)
H and P has been reviewed and no changes are noted.  

## 2018-10-10 ENCOUNTER — Encounter: Payer: Self-pay | Admitting: Podiatry

## 2018-10-13 LAB — SURGICAL PATHOLOGY

## 2018-10-14 LAB — AEROBIC/ANAEROBIC CULTURE W GRAM STAIN (SURGICAL/DEEP WOUND)

## 2019-01-15 ENCOUNTER — Inpatient Hospital Stay: Admission: RE | Admit: 2019-01-15 | Payer: Medicare Other | Source: Ambulatory Visit

## 2019-01-29 ENCOUNTER — Inpatient Hospital Stay: Admit: 2019-01-29 | Payer: Medicare Other | Admitting: Surgery

## 2019-01-29 SURGERY — ARTHROPLASTY, KNEE, TOTAL
Anesthesia: Choice | Laterality: Left

## 2019-04-03 ENCOUNTER — Other Ambulatory Visit
Admission: RE | Admit: 2019-04-03 | Discharge: 2019-04-03 | Disposition: A | Discharge status: Home / Self Care | Payer: Medicare Other | Source: Ambulatory Visit | Attending: Surgery | Admitting: Surgery

## 2019-04-03 DIAGNOSIS — Z96651 Presence of right artificial knee joint: Secondary | ICD-10-CM | POA: Diagnosis present

## 2019-04-03 LAB — SYNOVIAL CELL COUNT + DIFF, W/ CRYSTALS
Crystals, Fluid: NONE SEEN
Eosinophils-Synovial: 0 %
Lymphocytes-Synovial Fld: 0 %
Monocyte-Macrophage-Synovial Fluid: 1 %
Neutrophil, Synovial: 99 %
WBC, Synovial: 43806 /mm3 — ABNORMAL HIGH (ref 0–200)

## 2019-04-20 DIAGNOSIS — E10319 Type 1 diabetes mellitus with unspecified diabetic retinopathy without macular edema: Secondary | ICD-10-CM | POA: Insufficient documentation

## 2019-04-28 ENCOUNTER — Encounter
Admission: RE | Admit: 2019-04-28 | Discharge: 2019-04-28 | Disposition: A | Discharge status: Home / Self Care | Payer: Medicare Other | Source: Ambulatory Visit | Attending: Surgery | Admitting: Surgery

## 2019-04-28 ENCOUNTER — Other Ambulatory Visit: Payer: Self-pay

## 2019-04-28 DIAGNOSIS — Z01812 Encounter for preprocedural laboratory examination: Secondary | ICD-10-CM | POA: Insufficient documentation

## 2019-04-28 DIAGNOSIS — Z20828 Contact with and (suspected) exposure to other viral communicable diseases: Secondary | ICD-10-CM | POA: Insufficient documentation

## 2019-04-28 LAB — BASIC METABOLIC PANEL
Anion gap: 7 (ref 5–15)
BUN: 28 mg/dL — ABNORMAL HIGH (ref 8–23)
CO2: 24 mmol/L (ref 22–32)
Calcium: 8.5 mg/dL — ABNORMAL LOW (ref 8.9–10.3)
Chloride: 108 mmol/L (ref 98–111)
Creatinine, Ser: 1.22 mg/dL (ref 0.61–1.24)
GFR calc Af Amer: >60 mL/min (ref >60)
GFR calc non Af Amer: 57 mL/min — ABNORMAL LOW (ref >60)
Glucose, Bld: 115 mg/dL — ABNORMAL HIGH (ref 70–99)
Potassium: 4.2 mmol/L (ref 3.5–5.1)
Sodium: 139 mmol/L (ref 135–145)

## 2019-04-28 LAB — URINALYSIS, ROUTINE W REFLEX MICROSCOPIC
Bilirubin Urine: NEGATIVE
Glucose, UA: NEGATIVE mg/dL
Hgb urine dipstick: NEGATIVE
Ketones, ur: NEGATIVE mg/dL
Leukocytes,Ua: NEGATIVE
Nitrite: NEGATIVE
Protein, ur: NEGATIVE mg/dL
Specific Gravity, Urine: 1.017 (ref 1.005–1.030)
pH: 5 (ref 5.0–8.0)

## 2019-04-28 LAB — CBC
HCT: 38 % — ABNORMAL LOW (ref 39.0–52.0)
Hemoglobin: 12 g/dL — ABNORMAL LOW (ref 13.0–17.0)
MCH: 28.6 pg (ref 26.0–34.0)
MCHC: 31.6 g/dL (ref 30.0–36.0)
MCV: 90.5 fL (ref 80.0–100.0)
Platelets: 212 10*3/uL (ref 150–400)
RBC: 4.2 MIL/uL — ABNORMAL LOW (ref 4.22–5.81)
RDW: 15 % (ref 11.5–15.5)
WBC: 7.9 10*3/uL (ref 4.0–10.5)
nRBC: 0 % (ref 0.0–0.2)

## 2019-04-28 LAB — TYPE AND SCREEN
ABO/RH(D): A NEG
Antibody Screen: NEGATIVE

## 2019-04-28 LAB — SURGICAL PCR SCREEN
MRSA, PCR: NEGATIVE
Staphylococcus aureus: NEGATIVE

## 2019-04-28 NOTE — Patient Instructions (Addendum)
Your procedure is scheduled on:  Tues 8/18 Report to Day Surgery. To find out your arrival time please call [phone] between 1PM - 3PM on Mon. 8/17.  Remember: Instructions that are not followed completely may result in serious medical risk,  up to and including death, or upon the discretion of your surgeon and anesthesiologist your  surgery may need to be rescheduled.     _X__ 1. Do not eat food after midnight the night before your procedure.                 No gum chewing or hard candies. You may drink clear liquids up to 2 hours                 before you are scheduled to arrive for your surgery- DO not drink clear                 liquids within 2 hours of the start of your surgery.                 Clear Liquids include:  water,   __X__2.  On the morning of surgery brush your teeth with toothpaste and water, you                may rinse your mouth with mouthwash if you wish.  Do not swallow any toothpaste of mouthwash.     _X__ 3.  No Alcohol for 24 hours before or after surgery.   ___ 4.  Do Not Smoke or use e-cigarettes For 24 Hours Prior to Your Surgery.                 Do not use any chewable tobacco products for at least 6 hours prior to                 surgery.  ____  5.  Bring all medications with you on the day of surgery if instructed.   __x__  6.  Notify your doctor if there is any change in your medical condition      (cold, fever, infections).     Do not wear jewelry, make-up, hairpins, clips or nail polish. Do not wear lotions, powders, or perfumes. You may wear deodorant. Do not shave 48 hours prior to surgery. Men may shave face and neck. Do not bring valuables to the hospital.    [facility] is not responsible for any belongings or valuables.  Contacts, dentures or bridgework may not be worn into surgery. Leave your suitcase in the car. After surgery it may be brought to your room. For patients admitted to the hospital, discharge  time is determined by your treatment team.   Patients discharged the day of surgery will not be allowed to drive home.   Please read over the following fact sheets that you were given:    ___x_ Take these medicines the morning of surgery with A SIP OF WATER:    1. esomeprazole (NEXIUM) 40 MG capsule  Dose the night before and one the morning of surgery  2. acetaminophen (TYLENOL) 325 MG tablet  If needed  3.   4.  5.  6.  ____ Fleet Enema (as directed)   __x__ Use CHG Soap as directed  ____ Use inhalers on the day of surgery  __x__ Stop metformin 2 days prior to surgery Last dose 8/15   _x___ Take 1/2 of usual insulin dose the night before surgery. No insulin the morning  of surgery.     Set insulin pump to basal only and don't bolus after evening meal  x____ Stop Coumadin on as per MD's recommendation  ____ Stop Anti-inflammatories    ____ Stop supplements until after surgery.    _x___ Bring C-Pap to the hospital.

## 2019-04-29 LAB — URINE CULTURE
Culture: NO GROWTH
Special Requests: NORMAL

## 2019-05-01 ENCOUNTER — Other Ambulatory Visit: Payer: Self-pay

## 2019-05-01 ENCOUNTER — Other Ambulatory Visit
Admission: RE | Admit: 2019-05-01 | Discharge: 2019-05-01 | Disposition: A | Discharge status: Home / Self Care | Payer: Medicare Other | Source: Ambulatory Visit | Attending: Surgery | Admitting: Surgery

## 2019-05-01 DIAGNOSIS — Z01812 Encounter for preprocedural laboratory examination: Secondary | ICD-10-CM | POA: Diagnosis not present

## 2019-05-01 LAB — SARS CORONAVIRUS 2 (TAT 6-24 HRS): SARS Coronavirus 2: NEGATIVE

## 2019-05-04 MED ORDER — VANCOMYCIN HCL 1000 MG IV SOLR
1000.0000 mg | Freq: Once | INTRAVENOUS | Status: DC
  Filled 2019-05-04: qty 1000

## 2019-05-04 MED ORDER — VANCOMYCIN HCL IN DEXTROSE 1-5 GM/200ML-% IV SOLN
1000.0000 mg | INTRAVENOUS | Status: AC
  Administered 2019-05-05: 10:00:00 1000 mg via INTRAVENOUS

## 2019-05-05 ENCOUNTER — Inpatient Hospital Stay: Payer: Medicare Other | Admitting: Certified Registered"

## 2019-05-05 ENCOUNTER — Encounter
Admission: RE | Disposition: A | Discharge status: Skilled Nursing Facility | Payer: Self-pay | Source: Home / Self Care | Attending: Surgery

## 2019-05-05 ENCOUNTER — Other Ambulatory Visit: Payer: Self-pay

## 2019-05-05 ENCOUNTER — Inpatient Hospital Stay
Admission: RE | Admit: 2019-05-05 | Discharge: 2019-05-08 | DRG: 468 | Disposition: A | Discharge status: Skilled Nursing Facility | Payer: Medicare Other | Attending: Surgery | Admitting: Surgery

## 2019-05-05 ENCOUNTER — Inpatient Hospital Stay: Payer: Medicare Other

## 2019-05-05 ENCOUNTER — Encounter: Payer: Self-pay | Admitting: *Deleted

## 2019-05-05 DIAGNOSIS — Z20828 Contact with and (suspected) exposure to other viral communicable diseases: Secondary | ICD-10-CM | POA: Diagnosis present

## 2019-05-05 DIAGNOSIS — Z794 Long term (current) use of insulin: Secondary | ICD-10-CM | POA: Diagnosis not present

## 2019-05-05 DIAGNOSIS — E785 Hyperlipidemia, unspecified: Secondary | ICD-10-CM | POA: Diagnosis present

## 2019-05-05 DIAGNOSIS — I10 Essential (primary) hypertension: Secondary | ICD-10-CM | POA: Diagnosis present

## 2019-05-05 DIAGNOSIS — N400 Enlarged prostate without lower urinary tract symptoms: Secondary | ICD-10-CM | POA: Diagnosis present

## 2019-05-05 DIAGNOSIS — E11649 Type 2 diabetes mellitus with hypoglycemia without coma: Secondary | ICD-10-CM | POA: Diagnosis present

## 2019-05-05 DIAGNOSIS — Z86718 Personal history of other venous thrombosis and embolism: Secondary | ICD-10-CM | POA: Diagnosis not present

## 2019-05-05 DIAGNOSIS — Y831 Surgical operation with implant of artificial internal device as the cause of abnormal reaction of the patient, or of later complication, without mention of misadventure at the time of the procedure: Secondary | ICD-10-CM | POA: Diagnosis present

## 2019-05-05 DIAGNOSIS — E119 Type 2 diabetes mellitus without complications: Secondary | ICD-10-CM | POA: Diagnosis not present

## 2019-05-05 DIAGNOSIS — K219 Gastro-esophageal reflux disease without esophagitis: Secondary | ICD-10-CM | POA: Diagnosis present

## 2019-05-05 DIAGNOSIS — Z9641 Presence of insulin pump (external) (internal): Secondary | ICD-10-CM | POA: Diagnosis present

## 2019-05-05 DIAGNOSIS — T8453XA Infection and inflammatory reaction due to internal right knee prosthesis, initial encounter: Principal | ICD-10-CM | POA: Diagnosis present

## 2019-05-05 DIAGNOSIS — I252 Old myocardial infarction: Secondary | ICD-10-CM | POA: Diagnosis not present

## 2019-05-05 DIAGNOSIS — Z8631 Personal history of diabetic foot ulcer: Secondary | ICD-10-CM | POA: Diagnosis not present

## 2019-05-05 DIAGNOSIS — I4891 Unspecified atrial fibrillation: Secondary | ICD-10-CM | POA: Diagnosis present

## 2019-05-05 DIAGNOSIS — E1142 Type 2 diabetes mellitus with diabetic polyneuropathy: Secondary | ICD-10-CM | POA: Diagnosis present

## 2019-05-05 DIAGNOSIS — Z7901 Long term (current) use of anticoagulants: Secondary | ICD-10-CM | POA: Diagnosis not present

## 2019-05-05 DIAGNOSIS — Z96651 Presence of right artificial knee joint: Secondary | ICD-10-CM

## 2019-05-05 DIAGNOSIS — Z79899 Other long term (current) drug therapy: Secondary | ICD-10-CM | POA: Diagnosis not present

## 2019-05-05 DIAGNOSIS — I2510 Atherosclerotic heart disease of native coronary artery without angina pectoris: Secondary | ICD-10-CM | POA: Diagnosis present

## 2019-05-05 DIAGNOSIS — G4733 Obstructive sleep apnea (adult) (pediatric): Secondary | ICD-10-CM | POA: Diagnosis present

## 2019-05-05 DIAGNOSIS — I493 Ventricular premature depolarization: Secondary | ICD-10-CM | POA: Diagnosis not present

## 2019-05-05 DIAGNOSIS — E1151 Type 2 diabetes mellitus with diabetic peripheral angiopathy without gangrene: Secondary | ICD-10-CM | POA: Diagnosis present

## 2019-05-05 DIAGNOSIS — Z89412 Acquired absence of left great toe: Secondary | ICD-10-CM | POA: Diagnosis not present

## 2019-05-05 DIAGNOSIS — M009 Pyogenic arthritis, unspecified: Secondary | ICD-10-CM | POA: Diagnosis not present

## 2019-05-05 DIAGNOSIS — Z85820 Personal history of malignant melanoma of skin: Secondary | ICD-10-CM

## 2019-05-05 DIAGNOSIS — Z89422 Acquired absence of other left toe(s): Secondary | ICD-10-CM | POA: Diagnosis not present

## 2019-05-05 DIAGNOSIS — R0600 Dyspnea, unspecified: Secondary | ICD-10-CM

## 2019-05-05 DIAGNOSIS — Z955 Presence of coronary angioplasty implant and graft: Secondary | ICD-10-CM

## 2019-05-05 HISTORY — PX: TOTAL KNEE REVISION: SHX996

## 2019-05-05 LAB — GLUCOSE, CAPILLARY
Glucose-Capillary: 103 mg/dL — ABNORMAL HIGH (ref 70–99)
Glucose-Capillary: 90 mg/dL (ref 70–99)
Glucose-Capillary: 159 mg/dL — ABNORMAL HIGH (ref 70–99)
Glucose-Capillary: 256 mg/dL — ABNORMAL HIGH (ref 70–99)

## 2019-05-05 LAB — CBC
HCT: 40.7 % (ref 39.0–52.0)
Hemoglobin: 13 g/dL (ref 13.0–17.0)
MCH: 29 pg (ref 26.0–34.0)
MCHC: 31.9 g/dL (ref 30.0–36.0)
MCV: 90.8 fL (ref 80.0–100.0)
Platelets: 216 10*3/uL (ref 150–400)
RBC: 4.48 MIL/uL (ref 4.22–5.81)
RDW: 14.9 % (ref 11.5–15.5)
WBC: 10.6 10*3/uL — ABNORMAL HIGH (ref 4.0–10.5)
nRBC: 0 % (ref 0.0–0.2)

## 2019-05-05 LAB — BASIC METABOLIC PANEL
Anion gap: 6 (ref 5–15)
BUN: 18 mg/dL (ref 8–23)
CO2: 21 mmol/L — ABNORMAL LOW (ref 22–32)
Calcium: 8.4 mg/dL — ABNORMAL LOW (ref 8.9–10.3)
Chloride: 109 mmol/L (ref 98–111)
Creatinine, Ser: 1 mg/dL (ref 0.61–1.24)
GFR calc Af Amer: >60 mL/min (ref >60)
GFR calc non Af Amer: >60 mL/min (ref >60)
Glucose, Bld: 189 mg/dL — ABNORMAL HIGH (ref 70–99)
Potassium: 4.3 mmol/L (ref 3.5–5.1)
Sodium: 136 mmol/L (ref 135–145)

## 2019-05-05 LAB — TROPONIN I (HIGH SENSITIVITY)
Troponin I (High Sensitivity): 14 ng/L (ref <18)
Troponin I (High Sensitivity): 14 ng/L (ref <18)

## 2019-05-05 LAB — MAGNESIUM: Magnesium: 1.7 mg/dL (ref 1.7–2.4)

## 2019-05-05 LAB — PROTIME-INR
INR: 1.2 (ref 0.8–1.2)
Prothrombin Time: 15.5 seconds — ABNORMAL HIGH (ref 11.4–15.2)

## 2019-05-05 SURGERY — TOTAL KNEE REVISION
Anesthesia: Spinal | Site: Knee | Laterality: Right

## 2019-05-05 MED ORDER — TETRACAINE HCL 1 % IJ SOLN
INTRAMUSCULAR | Status: DC | PRN
  Administered 2019-05-05: 10 mg via INTRASPINAL

## 2019-05-05 MED ORDER — PROPOFOL 10 MG/ML IV BOLUS
INTRAVENOUS | Status: AC
  Filled 2019-05-05: qty 20

## 2019-05-05 MED ORDER — METOCLOPRAMIDE HCL 10 MG PO TABS: 5.0000 mg | ORAL_TABLET | Freq: Three times a day (TID) | ORAL | Status: DC | PRN

## 2019-05-05 MED ORDER — BISACODYL 10 MG RE SUPP: 10.0000 mg | Freq: Every day | RECTAL | Status: DC | PRN

## 2019-05-05 MED ORDER — PROPOFOL 500 MG/50ML IV EMUL
INTRAVENOUS | Status: AC
  Filled 2019-05-05: qty 50

## 2019-05-05 MED ORDER — ACETAMINOPHEN 10 MG/ML IV SOLN
INTRAVENOUS | Status: AC
  Filled 2019-05-05: qty 100

## 2019-05-05 MED ORDER — SODIUM CHLORIDE 0.9 % IV SOLN
INTRAVENOUS | Status: DC | PRN
  Administered 2019-05-05: 60 mL

## 2019-05-05 MED ORDER — ACETAMINOPHEN 500 MG PO TABS
1000.0000 mg | ORAL_TABLET | Freq: Four times a day (QID) | ORAL | Status: AC
  Administered 2019-05-05 – 2019-05-06 (×3): 1000 mg via ORAL
  Filled 2019-05-05 (×4): qty 2

## 2019-05-05 MED ORDER — DEXTROSE-NACL 5-0.2 % IV SOLN
INTRAVENOUS | Status: DC | PRN
  Administered 2019-05-05: 13:00:00 via INTRAVENOUS

## 2019-05-05 MED ORDER — VANCOMYCIN HCL 1.25 G IV SOLR
1250.0000 mg | Freq: Two times a day (BID) | INTRAVENOUS | Status: DC
  Administered 2019-05-06 – 2019-05-07 (×3): 1250 mg via INTRAVENOUS
  Filled 2019-05-05 (×6): qty 1250

## 2019-05-05 MED ORDER — PANTOPRAZOLE SODIUM 40 MG PO TBEC
40.0000 mg | DELAYED_RELEASE_TABLET | Freq: Every day | ORAL | Status: DC
  Administered 2019-05-06 – 2019-05-08 (×3): 40 mg via ORAL
  Filled 2019-05-05 (×3): qty 1

## 2019-05-05 MED ORDER — SODIUM CHLORIDE 0.9 % IV SOLN
INTRAVENOUS | Status: DC
  Administered 2019-05-05 – 2019-05-06 (×2): via INTRAVENOUS

## 2019-05-05 MED ORDER — BUPIVACAINE LIPOSOME 1.3 % IJ SUSP
INTRAMUSCULAR | Status: AC
  Filled 2019-05-05: qty 20

## 2019-05-05 MED ORDER — BUPIVACAINE HCL (PF) 0.5 % IJ SOLN
INTRAMUSCULAR | Status: AC
  Filled 2019-05-05: qty 10

## 2019-05-05 MED ORDER — ONDANSETRON HCL 4 MG/2ML IJ SOLN
INTRAMUSCULAR | Status: AC
  Filled 2019-05-05: qty 2

## 2019-05-05 MED ORDER — BUPIVACAINE HCL (PF) 0.5 % IJ SOLN: INTRAMUSCULAR | Status: DC | PRN

## 2019-05-05 MED ORDER — EPHEDRINE SULFATE 50 MG/ML IJ SOLN
INTRAMUSCULAR | Status: DC | PRN
  Administered 2019-05-05: 5 mg via INTRAVENOUS
  Administered 2019-05-05: 10 mg via INTRAVENOUS

## 2019-05-05 MED ORDER — DEXAMETHASONE SODIUM PHOSPHATE 10 MG/ML IJ SOLN
INTRAMUSCULAR | Status: DC | PRN
  Administered 2019-05-05: 10 mg via INTRAVENOUS

## 2019-05-05 MED ORDER — METFORMIN HCL ER 500 MG PO TB24
500.0000 mg | ORAL_TABLET | Freq: Two times a day (BID) | ORAL | Status: DC
  Administered 2019-05-05 – 2019-05-08 (×4): 500 mg via ORAL
  Filled 2019-05-05 (×7): qty 1

## 2019-05-05 MED ORDER — KETOROLAC TROMETHAMINE 15 MG/ML IJ SOLN
15.0000 mg | Freq: Once | INTRAMUSCULAR | Status: AC
  Administered 2019-05-05: 16:00:00 15 mg via INTRAVENOUS

## 2019-05-05 MED ORDER — ALBUTEROL SULFATE (2.5 MG/3ML) 0.083% IN NEBU: 2.5000 mg | INHALATION_SOLUTION | Freq: Four times a day (QID) | RESPIRATORY_TRACT | Status: DC | PRN

## 2019-05-05 MED ORDER — OXYCODONE HCL 5 MG PO TABS
5.0000 mg | ORAL_TABLET | ORAL | Status: DC | PRN
  Administered 2019-05-06 – 2019-05-08 (×2): 5 mg via ORAL
  Filled 2019-05-05 (×2): qty 1

## 2019-05-05 MED ORDER — KETOROLAC TROMETHAMINE 15 MG/ML IJ SOLN
INTRAMUSCULAR | Status: AC
  Administered 2019-05-05: 15 mg via INTRAVENOUS
  Filled 2019-05-05: qty 1

## 2019-05-05 MED ORDER — ONDANSETRON HCL 4 MG/2ML IJ SOLN: 4.0000 mg | Freq: Four times a day (QID) | INTRAMUSCULAR | Status: DC | PRN

## 2019-05-05 MED ORDER — HYDRALAZINE HCL 20 MG/ML IJ SOLN
INTRAMUSCULAR | Status: DC | PRN
  Administered 2019-05-05 (×2): 5 mg via INTRAVENOUS
  Administered 2019-05-05 (×2): 2.5 mg via INTRAVENOUS

## 2019-05-05 MED ORDER — MIDAZOLAM HCL 2 MG/2ML IJ SOLN
INTRAMUSCULAR | Status: AC
  Filled 2019-05-05: qty 2

## 2019-05-05 MED ORDER — PROPOFOL 500 MG/50ML IV EMUL
INTRAVENOUS | Status: DC | PRN
  Administered 2019-05-05: 80 ug/kg/min via INTRAVENOUS

## 2019-05-05 MED ORDER — SODIUM CHLORIDE 0.9 % IV SOLN
INTRAVENOUS | Status: DC
  Administered 2019-05-05 (×2): via INTRAVENOUS

## 2019-05-05 MED ORDER — TRANEXAMIC ACID 1000 MG/10ML IV SOLN
INTRAVENOUS | Status: DC | PRN
  Administered 2019-05-05: 1000 mg via TOPICAL

## 2019-05-05 MED ORDER — INSULIN LISPRO 100 UNIT/ML ~~LOC~~ SOLN: 150.0000 [IU] | SUBCUTANEOUS | Status: DC

## 2019-05-05 MED ORDER — FENTANYL CITRATE (PF) 100 MCG/2ML IJ SOLN
INTRAMUSCULAR | Status: AC
  Filled 2019-05-05: qty 2

## 2019-05-05 MED ORDER — WARFARIN - PHARMACIST DOSING INPATIENT: Freq: Every day | Status: DC

## 2019-05-05 MED ORDER — WARFARIN SODIUM 5 MG PO TABS
5.0000 mg | ORAL_TABLET | Freq: Once | ORAL | Status: AC
  Administered 2019-05-05: 21:00:00 5 mg via ORAL
  Filled 2019-05-05: qty 1

## 2019-05-05 MED ORDER — SODIUM CHLORIDE (PF) 0.9 % IJ SOLN
INTRAMUSCULAR | Status: AC
  Filled 2019-05-05: qty 10

## 2019-05-05 MED ORDER — DEXAMETHASONE SODIUM PHOSPHATE 10 MG/ML IJ SOLN
INTRAMUSCULAR | Status: AC
  Filled 2019-05-05: qty 1

## 2019-05-05 MED ORDER — VANCOMYCIN HCL 1000 MG IV SOLR
INTRAVENOUS | Status: AC
  Filled 2019-05-05: qty 1000

## 2019-05-05 MED ORDER — DIPHENHYDRAMINE HCL 12.5 MG/5ML PO ELIX: 12.5000 mg | ORAL_SOLUTION | ORAL | Status: DC | PRN

## 2019-05-05 MED ORDER — ENOXAPARIN SODIUM 40 MG/0.4ML ~~LOC~~ SOLN
40.0000 mg | SUBCUTANEOUS | Status: DC
  Administered 2019-05-06 – 2019-05-08 (×3): 40 mg via SUBCUTANEOUS
  Filled 2019-05-05 (×3): qty 0.4

## 2019-05-05 MED ORDER — ONDANSETRON HCL 4 MG/2ML IJ SOLN: 4.0000 mg | Freq: Once | INTRAMUSCULAR | Status: DC | PRN

## 2019-05-05 MED ORDER — BUPIVACAINE-EPINEPHRINE (PF) 0.25% -1:200000 IJ SOLN
INTRAMUSCULAR | Status: AC
  Filled 2019-05-05: qty 30

## 2019-05-05 MED ORDER — DOCUSATE SODIUM 100 MG PO CAPS
100.0000 mg | ORAL_CAPSULE | Freq: Two times a day (BID) | ORAL | Status: DC
  Administered 2019-05-05 – 2019-05-08 (×6): 100 mg via ORAL
  Filled 2019-05-05 (×6): qty 1

## 2019-05-05 MED ORDER — RAMIPRIL 2.5 MG PO CAPS
2.5000 mg | ORAL_CAPSULE | Freq: Every evening | ORAL | Status: DC
  Administered 2019-05-05 – 2019-05-08 (×4): 2.5 mg via ORAL
  Filled 2019-05-05 (×4): qty 1

## 2019-05-05 MED ORDER — VANCOMYCIN HCL IN DEXTROSE 1-5 GM/200ML-% IV SOLN
1000.0000 mg | Freq: Once | INTRAVENOUS | Status: AC
  Administered 2019-05-05: 1000 mg via INTRAVENOUS
  Filled 2019-05-05: qty 200

## 2019-05-05 MED ORDER — KETOROLAC TROMETHAMINE 15 MG/ML IJ SOLN
7.5000 mg | Freq: Four times a day (QID) | INTRAMUSCULAR | Status: AC
  Administered 2019-05-05 – 2019-05-06 (×4): 7.5 mg via INTRAVENOUS
  Filled 2019-05-05 (×4): qty 1

## 2019-05-05 MED ORDER — TETRACAINE HCL 1 % IJ SOLN
INTRAMUSCULAR | Status: AC
  Filled 2019-05-05: qty 2

## 2019-05-05 MED ORDER — ONDANSETRON HCL 4 MG PO TABS: 4.0000 mg | ORAL_TABLET | Freq: Four times a day (QID) | ORAL | Status: DC | PRN

## 2019-05-05 MED ORDER — FENTANYL CITRATE (PF) 100 MCG/2ML IJ SOLN
INTRAMUSCULAR | Status: DC | PRN
  Administered 2019-05-05 (×2): 25 ug via INTRAVENOUS
  Administered 2019-05-05: 50 ug via INTRAVENOUS

## 2019-05-05 MED ORDER — LORATADINE 10 MG PO TABS
10.0000 mg | ORAL_TABLET | Freq: Every day | ORAL | Status: DC
  Administered 2019-05-06 – 2019-05-08 (×3): 10 mg via ORAL
  Filled 2019-05-05 (×3): qty 1

## 2019-05-05 MED ORDER — FENTANYL CITRATE (PF) 100 MCG/2ML IJ SOLN: 25.0000 ug | INTRAMUSCULAR | Status: DC | PRN

## 2019-05-05 MED ORDER — INSULIN ASPART 100 UNIT/ML ~~LOC~~ SOLN: 0.0000 [IU] | Freq: Three times a day (TID) | SUBCUTANEOUS | Status: DC

## 2019-05-05 MED ORDER — ONDANSETRON HCL 4 MG/2ML IJ SOLN
INTRAMUSCULAR | Status: DC | PRN
  Administered 2019-05-05: 4 mg via INTRAVENOUS

## 2019-05-05 MED ORDER — ACETAMINOPHEN 325 MG PO TABS: 325.0000 mg | ORAL_TABLET | Freq: Four times a day (QID) | ORAL | Status: DC | PRN

## 2019-05-05 MED ORDER — MAGNESIUM HYDROXIDE 400 MG/5ML PO SUSP
30.0000 mL | Freq: Every day | ORAL | Status: DC | PRN
  Administered 2019-05-07: 30 mL via ORAL
  Filled 2019-05-05: qty 30

## 2019-05-05 MED ORDER — ADULT MULTIVITAMIN W/MINERALS CH
ORAL_TABLET | Freq: Every day | ORAL | Status: DC
  Administered 2019-05-06 – 2019-05-08 (×3): 1 via ORAL
  Filled 2019-05-05 (×3): qty 1

## 2019-05-05 MED ORDER — ACETAMINOPHEN 10 MG/ML IV SOLN
INTRAVENOUS | Status: DC | PRN
  Administered 2019-05-05: 1000 mg via INTRAVENOUS

## 2019-05-05 MED ORDER — EPHEDRINE SULFATE 50 MG/ML IJ SOLN
INTRAMUSCULAR | Status: AC
  Filled 2019-05-05: qty 1

## 2019-05-05 MED ORDER — SODIUM CHLORIDE FLUSH 0.9 % IV SOLN
INTRAVENOUS | Status: AC
  Filled 2019-05-05: qty 40

## 2019-05-05 MED ORDER — NEOMYCIN-POLYMYXIN B GU 40-200000 IR SOLN
Status: AC
  Filled 2019-05-05: qty 20

## 2019-05-05 MED ORDER — HYDROMORPHONE HCL 1 MG/ML IJ SOLN: 0.5000 mg | INTRAMUSCULAR | Status: DC | PRN

## 2019-05-05 MED ORDER — VANCOMYCIN HCL 1000 MG IV SOLR
INTRAVENOUS | Status: AC
  Filled 2019-05-05: qty 2000

## 2019-05-05 MED ORDER — MIDAZOLAM HCL 5 MG/5ML IJ SOLN
INTRAMUSCULAR | Status: DC | PRN
  Administered 2019-05-05 (×3): 1 mg via INTRAVENOUS

## 2019-05-05 MED ORDER — BUPIVACAINE-EPINEPHRINE (PF) 0.25% -1:200000 IJ SOLN
INTRAMUSCULAR | Status: DC | PRN
  Administered 2019-05-05: 30 mL

## 2019-05-05 MED ORDER — BUPIVACAINE HCL (PF) 0.5 % IJ SOLN
INTRAMUSCULAR | Status: DC | PRN
  Administered 2019-05-05: 2 mL

## 2019-05-05 MED ORDER — VANCOMYCIN HCL IN DEXTROSE 1-5 GM/200ML-% IV SOLN: 1000.0000 mg | Freq: Two times a day (BID) | INTRAVENOUS | Status: DC

## 2019-05-05 MED ORDER — VANCOMYCIN HCL IN DEXTROSE 1-5 GM/200ML-% IV SOLN
INTRAVENOUS | Status: AC
  Administered 2019-05-05: 10:00:00 1000 mg via INTRAVENOUS
  Filled 2019-05-05: qty 200

## 2019-05-05 MED ORDER — ATORVASTATIN CALCIUM 20 MG PO TABS
80.0000 mg | ORAL_TABLET | Freq: Every day | ORAL | Status: DC
  Administered 2019-05-05 – 2019-05-08 (×4): 80 mg via ORAL
  Filled 2019-05-05 (×4): qty 4

## 2019-05-05 MED ORDER — METOCLOPRAMIDE HCL 5 MG/ML IJ SOLN: 5.0000 mg | Freq: Three times a day (TID) | INTRAMUSCULAR | Status: DC | PRN

## 2019-05-05 MED ORDER — TRANEXAMIC ACID 1000 MG/10ML IV SOLN
INTRAVENOUS | Status: AC
  Filled 2019-05-05: qty 10

## 2019-05-05 MED ORDER — TRAMADOL HCL 50 MG PO TABS
50.0000 mg | ORAL_TABLET | Freq: Four times a day (QID) | ORAL | Status: DC
  Administered 2019-05-05 – 2019-05-08 (×12): 50 mg via ORAL
  Filled 2019-05-05 (×12): qty 1

## 2019-05-05 MED ORDER — FLEET ENEMA 7-19 GM/118ML RE ENEM: 1.0000 | ENEMA | Freq: Once | RECTAL | Status: DC | PRN

## 2019-05-05 SURGICAL SUPPLY — 67 items
AUG FEM KNEE LG CRUCIATE WING (Orthopedic Implant) ×2 IMPLANT
AUGMENT FEM KNEE LG CRUCI WING (Orthopedic Implant) ×1 IMPLANT
AUTOTRANSFUS HAS 1/8 (MISCELLANEOUS) ×2
BEARING TIB PS 20 79/83 (Knees) ×2 IMPLANT
BLADE SAGITTAL AGGR TOOTH XLG (BLADE) ×2 IMPLANT
BLADE SAGITTAL WIDE XTHICK NO (BLADE) ×2 IMPLANT
BLADE SAW SAG 29X58X.64 (BLADE) ×2 IMPLANT
BNDG COHESIVE 6X5 TAN STRL LF (GAUZE/BANDAGES/DRESSINGS) ×2 IMPLANT
BNDG ELASTIC 4X5.8 VLCR STR LF (GAUZE/BANDAGES/DRESSINGS) ×2 IMPLANT
BOWL CEMENT MIXING ADV NOZZLE (MISCELLANEOUS) ×2 IMPLANT
CANISTER SUCT 1200ML W/VALVE (MISCELLANEOUS) ×2 IMPLANT
CANISTER SUCT 3000ML PPV (MISCELLANEOUS) ×4 IMPLANT
CEMENT BONE REFOBACIN R1X40 US (Cement) ×8 IMPLANT
CHLORAPREP W/TINT 26 (MISCELLANEOUS) ×4 IMPLANT
COOLER POLAR GLACIER W/PUMP (MISCELLANEOUS) ×2 IMPLANT
COVER WAND RF STERILE (DRAPES) ×2 IMPLANT
DRAPE 3/4 80X56 (DRAPES) ×4 IMPLANT
DRAPE INCISE IOBAN 66X45 STRL (DRAPES) ×4 IMPLANT
DRAPE SPLIT 6X30 W/TAPE (DRAPES) ×4 IMPLANT
DRAPE SURG 17X11 SM STRL (DRAPES) ×4 IMPLANT
ELECT CAUTERY BLADE 6.4 (BLADE) ×4 IMPLANT
ELECT REM PT RETURN 9FT ADLT (ELECTROSURGICAL) ×2
ELECTRODE REM PT RTRN 9FT ADLT (ELECTROSURGICAL) ×1 IMPLANT
GAUZE SPONGE 4X4 12PLY STRL (GAUZE/BANDAGES/DRESSINGS) ×2 IMPLANT
GAUZE XEROFORM 1X8 LF (GAUZE/BANDAGES/DRESSINGS) ×4 IMPLANT
GLOVE BIO SURGEON STRL SZ8 (GLOVE) ×8 IMPLANT
GLOVE BIOGEL M 7.0 STRL (GLOVE) ×8 IMPLANT
GLOVE BIOGEL PI IND STRL 7.5 (GLOVE) ×2 IMPLANT
GLOVE BIOGEL PI INDICATOR 7.5 (GLOVE) ×2
GLOVE INDICATOR 8.0 STRL GRN (GLOVE) ×4 IMPLANT
GOWN STRL REUS W/ TWL LRG LVL3 (GOWN DISPOSABLE) ×4 IMPLANT
GOWN STRL REUS W/ TWL XL LVL3 (GOWN DISPOSABLE) ×2 IMPLANT
GOWN STRL REUS W/TWL LRG LVL3 (GOWN DISPOSABLE) ×4
GOWN STRL REUS W/TWL XL LVL3 (GOWN DISPOSABLE) ×2
HEMOVAC 400CC 10FR (MISCELLANEOUS) ×2 IMPLANT
HOOD PEEL AWAY FLYTE STAYCOOL (MISCELLANEOUS) ×6 IMPLANT
IMMBOLIZER KNEE 19 BLUE UNIV (SOFTGOODS) ×2 IMPLANT
KIT STIMULAN RAPID CURE 5CC (Orthopedic Implant) ×2 IMPLANT
KIT TURNOVER KIT A (KITS) ×2 IMPLANT
NDL SAFETY ECLIPSE 18X1.5 (NEEDLE) ×2 IMPLANT
NEEDLE HYPO 18GX1.5 SHARP (NEEDLE) ×2
NEEDLE SPNL 20GX3.5 QUINCKE YW (NEEDLE) ×2 IMPLANT
NS IRRIG 1000ML POUR BTL (IV SOLUTION) ×2 IMPLANT
PACK TOTAL KNEE (MISCELLANEOUS) ×2 IMPLANT
PAD WRAPON POLAR KNEE (MISCELLANEOUS) ×1 IMPLANT
PARTIAL TIBIAL SZC RT SYS KNEE (Orthopedic Implant) ×2 IMPLANT
PEG PATELLA SERIES A 37MMX10MM (Orthopedic Implant) ×2 IMPLANT
PULSAVAC PLUS IRRIG FAN TIP (DISPOSABLE) ×4
SOL .9 NS 3000ML IRR  AL (IV SOLUTION) ×1
SOL .9 NS 3000ML IRR UROMATIC (IV SOLUTION) ×1 IMPLANT
SPONGE LAP 18X18 RF (DISPOSABLE) ×2 IMPLANT
STAPLER SKIN PROX 35W (STAPLE) ×2 IMPLANT
STEM FEM VG 22X120 (Stem) ×2 IMPLANT
STEM FEMORAL VANGUARD 20X120MM (Stem) ×2 IMPLANT
SUCTION FRAZIER HANDLE 10FR (MISCELLANEOUS) ×2
SUCTION TUBE FRAZIER 10FR DISP (MISCELLANEOUS) ×2 IMPLANT
SUT VIC AB 0 CT1 36 (SUTURE) ×8 IMPLANT
SUT VIC AB 2-0 CT2 27 (SUTURE) ×8 IMPLANT
SYR 20ML LL LF (SYRINGE) ×2 IMPLANT
SYR 30ML LL (SYRINGE) ×2 IMPLANT
SYR 50ML LL SCALE MARK (SYRINGE) ×4 IMPLANT
SYSTEM AUTOTRANSFUS DUAL TROCR (MISCELLANEOUS) ×1 IMPLANT
TIBIAL PARTIAL SZC RT SYS KNEE (Orthopedic Implant) ×1 IMPLANT
TIP FAN IRRIG PULSAVAC PLUS (DISPOSABLE) ×2 IMPLANT
TRAY FOLEY SLVR 16FR LF STAT (SET/KITS/TRAYS/PACK) ×2 IMPLANT
TRAY TIBIAL VANGUARD 79MM REV (Orthopedic Implant) ×2 IMPLANT
WRAPON POLAR PAD KNEE (MISCELLANEOUS) ×2

## 2019-05-05 NOTE — Anesthesia Procedure Notes (Signed)
Date/Time: 05/05/2019 10:55 AM Performed by: Lavone Orn, CRNA Oxygen Delivery Method: Simple face mask

## 2019-05-05 NOTE — Anesthesia Procedure Notes (Signed)
Spinal  Patient location during procedure: OR Staffing Performed: anesthesiologist  Preanesthetic Checklist Completed: patient identified, site marked, surgical consent, pre-op evaluation, timeout performed, IV checked, risks and benefits discussed and monitors and equipment checked Spinal Block Patient position: sitting Prep: DuraPrep Patient monitoring: heart rate, cardiac monitor, continuous pulse ox and blood pressure Approach: midline Location: L3-4 Injection technique: single-shot Needle Needle type: Sprotte  Needle gauge: 22 G Needle length: 10 cm Assessment Sensory level: T4 Additional Notes Uneventful sab.  One pass with good flow csf.  JA

## 2019-05-05 NOTE — Transfer of Care (Signed)
Immediate Anesthesia Transfer of Care Note  Patient: Carl Yang  Procedure(s) Performed: TOTAL KNEE REVISION (Right Knee)  Patient Location: PACU  Anesthesia Type:Spinal  Level of Consciousness: drowsy  Airway & Oxygen Therapy: Patient Spontanous Breathing and Patient connected to face mask oxygen  Post-op Assessment: Report given to RN and Post -op Vital signs reviewed and stable  Post vital signs: stable  Last Vitals:  Vitals Value Taken Time  BP 152/84 05/05/19 1559  Temp 36.2 C 05/05/19 1559  Pulse 65 05/05/19 1606  Resp 26 05/05/19 1606  SpO2 97 % 05/05/19 1606  Vitals shown include unvalidated device data.  Last Pain:  Vitals:   05/05/19 1559  TempSrc:   PainSc: Asleep         Complications: No apparent anesthesia complications

## 2019-05-05 NOTE — H&P (Signed)
Paper H&P to be scanned into permanent record. H&P reviewed and patient re-examined. No changes. 

## 2019-05-05 NOTE — TOC Progression Note (Signed)
Transition of Care [facility]) - Progression Note    Patient Details  Name: Carl Yang MRN: [id number] Date of Birth: 1940/12/09  Transition of Care [facility]) CM/SW [place], RN Phone Number: 05/05/2019, 4:08 PM  Clinical Narrative:     Requested lovenox price will notify patient once obtained       Expected Discharge Plan and Services                                                 Social Determinants of Health (SDOH) Interventions    Readmission Risk Interventions No flowsheet data found.

## 2019-05-05 NOTE — Op Note (Signed)
05/05/2019  4:40 PM  Patient:   Carl Yang  Pre-Op Diagnosis:   Septic right total knee arthroplasty.  Post-Op Diagnosis:   Same.  Procedure:   One-stage revision right TKA using all-cemented Biomet 360 revision knee system with a 75 mm PS femur with a 120 mm stem, a 79 mm tibial tray with a 120 mm stem, a 20 mm PS insert, and a 37 x 10 mm all-poly 3-pegged domed patella.  Surgeon:   Pascal Lux, MD  Assistant:   Cameron Proud, PA-C; Freddie Apley, PA-S  Anesthesia:   Spinal  Findings:   As above  Complications:   None  EBL:   150 cc  Fluids:   1050 cc crystalloid  UOP:   1150 cc  TT:   86 minutes, then 124 minutes at 300 mmHg  Drains:   None  Closure:   Staples  Implants:   As above  Brief Clinical Note:   The patient is a 78 year old male who is now 9 months status post a right total knee arthroplasty. He began to develop pain in his knee 3-4 months after surgery. His clinical picture was unremarkable for infection as he had no significant swelling or erythema around the knee, no excessive warmth, and no fevers. A sed rate and C-reactive protein levels were drawn several months ago which were within normal limits, so aspiration was not conducted at that time. However, because of continued symptoms, further work-up to evaluate for infection was conducted. Recent laboratory work demonstrated a normal sedimentation rate and C-reactive protein levels, and a recent aspiration showed no growth. However, the aspiration also showed 44,000 white cells, concerning for infection. Therefore, patient presents at this time for a one-stage revision right total knee arthroplasty for a presumed septic right total knee with a low virulent organism.  Procedure:   The patient was brought into the operating room. After adequate spinal anesthesia was obtained, the patient was lain in the supine position. A Foley catheter was placed by the nurse before the right lower extremity was prepped  with ChloraPrep solution and draped sterilely. Preoperative antibiotics were administered. After verifying the proper laterality with a surgical timeout, the limb was exsanguinated with an Esmarch and the tourniquet inflated to 300 mmHg. A standard anterior approach to the knee was made utilizing the previous anterior incision. The incision was carried down through the subcutaneous tissues to expose superficial retinaculum. This was split the length of the incision and the medial flap elevated sufficiently to expose the medial retinaculum. The medial retinaculum was incised, leaving a 3-4 mm cuff of tissue on the patella. This was extended distally along the medial border of the patellar tendon and proximally through the medial third of the quadriceps tendon. Abundant scar tissues were excised and several samples sent down to the lab for was examination under the microscope for signs of acute inflammation. The soft tissues were elevated off the anteromedial and anterolateral aspects of the proximal tibia to the level of the collateral ligaments. Several cultures also were obtained.  The femoral and tibial components were removed using flexible and small osteotomes to minimize any loss of bone. Care was taken to remove all of the retained bone cement. In addition, the patella was removed with an oscillating saw. Again the retained cement was removed using curettes. The knee was then copiously irrigated with sterile saline via the jet lavage system.  At this point, the skin was closed with staples over a sponge. A second sponge  was applied over the wound and the knee was wrapped with an Ace wrap. The tourniquet was let down and all of the instruments on the back table were removed from the room. A fresh set of instruments was opened and all scrubbed personnel re-scrubbed and re-gowned. The leg was redraped sterilely before the leg was again exsanguinated with an Esmarch and the tourniquet inflated to 300 mmHg the  wound was reopened and the knee exposed. Again, the knee was copiously irrigated with sterile saline solution via the jet lavage system. Once the bony surfaces were cleaned, the revision portion of the procedure was begun.    The tibia was approached first. The 9 mm intramedullary reamer was inserted and advanced to 120 mm. The tibial canal was reamed sequentially up to a 20 mm reamer which demonstrated good cortical chatter. This was left in place and the intramedullary tibial cutting guide positioned. A  freshening cut was performed, removing approximately 5 to 6 mm of tibia off anteriorly and several millimeters posteriorly. This resulted in an excellent circumferential cortical rim. The tibial trial components were positioned. It was determined that the 79 mm tibial tray provided the best coverage. No offset was required. The tray was pinned into position and the central reamer and keel were used sequentially to complete preparation of the tibia.  Attention then was directed to the distal femur. The intramedullary canal was accessed with a 9 mm reamer inserted to a depth of 120 mm. The canal was reamed sequentially to 22 mm. This provided good cortical chatter at a depth of 120 mm. The reamer was left in place and the distal cutting block positioned. A 1 mm freshening cut was performed. The 75 mm trial component was inserted. This demonstrated good anterior posterior coverage, as well as a good fit on both the medial and lateral femoral condyles. Again, no offset was required. A trial reduction was performed using both the femoral and tibial components and first the 10 mm, then the 14 mm, and then the 18 mm posterior stabilized inserts. The 18 mm insert demonstrated satisfactory stability to varus and valgus stressing in both flexion and extension while permitting full extension.   The patella was assessed and found to be optimally covered by the 37 mm component. The 3 peg holes were drilled into place  before the trial patella button was positioned. Patellar tracking was assessed and found to be excellent. The trial components were then removed.  The bony surfaces were prepared for cementing by irrigating them thoroughly with bacitracin saline solution. In addition, 20 cc of Exparel diluted out to 60 cc with normal saline and 30 cc of 0.5% Sensorcaine were injected into the postero-medial and postero-lateral aspects of the knee, the medial and lateral gutter regions, and the peri-incisional tissues to help with postoperative analgesia. Meanwhile, the permanent femoral and tibial components were put together on the back table utilizing all of the appropriate pieces. Once the components were put together properly, the cement was mixed on the back table including 1 g of vancomycin per package of cement. When the cement was ready, the tibial tray was cemented in first. The excess cement was removed using [employer] fast streamer. Next, the femoral component was impacted into place. Again, the excess cement was removed using [employer] fast streamer. The 18 mm PS trial insert was positioned and the knee brought into extension while the cement hardened. Finally, the patella was cemented into place and secured using the patellar clamp. Again, the excess cement was  removed using [employer].   Once the cement had hardened, the knee was placed through a range of motion. It was felt that the knee was still slightly loose, so an additional trial was performed using the 20 mm insert. The 20 mm insert demonstrated excellent stability to varus and valgus stressing both in flexion and extension yet permitted full extension. Therefore, the trial insert was removed and, after verifying that no cement had been retained posteriorly, the permanent 20 mm posterior stabilized insert was positioned and secured using the appropriate key locking mechanism. Again the knee was placed through a range of motion with the findings as described  above.  The wound was copiously irrigated with bacitracin saline solution using the jet lavage system before the quadriceps tendon, including the site of the quadriceps snip, and retinacular layer were reapproximated using #0 Vicryl interrupted sutures. A total of 10 cc of transexemic acid (TXA) was injected intra-articularly before the subcutaneous tissues were closed in several layers using 2-0 Vicryl interrupted sutures. The skin was closed using staples. A sterile honeycomb dressing was applied to the skin before the leg was wrapped with an Ace wrap to accommodate the Polar care device The patient was then awakened and returned to the recovery room in satisfactory condition after tolerating the procedure well.

## 2019-05-05 NOTE — Anesthesia Preprocedure Evaluation (Signed)
Anesthesia Evaluation  Patient identified by MRN, date of birth, ID band Patient awake    Reviewed: Allergy & Precautions, H&P , NPO status , Patient's Chart, lab work & pertinent test results, reviewed documented beta blocker date and time   History of Anesthesia Complications (+) history of anesthetic complications  Airway Mallampati: II   Neck ROM: full    Dental  (+) Poor Dentition   Pulmonary neg pulmonary ROS, shortness of breath and with exertion, sleep apnea and Continuous Positive Airway Pressure Ventilation ,    Pulmonary exam normal        Cardiovascular Exercise Tolerance: Poor hypertension, On Medications + CAD, + Past MI and + Cardiac Stents  negative cardio ROS Normal cardiovascular exam Rhythm:regular Rate:Normal     Neuro/Psych  Neuromuscular disease negative neurological ROS  negative psych ROS   GI/Hepatic negative GI ROS, Neg liver ROS, GERD  Medicated,  Endo/Other  negative endocrine ROSdiabetes  Renal/GU negative Renal ROS  negative genitourinary   Musculoskeletal   Abdominal   Peds  Hematology negative hematology ROS (+)   Anesthesia Other Findings Past Medical History: No date: Arthritis No date: Cancer (HCC)     Comment:  Melanoma No date: Complication of anesthesia     Comment:  After surgery at [facility], the patient coded during the               night. No date: Coronary artery disease     Comment:  Stents No date: Diabetes mellitus without complication (HCC) No date: DVT (deep venous thrombosis) (HCC)     Comment:  Right lower limb No date: Dyspnea No date: GERD (gastroesophageal reflux disease) No date: Myocardial infarction [facility])     Comment:  1998, 2003 No date: Neuromuscular disorder ([place])     Comment:  Peripheral Neuropathy r/t DM No date: Peripheral vascular disease ([place])     Comment:  Diabetes No date: Sleep apnea     Comment:  Uses CPAP Past Surgical History: No  date: AMPUTATION TOE; Left     Comment:  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 10/09/2018: AMPUTATION TOE; Left     Comment:  Procedure: TOE IPJ 2ND TOE LEFT;  Surgeon: Albertine Patricia, DPM;  Location: [place];  Service:               Podiatry;  Laterality: Left;  IVA LOCAL Diabeticv -               insulin pump No date: BACK SURGERY     Comment:  L4 & L5 Fusion 2003: CARDIAC CATHETERIZATION No date: DIAGNOSTIC LAPAROSCOPY No date: EYE SURGERY; Bilateral     Comment:  cataracts No date: TONSILLECTOMY 07/15/2018: TOTAL KNEE ARTHROPLASTY; Right     Comment:  Procedure: TOTAL KNEE ARTHROPLASTY;  Surgeon: Corky Mull, MD;  Location: [facility];  Service: Orthopedics;                Laterality: Right; BMI    Body Mass Index: 27.83 kg/m     Reproductive/Obstetrics negative OB ROS                             Anesthesia Physical Anesthesia Plan  ASA: III  Anesthesia Plan: Spinal   Post-op Pain Management:  Induction:   PONV Risk Score and Plan:   Airway Management Planned:   Additional Equipment:   Intra-op Plan:   Post-operative Plan:   Informed Consent: I have reviewed the patients History and Physical, chart, labs and discussed the procedure including the risks, benefits and alternatives for the proposed anesthesia with the patient or authorized representative who has indicated his/her understanding and acceptance.     Dental Advisory Given  Plan Discussed with: CRNA  Anesthesia Plan Comments:         Anesthesia Quick Evaluation

## 2019-05-05 NOTE — Progress Notes (Deleted)
Called  Dr Clydell Hakim office to notify them of a positive for bacteria urine culture (resulted 05/01/2019). A message was left for his nurse via the receptionist.

## 2019-05-05 NOTE — Progress Notes (Signed)
Per Dr. Kayleen Memos, patient to have off unit telemetry monitoring. 1A aware. Spoke with Tyra, OR RN, who states Dr. Roland Rack will put in Hospitalist Consult order.  Patient transferred to inpatient room with tele monitoring.

## 2019-05-05 NOTE — Consult Note (Signed)
Pharmacy Antibiotic Note  Carl Yang is a 78 y.o. male admitted on 05/05/2019 with wound infection.  Pharmacy has been consulted for Vancomycin dosing.  Patient received a dose of Vancomycin today at ~0930.   Ordered Vancomycin 1000 mg x1 dose for a loading dose of 2g.   Plan: Will start Vancomycin 1250 mg Q12H @ 0600 on 8/19. AUC goal 400-550 Expected AUC 515.1 Scr used 1  Height: 6\' 3"  (190.5 cm) Weight: 222 lb 10.6 oz (101 kg) IBW/kg (Calculated) : 84.5  Temp (24hrs), Avg:97.9 F (36.6 C), Min:97.1 F (36.2 C), Max:98.6 F (37 C)  Recent Labs  Lab 05/05/19 1852  WBC 10.6*    Estimated Creatinine Clearance: 61.6 mL/min (by C-G formula based on SCr of 1.22 mg/dL).    No Known Allergies  Antimicrobials this admission: 8/18 Vancomcyin   Dose adjustments this admission: N/A  Microbiology results: 8/18 Wound Cx: pending   Thank you for allowing pharmacy to be a part of this patient's care.  Rowland Lathe 05/05/2019 7:17 PM

## 2019-05-05 NOTE — Consult Note (Addendum)
[place] for Warfarin Indication: atrial fibrillation  No Known Allergies  Patient Measurements: Height: 6\' 3"  (190.5 cm) Weight: 222 lb 10.6 oz (101 kg) IBW/kg (Calculated) : 84.5 Heparin Dosing Weight:   Vital Signs: Temp: 98.5 F (36.9 C) (08/18 1812) Temp Source: Oral (08/18 1812) BP: 155/71 (08/18 1812) Pulse Rate: 82 (08/18 1812)  Labs: Recent Labs    05/05/19 0901  LABPROT 15.5*  INR 1.2    Estimated Creatinine Clearance: 61.6 mL/min (by C-G formula based on SCr of 1.22 mg/dL).   Medications:  Warfarin 3 mg ever Mon, Tues, Wed, Thurs, Fri - last dose 8/13 in preparation for knee surgery  Warfarin 4 mg Sat and Sun- last dose 8/9 in preparation for knee surgery  Enoxaparin 40 mg Q24H- scheduled to start tomorrow morning.   Assessment: Patient had TKA today. Discussed with Dr. Roland Rack about starting warfarin tonight and he confirmed to resume warfarin for this evening dose. Will follow up on baseline CBC.   8/18 INR 1.2  Goal of Therapy:  INR 2-3 Monitor platelets by anticoagulation protocol: Yes   Plan:  Will start warfarin 5 mg tonight x1.  Will follow in the morning on LMWH dosing.      ADDENDUM @ 8:24pm: Per Dr. Roland Rack, patient will remain on enoxaparin 40 mg Q24H and not receive treatment dose of enoxaparin.   Rowland Lathe 05/05/2019,7:00 PM

## 2019-05-05 NOTE — Consult Note (Addendum)
[facility] - [facility]   PATIENT NAME: Carl Yang    MR#:  [id number]  DATE OF BIRTH:  1941-01-24  DATE OF CONSULT:  05/05/2019  PRIMARY CARE PHYSICIAN: Marisue Ivan, MD   REQUESTING/REFERRING PHYSICIAN: Leron Croak, MD  CHIEF COMPLAINT:  No chief complaint on file.   HISTORY OF PRESENT ILLNESS:  Carl Yang  is a 78 y.o. male with a known history of coronary artery disease with history of stent placement followed by Dr. Lady Gary, diabetes mellitus, history of DVT of right lower extremity, GERD.  Hospitalist service consulted for medical management of patient with arrhythmia status post revision of right total knee replacement.  He is on telemetry monitoring currently.  He denies chest pain or palpitations.  He denies shortness of breath.  He is sitting up in bed with no complaints.  He denies cough.  No fevers or chills.  No abdominal pain, nausea, vomiting.  EKG completed shows normal sinus rhythm.  We are awaiting results of BMP, magnesium, troponin, CBC.  Will review and treat accordingly when available.  Chest x-ray pending.  PAST MEDICAL HISTORY:   Past Medical History:  Diagnosis Date  . Arthritis   . Cancer (HCC)    Melanoma  . Complication of anesthesia    After surgery at [facility], the patient coded during the night.  . Coronary artery disease    Stents  . Diabetes mellitus without complication (HCC)   . DVT (deep venous thrombosis) (HCC)    Right lower limb  . Dyspnea   . GERD (gastroesophageal reflux disease)   . Myocardial infarction (HCC)    1998, 2003  . Neuromuscular disorder (HCC)    Peripheral Neuropathy r/t DM  . Peripheral vascular disease (HCC)    Diabetes  . Sleep apnea    Uses CPAP    PAST SURGICAL HISTOIRY:   Past Surgical History:  Procedure Laterality Date  . 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  Marland Kitchen AMPUTATION TOE Left 10/09/2018   Procedure: TOE IPJ 2ND TOE LEFT;  Surgeon: Recardo Evangelist, DPM;  Location: [facility];  Service: Podiatry;  Laterality: Left;  IVA LOCAL Diabeticv - insulin pump  . BACK SURGERY     L4 & L5 Fusion  . CARDIAC CATHETERIZATION  2003  . DIAGNOSTIC LAPAROSCOPY    . EYE SURGERY Bilateral    cataracts  . TONSILLECTOMY    . TOTAL KNEE ARTHROPLASTY Right 07/15/2018   Procedure: TOTAL KNEE ARTHROPLASTY;  Surgeon: Christena Flake, MD;  Location: [facility];  Service: Orthopedics;  Laterality: Right;    SOCIAL HISTORY:   Social History   Tobacco Use  . Smoking status: Never Smoker  . Smokeless tobacco: Never Used  Substance Use Topics  . Alcohol use: Yes    Alcohol/week: 1.0 standard drinks    Types: 1 Glasses of wine per week    Comment: Socially    FAMILY HISTORY:  History reviewed. No pertinent family history.  DRUG ALLERGIES:  No Known Allergies  REVIEW OF SYSTEMS:   Review of Systems  Constitutional: Negative for chills, fever and malaise/fatigue.  HENT: Negative for congestion, sinus pain and sore throat.   Eyes: Negative for blurred vision and double vision.  Respiratory: Negative for cough, sputum production, shortness of breath and wheezing.   Cardiovascular: Negative for chest pain, palpitations and orthopnea.  Gastrointestinal: Negative for abdominal pain, heartburn, nausea and vomiting.  Genitourinary: Negative for dysuria, flank  pain and hematuria.  Musculoskeletal: Negative for myalgias.  Neurological: Negative for dizziness, weakness and headaches.  Psychiatric/Behavioral: Negative for depression.     MEDICATIONS AT HOME:   Prior to Admission medications   Medication Sig Start Date End Date Taking? Authorizing Provider  atorvastatin (LIPITOR) 80 MG tablet Take 1 tablet (80 mg total) by mouth daily. 07/30/18  Yes Sharee Holster, NP  docusate sodium (COLACE) 100 MG capsule Take 100 mg by mouth daily.   Yes [provider]  esomeprazole (NEXIUM) 40 MG capsule Take 1 capsule (40 mg total) by  mouth daily. 07/30/18  Yes Sharee Holster, NP  fexofenadine (ALLEGRA) 180 MG tablet Take 180 mg by mouth daily. 05/09/18  Yes [provider]  HUMALOG 100 UNIT/ML injection Inject 1.5 mLs (150 Units total) into the skin continuous. Via pump 07/30/18  Yes Sharee Holster, NP  metFORMIN (GLUCOPHAGE-XR) 500 MG 24 hr tablet Take 1 tablet (500 mg total) by mouth 2 (two) times daily. 07/30/18  Yes Sharee Holster, NP  Multiple Vitamins-Minerals (CENTRUM SILVER 50+MEN PO) Take 1 tablet by mouth daily.   Yes [provider]  ramipril (ALTACE) 2.5 MG capsule Take 1 capsule (2.5 mg total) by mouth daily. Patient taking differently: Take 2.5 mg by mouth every evening.  07/30/18  Yes Sharee Holster, NP  UNABLE TO FIND Inhale 1 continuous puffing into the lungs at bedtime.    Yes [provider]  warfarin (COUMADIN) 3 MG tablet Take 1 tablet (3 mg total) by mouth daily. Patient takes 3mg  Wed,Sat, Sun Patient taking differently: Take 3 mg by mouth every Monday, Tuesday, Wednesday, Thursday, and Friday.  07/30/18  Yes Sharee Holster, NP  warfarin (COUMADIN) 4 MG tablet Take 1 tablet (4 mg total) by mouth daily. Patient takes 4mg  Mon, Tues, Thurs, Fri Patient taking differently: Take 4 mg by mouth See admin instructions. Take 4 mg by mouth on Saturday and Sunday 07/30/18  Yes Sharee Holster, NP  acetaminophen (TYLENOL) 325 MG tablet Take 650 mg by mouth every 4 (four) hours as needed. 07/18/18   [provider]  HYDROcodone-acetaminophen (NORCO) 5-325 MG tablet Take 1 tablet by mouth every 6 (six) hours as needed for moderate pain. Patient not taking: Reported on 04/28/2019 10/09/18   Recardo Evangelist, DPM      VITAL SIGNS:  Blood pressure (!) 155/71, pulse 82, temperature 98.5 F (36.9 C), temperature source Oral, resp. rate 18, height 6\' 3"  (1.905 m), weight 101 kg, SpO2 98 %.  PHYSICAL EXAMINATION:  GENERAL:  78 y.o.-year-old patient lying in the bed with no acute  distress.  EYES: Pupils equal, round, reactive to light and accommodation. No scleral icterus. Extraocular muscles intact.  HEENT: Head atraumatic, normocephalic. Oropharynx and nasopharynx clear.  NECK:  Supple, no jugular venous distention. No thyroid enlargement, no tenderness.  LUNGS: Normal breath sounds bilaterally, no wheezing, rales, rhonchi . No use of accessory muscles of respiration.  CARDIOVASCULAR: S1, S2, RRR. No murmurs, rubs, gallops, clicks.  ABDOMEN: Soft, nontender, nondistended. Bowel sounds present. No organomegaly or mass.  EXTREMITIES: No pedal edema, cyanosis, or clubbing. Right knee immobilized   NEUROLOGIC: Cranial nerves II through XII are intact. No focal motor or sensory deficits appreciated bilaterally  PSYCHIATRIC: The patient is alert and oriented x 3. Good affect SKIN: No obvious rash, lesion, or ulcer.  LABORATORY PANEL:   CBC No results for input(s): WBC, HGB, HCT, PLT in the last 168 hours. ------------------------------------------------------------------------------------------------------------------  Chemistries  No results for input(s): NA, K, CL, CO2, GLUCOSE, BUN, CREATININE, CALCIUM, MG, AST, ALT, ALKPHOS, BILITOT in the last 168 hours.  Invalid input(s): GFRCGP ------------------------------------------------------------------------------------------------------------------  Cardiac Enzymes No results for input(s): TROPONINI in the last 168 hours. ------------------------------------------------------------------------------------------------------------------  RADIOLOGY:  Dg Knee Right Port  Result Date: 05/05/2019 CLINICAL DATA:  Status post total right knee arthroplasty revision. EXAM: PORTABLE RIGHT KNEE - 1-2 VIEW COMPARISON:  July 15, 2018 FINDINGS: Post total right knee arthroplasty with normal alignment of the prosthetic components. No evidence of fracture. Expected soft tissue edema and emphysema. Skin staples noted.  IMPRESSION: Post total right knee arthroplasty without evidence of immediate complications. Electronically Signed   By: Ted Mcalpine M.D.   On: 05/05/2019 16:26     IMPRESSION AND PLAN:   1.  Arrhythmia - Arrhythmia status post revision right total knee [facility] service consulted for medical management and further evaluation. -Telemetry monitoring - EKG with normal sinus rhythm -Pending labs include CBC, BMP, magnesium, and troponin -Will review and treat accordingly based on results -Chest x-ray pending -We will continue to follow closely as patient has a cardiac history with stent placement and MI.  He is followed outpatient by Dr. Lady Gary.  2.  Diabetes mellitus - Metformin restarted - He is on sliding scale insulin  3.  History of DVT of right lower extremity -Continue Coumadin as per orthopedic recommendations -INR in the a.m.  4.  Obstructive sleep apnea - Patient will need to get the CPAP from home for use during hours of sleep  5.  GERD - Nexium continued  6.  Hyperlipidemia - Lipitor continued  DVT and PPI prophylaxis continued  We thank you for this consultation we will continue to follow along with you during this patient's hospitalization until disposition home.  Patient seen and examined agree with the plan formulated by Janeann Merl will monitor on monitor telemetry     All the records are reviewed and case discussed with Consulting provider. Management plans discussed with the patient, family and they are in agreement.  CODE STATUS: Full code TOTAL TIME TAKING CARE OF THIS PATIENT: 45 minutes.    Milas Kocher Seals CRNPon 05/05/2019 at 6:54 PM  Between 7am to 6pm - Pager - [phone]  After 6pm go to [email or web address] - password [email or web address]  [place] [facility]  Office  [phone]  CC: Primary care Physician: Marisue Ivan, MD

## 2019-05-05 NOTE — Anesthesia Post-op Follow-up Note (Signed)
Anesthesia QCDR form completed.        

## 2019-05-06 ENCOUNTER — Encounter: Payer: Self-pay | Admitting: Surgery

## 2019-05-06 DIAGNOSIS — Z89412 Acquired absence of left great toe: Secondary | ICD-10-CM

## 2019-05-06 DIAGNOSIS — Z9641 Presence of insulin pump (external) (internal): Secondary | ICD-10-CM

## 2019-05-06 DIAGNOSIS — Z794 Long term (current) use of insulin: Secondary | ICD-10-CM

## 2019-05-06 DIAGNOSIS — E119 Type 2 diabetes mellitus without complications: Secondary | ICD-10-CM

## 2019-05-06 DIAGNOSIS — I2510 Atherosclerotic heart disease of native coronary artery without angina pectoris: Secondary | ICD-10-CM

## 2019-05-06 DIAGNOSIS — Z89422 Acquired absence of other left toe(s): Secondary | ICD-10-CM

## 2019-05-06 DIAGNOSIS — I4891 Unspecified atrial fibrillation: Secondary | ICD-10-CM

## 2019-05-06 DIAGNOSIS — Z7901 Long term (current) use of anticoagulants: Secondary | ICD-10-CM

## 2019-05-06 DIAGNOSIS — Z86718 Personal history of other venous thrombosis and embolism: Secondary | ICD-10-CM

## 2019-05-06 DIAGNOSIS — Z8631 Personal history of diabetic foot ulcer: Secondary | ICD-10-CM

## 2019-05-06 DIAGNOSIS — Z96651 Presence of right artificial knee joint: Secondary | ICD-10-CM

## 2019-05-06 DIAGNOSIS — T8453XA Infection and inflammatory reaction due to internal right knee prosthesis, initial encounter: Principal | ICD-10-CM

## 2019-05-06 DIAGNOSIS — Z79899 Other long term (current) drug therapy: Secondary | ICD-10-CM

## 2019-05-06 DIAGNOSIS — Z955 Presence of coronary angioplasty implant and graft: Secondary | ICD-10-CM

## 2019-05-06 LAB — CBC WITH DIFFERENTIAL/PLATELET
Abs Immature Granulocytes: 0.05 10*3/uL (ref 0.00–0.07)
Basophils Absolute: 0 10*3/uL (ref 0.0–0.1)
Basophils Relative: 0 %
Eosinophils Absolute: 0 10*3/uL (ref 0.0–0.5)
Eosinophils Relative: 0 %
HCT: 38.1 % — ABNORMAL LOW (ref 39.0–52.0)
Hemoglobin: 12.2 g/dL — ABNORMAL LOW (ref 13.0–17.0)
Immature Granulocytes: 0 %
Lymphocytes Relative: 7 %
Lymphs Abs: 0.8 10*3/uL (ref 0.7–4.0)
MCH: 28.8 pg (ref 26.0–34.0)
MCHC: 32 g/dL (ref 30.0–36.0)
MCV: 89.9 fL (ref 80.0–100.0)
Monocytes Absolute: 0.9 10*3/uL (ref 0.1–1.0)
Monocytes Relative: 8 %
Neutro Abs: 10.5 10*3/uL — ABNORMAL HIGH (ref 1.7–7.7)
Neutrophils Relative %: 85 %
Platelets: 213 10*3/uL (ref 150–400)
RBC: 4.24 MIL/uL (ref 4.22–5.81)
RDW: 14.9 % (ref 11.5–15.5)
WBC: 12.4 10*3/uL — ABNORMAL HIGH (ref 4.0–10.5)
nRBC: 0 % (ref 0.0–0.2)

## 2019-05-06 LAB — BASIC METABOLIC PANEL
Anion gap: 6 (ref 5–15)
BUN: 20 mg/dL (ref 8–23)
CO2: 23 mmol/L (ref 22–32)
Calcium: 8.7 mg/dL — ABNORMAL LOW (ref 8.9–10.3)
Chloride: 108 mmol/L (ref 98–111)
Creatinine, Ser: 0.94 mg/dL (ref 0.61–1.24)
GFR calc Af Amer: >60 mL/min (ref >60)
GFR calc non Af Amer: >60 mL/min (ref >60)
Glucose, Bld: 163 mg/dL — ABNORMAL HIGH (ref 70–99)
Potassium: 4.7 mmol/L (ref 3.5–5.1)
Sodium: 137 mmol/L (ref 135–145)

## 2019-05-06 LAB — GLUCOSE, CAPILLARY
Glucose-Capillary: 148 mg/dL — ABNORMAL HIGH (ref 70–99)
Glucose-Capillary: 175 mg/dL — ABNORMAL HIGH (ref 70–99)
Glucose-Capillary: 76 mg/dL (ref 70–99)
Glucose-Capillary: 39 mg/dL — CL (ref 70–99)
Glucose-Capillary: 53 mg/dL — ABNORMAL LOW (ref 70–99)
Glucose-Capillary: 83 mg/dL (ref 70–99)

## 2019-05-06 LAB — PROTIME-INR
INR: 1.2 (ref 0.8–1.2)
Prothrombin Time: 14.7 seconds (ref 11.4–15.2)

## 2019-05-06 MED ORDER — INSULIN PUMP
Freq: Three times a day (TID) | SUBCUTANEOUS | Status: DC
  Administered 2019-05-06: 19:00:00 20 via SUBCUTANEOUS
  Administered 2019-05-07: 19:00:00 8.3 via SUBCUTANEOUS
  Administered 2019-05-07: 13:00:00 17.3 via SUBCUTANEOUS
  Administered 2019-05-07: 09:00:00 8.1 via SUBCUTANEOUS
  Administered 2019-05-08: 18:00:00 3.7 via SUBCUTANEOUS
  Administered 2019-05-08: 0.3 via SUBCUTANEOUS
  Administered 2019-05-08: 13:00:00 10.5 via SUBCUTANEOUS
  Filled 2019-05-06: qty 1

## 2019-05-06 MED ORDER — SODIUM CHLORIDE 0.9 % IV SOLN
2.0000 g | INTRAVENOUS | Status: DC
  Administered 2019-05-06 – 2019-05-08 (×3): 2 g via INTRAVENOUS
  Filled 2019-05-06: qty 20
  Filled 2019-05-06: qty 2
  Filled 2019-05-06 (×2): qty 20
  Filled 2019-05-06: qty 2

## 2019-05-06 MED ORDER — WARFARIN SODIUM 5 MG PO TABS
5.0000 mg | ORAL_TABLET | Freq: Once | ORAL | Status: AC
  Administered 2019-05-06: 5 mg via ORAL
  Filled 2019-05-06: qty 1

## 2019-05-06 NOTE — NC FL2 (Signed)
[place] MEDICAID FL2 LEVEL OF CARE SCREENING TOOL     IDENTIFICATION  Patient Name: Carl Yang Birthdate: [birth year] Sex: male Admission Date (Current Location): 05/05/2019  [place] Number:  Chiropodist and Address:  [facility], [address], [place], [place] [address]      Provider Number: [id number]  Attending Physician Name and Address:  Christena Flake, MD  Relative Name and Phone Number:  Christian Mate [phone]    Current Level of Care: Hospital Recommended Level of Care: Skilled Nursing Facility Prior Approval Number:    Date Approved/Denied:   PASRR Number: [id number]  Discharge Plan: SNF    Current Diagnoses: Patient Active Problem List   Diagnosis Date Noted  . Status post revision of total knee replacement, right 05/05/2019  . 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 Hypertension associated with type 1 diabetes mellitus (HCC) 07/28/2018  . OSA on CPAP 07/28/2018  . GERD without esophagitis 07/28/2018  . Chronic non-seasonal allergic rhinitis 07/28/2018  . Chronic constipation 07/28/2018  . Primary osteoarthritis of right knee 07/28/2018  . 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 Status post total knee replacement using cement, right 07/15/2018    Orientation RESPIRATION BLADDER Height & Weight     Self, Time, Situation, Place  Normal Continent Weight: 101 kg Height:  6\' 3"  (190.5 cm)  BEHAVIORAL SYMPTOMS/MOOD NEUROLOGICAL BOWEL NUTRITION STATUS      Continent    AMBULATORY STATUS COMMUNICATION OF NEEDS Skin   Extensive Assist Verbally Normal, Surgical wounds                       Personal Care Assistance Level of Assistance  Bathing, Dressing Bathing Assistance: Independent   Dressing  Assistance: Limited assistance     Functional Limitations Info  Sight, Hearing, Speech Sight Info: Adequate Hearing Info: Adequate Speech Info: Adequate    SPECIAL CARE FACTORS FREQUENCY  PT (By licensed PT)(IV ABX)     PT Frequency: 5 times per week              Contractures Contractures Info: Not present    Additional Factors Info  Code Status, Allergies Code Status Info: Full code Allergies Info: NKDA           Current Medications (05/06/2019):  This is the current hospital active medication list Current Facility-Administered Medications  Medication Dose Route Frequency Provider Last Rate Last Dose  . 0.9 %  sodium chloride infusion   Intravenous Continuous Poggi, Excell Seltzer, MD 75 mL/hr at 05/06/19 0539    . acetaminophen (TYLENOL) tablet 1,000 mg  1,000 mg Oral Q6H Poggi, Excell Seltzer, MD   1,000 mg at 05/06/19 0527  . acetaminophen (TYLENOL) tablet 325 mg  325 mg Oral Q6H PRN Poggi, Excell Seltzer, MD      . albuterol (PROVENTIL) (2.5 MG/3ML) 0.083% nebulizer solution 2.5 mg  2.5 mg Inhalation Q6H PRN Poggi, Excell Seltzer, MD      . atorvastatin (LIPITOR) tablet 80 mg  80 mg Oral Daily Poggi, Excell Seltzer, MD   80 mg at 05/05/19 1842  . bisacodyl (DULCOLAX) suppository 10 mg  10 mg Rectal Daily PRN Poggi, Excell Seltzer, MD      . diphenhydrAMINE (BENADRYL) 12.5 MG/5ML elixir 12.5 mg  12.5 mg Oral Q4H PRN  Poggi, Excell Seltzer, MD      . docusate sodium (COLACE) capsule 100 mg  100 mg Oral BID Christena Flake, MD   100 mg at 05/06/19 0825  . enoxaparin (LOVENOX) injection 40 mg  40 mg Subcutaneous Q24H Poggi, Excell Seltzer, MD   40 mg at 05/06/19 0825  . HYDROmorphone (DILAUDID) injection 0.5 mg  0.5 mg Intravenous Q4H PRN Poggi, Excell Seltzer, MD      . insulin aspart (novoLOG) injection 0-15 Units  0-15 Units Subcutaneous TID WC Poggi, Excell Seltzer, MD      . insulin lispro (HUMALOG) injection 150 Units  150 Units Subcutaneous Continuous Poggi, Excell Seltzer, MD      . ketorolac (TORADOL) 15 MG/ML injection 7.5 mg  7.5 mg Intravenous Q6H  Poggi, Excell Seltzer, MD   7.5 mg at 05/06/19 0527  . loratadine (CLARITIN) tablet 10 mg  10 mg Oral Daily Poggi, Excell Seltzer, MD   10 mg at 05/06/19 0826  . magnesium hydroxide (MILK OF MAGNESIA) suspension 30 mL  30 mL Oral Daily PRN Poggi, Excell Seltzer, MD      . metFORMIN (GLUCOPHAGE-XR) 24 hr tablet 500 mg  500 mg Oral BID WC Poggi, Excell Seltzer, MD   500 mg at 05/06/19 0825  . metoCLOPramide (REGLAN) tablet 5 mg  5 mg Oral Q8H PRN Poggi, Excell Seltzer, MD       Or  . metoCLOPramide (REGLAN) injection 5 mg  5 mg Intravenous Q8H PRN Poggi, Excell Seltzer, MD      . multivitamin with minerals tablet   Oral Daily Poggi, Excell Seltzer, MD   1 tablet at 05/06/19 0825  . ondansetron (ZOFRAN) tablet 4 mg  4 mg Oral Q6H PRN Poggi, Excell Seltzer, MD       Or  . ondansetron (ZOFRAN) injection 4 mg  4 mg Intravenous Q6H PRN Poggi, Excell Seltzer, MD      . oxyCODONE (Oxy IR/ROXICODONE) immediate release tablet 5 mg  5 mg Oral Q4H PRN Poggi, Excell Seltzer, MD   5 mg at 05/06/19 1021  . pantoprazole (PROTONIX) EC tablet 40 mg  40 mg Oral Daily Poggi, Excell Seltzer, MD   40 mg at 05/06/19 0826  . ramipril (ALTACE) capsule 2.5 mg  2.5 mg Oral QPM Poggi, Excell Seltzer, MD   2.5 mg at 05/05/19 1843  . sodium phosphate (FLEET) 7-19 GM/118ML enema 1 enema  1 enema Rectal Once PRN Poggi, Excell Seltzer, MD      . traMADol Janean Sark) tablet 50 mg  50 mg Oral Q6H Poggi, Excell Seltzer, MD   50 mg at 05/06/19 0528  . vancomycin (VANCOCIN) 1,250 mg in sodium chloride 0.9 % 250 mL IVPB  1,250 mg Intravenous Q12H Katha Cabal, RPH 166.7 mL/hr at 05/06/19 0619 1,250 mg at 05/06/19 0619  . warfarin (COUMADIN) tablet 5 mg  5 mg Oral ONCE-1800 Nazari, Walid A, RPH      . Warfarin - Pharmacist Dosing Inpatient   Does not apply q1800 Poggi, Excell Seltzer, MD         Discharge Medications: Please see discharge summary for a list of discharge medications.  Relevant Imaging Results:  Relevant Lab Results:   Additional Information [id number]  Barrie Dunker, RN

## 2019-05-06 NOTE — Consult Note (Signed)
NAME: Carl Yang  DOB: May 25, 1941  MRN: [id number]  Date/Time: 05/06/2019 10:44 AM  REQUESTING PROVIDER: Dr. Joice Lofts Subjective:  REASON FOR CONSULT: Right knee prosthetic joint infection ? Carl Yang is a 78 y.o. male with a history of coronary artery disease status post stent, diabetes mellitus, right leg DVT, left toes amputation, right total knee arthroplasty October 2019 was admitted to the hospital because of infection of the prosthetic joint and need for surgery to remove the joint.  Patient had right knee replacement October 2019.  Post surgery he was doing fairly well and was undergoing rehab.  He then went to [place] over the winter.  Mid-March when he was walking in the parking lot between 2 cars he suddenly felt his right knee buckling and had severe pain.  He did not fall.  There was no erythema or discharge from the surgical site.  He called his orthopedic surgeon from [place].  Patient says he was given 20 days of antibiotic but I do not see it in records.  When he returned from [place] he went and saw Dr. Joice Lofts   A sed rate and CRP  drawn May 2020  were normal.  As the symptoms continued he underwent arthrocentesis on 04/03/2019 and the fluid showed 44,000 white cells predominantly neutrophils which was 99%.  The synovial fluid culture was negative..No crystals were seen.ESR was 17.  He was thought to have septic arthritis with low purulent organism and was admitted to the hospital to undergo surgery. Patient underwent 1 stage revision right TKA on 05/05/2019.  I am asked to see the patient for antibiotic recommendation.  Past medical history Atrial fibrillation Anemia Benign neoplasm of large bowel BPH DVT Coronary artery disease status post stent.  Diabetes mellitus Dyslipidemia GERD BPH Osteoarthritis Renal insufficiency Sleep apnea Melanoma skin  Past surgical history Amputation of the left toe fourth and fifth in 2004 Amputation of left great toe on 03/2010  Bilateral cataract extraction Polyp removal Fusion of the lumbar spine L4-L5 with metal implant Right TKA on 07/15/2018 Melanoma excision on 12/24/2014    Social history Retired Education officer, community Lives on his own Non-smoker Occasional alcohol Son a Optometrist.    Family history Cancer mother Alzheimer's father Breast cancer sister Wife had leukemia   Home meds Insulin Metformin Atorvastatin Coumadin Ramipril Nexium Fexofenadine   No Known Allergies  ? Current Facility-Administered Medications  Medication Dose Route Frequency Provider Last Rate Last Dose  . 0.9 %  sodium chloride infusion   Intravenous Continuous Poggi, Excell Seltzer, MD 75 mL/hr at 05/06/19 0539    . acetaminophen (TYLENOL) tablet 1,000 mg  1,000 mg Oral Q6H Poggi, Excell Seltzer, MD   1,000 mg at 05/06/19 0527  . acetaminophen (TYLENOL) tablet 325 mg  325 mg Oral Q6H PRN Poggi, Excell Seltzer, MD      . albuterol (PROVENTIL) (2.5 MG/3ML) 0.083% nebulizer solution 2.5 mg  2.5 mg Inhalation Q6H PRN Poggi, Excell Seltzer, MD      . atorvastatin (LIPITOR) tablet 80 mg  80 mg Oral Daily Poggi, Excell Seltzer, MD   80 mg at 05/05/19 1842  . bisacodyl (DULCOLAX) suppository 10 mg  10 mg Rectal Daily PRN Poggi, Excell Seltzer, MD      . diphenhydrAMINE (BENADRYL) 12.5 MG/5ML elixir 12.5 mg  12.5 mg Oral Q4H PRN Poggi, Excell Seltzer, MD      . docusate sodium (COLACE) capsule 100 mg  100 mg Oral BID Poggi, Excell Seltzer, MD   100 mg at  05/06/19 0825  . enoxaparin (LOVENOX) injection 40 mg  40 mg Subcutaneous Q24H Poggi, Excell Seltzer, MD   40 mg at 05/06/19 0825  . HYDROmorphone (DILAUDID) injection 0.5 mg  0.5 mg Intravenous Q4H PRN Poggi, Excell Seltzer, MD      . insulin aspart (novoLOG) injection 0-15 Units  0-15 Units Subcutaneous TID WC Poggi, Excell Seltzer, MD      . insulin lispro (HUMALOG) injection 150 Units  150 Units Subcutaneous Continuous Poggi, Excell Seltzer, MD      . ketorolac (TORADOL) 15 MG/ML injection 7.5 mg  7.5 mg Intravenous Q6H Poggi, Excell Seltzer, MD   7.5 mg at 05/06/19 0527  .  loratadine (CLARITIN) tablet 10 mg  10 mg Oral Daily Poggi, Excell Seltzer, MD   10 mg at 05/06/19 0826  . magnesium hydroxide (MILK OF MAGNESIA) suspension 30 mL  30 mL Oral Daily PRN Poggi, Excell Seltzer, MD      . metFORMIN (GLUCOPHAGE-XR) 24 hr tablet 500 mg  500 mg Oral BID WC Poggi, Excell Seltzer, MD   500 mg at 05/06/19 0825  . metoCLOPramide (REGLAN) tablet 5 mg  5 mg Oral Q8H PRN Poggi, Excell Seltzer, MD       Or  . metoCLOPramide (REGLAN) injection 5 mg  5 mg Intravenous Q8H PRN Poggi, Excell Seltzer, MD      . multivitamin with minerals tablet   Oral Daily Poggi, Excell Seltzer, MD   1 tablet at 05/06/19 0825  . ondansetron (ZOFRAN) tablet 4 mg  4 mg Oral Q6H PRN Poggi, Excell Seltzer, MD       Or  . ondansetron (ZOFRAN) injection 4 mg  4 mg Intravenous Q6H PRN Poggi, Excell Seltzer, MD      . oxyCODONE (Oxy IR/ROXICODONE) immediate release tablet 5 mg  5 mg Oral Q4H PRN Poggi, Excell Seltzer, MD   5 mg at 05/06/19 1021  . pantoprazole (PROTONIX) EC tablet 40 mg  40 mg Oral Daily Poggi, Excell Seltzer, MD   40 mg at 05/06/19 0826  . ramipril (ALTACE) capsule 2.5 mg  2.5 mg Oral QPM Poggi, Excell Seltzer, MD   2.5 mg at 05/05/19 1843  . sodium phosphate (FLEET) 7-19 GM/118ML enema 1 enema  1 enema Rectal Once PRN Poggi, Excell Seltzer, MD      . traMADol Janean Sark) tablet 50 mg  50 mg Oral Q6H Poggi, Excell Seltzer, MD   50 mg at 05/06/19 0528  . vancomycin (VANCOCIN) 1,250 mg in sodium chloride 0.9 % 250 mL IVPB  1,250 mg Intravenous Q12H Katha Cabal, RPH 166.7 mL/hr at 05/06/19 0619 1,250 mg at 05/06/19 0619  . warfarin (COUMADIN) tablet 5 mg  5 mg Oral ONCE-1800 Nazari, Walid A, RPH      . Warfarin - Pharmacist Dosing Inpatient   Does not apply q1800 Poggi, Excell Seltzer, MD         Abtx:  Anti-infectives (From admission, onward)   Start     Dose/Rate Route Frequency Ordered Stop   05/06/19 0600  vancomycin (VANCOCIN) 1,250 mg in sodium chloride 0.9 % 250 mL IVPB     1,250 mg 166.7 mL/hr over 90 Minutes Intravenous Every 12 hours 05/05/19 1955     05/05/19 1930  vancomycin  (VANCOCIN) IVPB 1000 mg/200 mL premix     1,000 mg 200 mL/hr over 60 Minutes Intravenous  Once 05/05/19 1929 05/05/19 2138   05/05/19 1745  vancomycin (VANCOCIN) IVPB 1000 mg/200 mL premix  Status:  Discontinued  Note to Pharmacy: Please dose vancomycin as appropriate. Continue antibiotic regimen as recommended by Infectious Disease consultation.   1,000 mg 200 mL/hr over 60 Minutes Intravenous Every 12 hours 05/05/19 1739 05/05/19 1919   05/05/19 0600  vancomycin (VANCOCIN) IVPB 1000 mg/200 mL premix     1,000 mg 200 mL/hr over 60 Minutes Intravenous 60 min pre-op 05/04/19 2147 05/05/19 1038   05/04/19 2145  vancomycin (VANCOCIN) 1,000 mg in sodium chloride 0.9 % 250 mL IVPB  Status:  Discontinued     1,000 mg 250 mL/hr over 60 Minutes Intravenous  Once 05/04/19 2133 05/04/19 2144      REVIEW OF SYSTEMS:  Const: negative fever, negative chills, negative weight loss Eyes: negative diplopia or visual changes, negative eye pain ENT: negative coryza, negative sore throat Resp: negative cough, hemoptysis, dyspnea Cards: negative for chest pain, palpitations, lower extremity edema GU: negative for frequency, dysuria and hematuria GI: Negative for abdominal pain, diarrhea, bleeding, constipation Skin: negative for rash and pruritus Heme: negative for easy bruising and gum/nose bleeding MS: negative for myalgias, , back pain some muscle weakness Neurolo:negative for headaches, dizziness, vertigo, memory problems  Psych: negative for feelings of anxiety, depression  Endocrine: No polyuria or polydipsia allergy/Immunology- negative for any medication or food allergies  Objective:  VITALS:  BP 126/76 (BP Location: Left Arm)   Pulse (!) 49   Temp 97.8 F (36.6 C)   Resp 16   Ht 6\' 3"  (1.905 m)   Wt 101 kg   SpO2 100%   BMI 27.83 kg/m  PHYSICAL EXAM:  General: Alert, cooperative, no distress, appears stated age.  Head: Normocephalic, without obvious abnormality, atraumatic. Eyes:  Conjunctivae clear, anicteric sclerae. Pupils are equal ENT Nares normal. No drainage or sinus tenderness. Lips, mucosa, and tongue normal. No Thrush Neck: Supple, symmetrical, no adenopathy, thyroid: non tender no carotid bruit and no JVD. Back: No CVA tenderness. Lungs: Clear to auscultation bilaterally. No Wheezing or Rhonchi. No rales. Heart: Regular rate and rhythm, no murmur, rub or gallop. Abdomen: Soft, non-tender,not distended. Bowel sounds normal. No masses Extremities: Right knee cold wrap not removed Right leg bigger than the left especially in the calf area Left foot: Great toe amputation, and partial amputation of second, third, fourth and fifth toes skin: No rashes or lesions. Or bruising Lymph: Cervical, supraclavicular normal. Neurologic: Grossly non-focal Pertinent Labs Lab Results CBC    Component Value Date/Time   WBC 12.4 (H) 05/06/2019 0443   RBC 4.24 05/06/2019 0443   HGB 12.2 (L) 05/06/2019 0443   HCT 38.1 (L) 05/06/2019 0443   PLT 213 05/06/2019 0443   MCV 89.9 05/06/2019 0443   MCH 28.8 05/06/2019 0443   MCHC 32.0 05/06/2019 0443   RDW 14.9 05/06/2019 0443   LYMPHSABS 0.8 05/06/2019 0443   MONOABS 0.9 05/06/2019 0443   EOSABS 0.0 05/06/2019 0443   BASOSABS 0.0 05/06/2019 0443    CMP Latest Ref Rng & Units 05/06/2019 05/05/2019 04/28/2019  Glucose 70 - 99 mg/dL 469(G) 295(M) 841(L)  BUN 8 - 23 mg/dL 20 18 24(M)  Creatinine 0.61 - 1.24 mg/dL 0.10 2.72 5.36  Sodium 135 - 145 mmol/L 137 136 139  Potassium 3.5 - 5.1 mmol/L 4.7 4.3 4.2  Chloride 98 - 111 mmol/L 108 109 108  CO2 22 - 32 mmol/L 23 21(L) 24  Calcium 8.9 - 10.3 mg/dL 6.4(Q) 0.3(K) 7.4(Q)      Microbiology: Recent Results (from the past 240 hour(s))  Surgical pcr screen  Status: None   Collection Time: 04/28/19 11:40 AM   Specimen: Nasal Mucosa; Nasal Swab  Result Value Ref Range Status   MRSA, PCR NEGATIVE NEGATIVE Final   Staphylococcus aureus NEGATIVE NEGATIVE Final    Comment:  (NOTE) The Xpert SA Assay (FDA approved for NASAL specimens in patients 49 years of age and older), is one component of a comprehensive surveillance program. It is not intended to diagnose infection nor to guide or monitor treatment. Performed at [facility], [address] [place], [place] [address]   Urine culture     Status: None   Collection Time: 04/28/19 11:40 AM   Specimen: Urine, Random  Result Value Ref Range Status   Specimen Description   Final    URINE, RANDOM Performed at [facility], [address], [place], [place] [address]    Special Requests   Final    Normal Performed at [facility], [address], [place], [place] [address]    Culture   Final    NO GROWTH Performed at [facility], [address] [place], [place] [address]    Report Status 04/29/2019 FINAL  Final  SARS CORONAVIRUS 2 Nasal Swab Aptima Multi Swab     Status: None   Collection Time: 05/01/19  8:43 AM   Specimen: Aptima Multi Swab; Nasal Swab  Result Value Ref Range Status   SARS Coronavirus 2 NEGATIVE NEGATIVE Final    Comment: (NOTE) SARS-CoV-2 target nucleic acids are NOT DETECTED. The SARS-CoV-2 RNA is generally detectable in upper and lower respiratory specimens during the acute phase of infection. Negative results do not preclude SARS-CoV-2 infection, do not rule out co-infections with other pathogens, and should not be used as the sole basis for treatment or other patient management decisions. Negative results must be combined with clinical observations, patient history, and epidemiological information. The expected result is Negative. Fact Sheet for Patients: HairSlick.no Fact Sheet for Healthcare Providers: quierodirigir.com This test is not yet approved or cleared by the [place] FDA and  has been authorized for detection and/or diagnosis of SARS-CoV-2 by FDA under an Emergency  Use Authorization (EUA). This EUA will remain  in effect (meaning this test can be used) for the duration of the COVID-19 declaration under Section 56 4(b)(1) of the Act, 21 U.S.C. section 360bbb-3(b)(1), unless the authorization is terminated or revoked sooner. Performed at [facility], [address] [place], [place] [address]   Aerobic/Anaerobic Culture (surgical/deep wound)     Status: None (Preliminary result)   Collection Time: 05/05/19 11:55 AM   Specimen: [facility] Cytology Misc. fluid; Body Fluid  Result Value Ref Range Status   Specimen Description   Final    KNEE RIGHT Performed at [facility], [address] [place], [place] [address]    Special Requests   Final    SWAB Performed at [facility], [address] [place], [place] [address]    Gram Stain   Final    MODERATE WBC PRESENT,BOTH PMN AND MONONUCLEAR NO ORGANISMS SEEN    Culture   Final    NO GROWTH < 12 HOURS Performed at [facility], [address] [place], [place] [address]    Report Status PENDING  Incomplete  Aerobic/Anaerobic Culture (surgical/deep wound)     Status: None (Preliminary result)   Collection Time: 05/05/19 11:55 AM   Specimen: [facility] Cytology Misc. fluid; Body Fluid  Result Value Ref Range Status   Specimen  Description TISSUE RIGHT KNEE  Final   Special Requests NONE  Final   Gram Stain   Final    MODERATE WBC PRESENT,BOTH PMN AND MONONUCLEAR NO ORGANISMS SEEN    Culture   Final    NO GROWTH < 12 HOURS Performed at [facility], [address], [place], [place] [address]    Report Status PENDING  Incomplete    IMAGING RESULTS:  I have personally reviewed the films ?Hardware in the knee   Impression/Recommendation ? 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 Status post 1 stage revision with removal of hardware and placement of new arthroplasty. Prior to culture from the synovial fluid done as outpatient without any antibiotics was  negative.  But the cell count was significant for neutrophils. Currently on vancomycin.  We will continue that as concern for coag negative staph.  We will add ceftriaxone for gram-negative coverage. As Pseudomonas was not cultured cefepime is not required. We will also get fungal and AFB culture from the tissue sent from the OR.Marland Kitchen He will a minimum of 6 weeks of IV antibiotics and  oral antibiotics following that    Diabetes mellitus on insulin as well as on metformin.  Has an insulin pump.  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  On Coumadin..    A. fib on Coumadin  ? ___________________________________________________ Discussed with patient, requesting provider  Note:  This document was prepared using Dragon voice recognition software and may include unintentional dictation errors.

## 2019-05-06 NOTE — Progress Notes (Signed)
Subjective: 1 Day Post-Op Procedure(s) (LRB): TOTAL KNEE REVISION (Right) Patient reports pain as mild.   Patient is well, and has had no acute complaints or problems PT and care management to assist with discharge planning. ID has been consulted for the patient as well.  Plan for IV antibiotics following discharge. Negative for chest pain and shortness of breath Fever: no Gastrointestinal:Negative for nausea and vomiting  Objective: Vital signs in last 24 hours: Temp:  [97.1 F (36.2 C)-98.6 F (37 C)] 97.8 F (36.6 C) (08/19 0810) Pulse Rate:  [49-86] 49 (08/19 0810) Resp:  [15-26] 16 (08/19 0810) BP: (110-162)/(54-96) 126/76 (08/19 0810) SpO2:  [94 %-100 %] 100 % (08/19 0810)  Intake/Output from previous day:  Intake/Output Summary (Last 24 hours) at 05/06/2019 1307 Last data filed at 05/06/2019 0519 Gross per 24 hour  Intake 1492.5 ml  Output 1300 ml  Net 192.5 ml    Intake/Output this shift: No intake/output data recorded.  Labs: Recent Labs    05/05/19 1852 05/06/19 0443  HGB 13.0 12.2*   Recent Labs    05/05/19 1852 05/06/19 0443  WBC 10.6* 12.4*  RBC 4.48 4.24  HCT 40.7 38.1*  PLT 216 213   Recent Labs    05/05/19 1851 05/06/19 0443  NA 136 137  K 4.3 4.7  CL 109 108  CO2 21* 23  BUN 18 20  CREATININE 1.00 0.94  GLUCOSE 189* 163*  CALCIUM 8.4* 8.7*   Recent Labs    05/05/19 0901 05/06/19 0443  INR 1.2 1.2     EXAM General - Patient is Alert, Appropriate and Oriented Extremity - ABD soft Sensation intact distally Intact pulses distally Dorsiflexion/Plantar flexion intact Incision: dressing C/D/I No cellulitis present Dressing/Incision - clean, dry, no drainage Motor Function - intact, moving foot and toes well on exam.   Past Medical History:  Diagnosis Date  . Arthritis   . Cancer ([place])    Melanoma  . Complication of anesthesia    After surgery at [facility], the patient coded during the night.  . Coronary artery disease    Stents  . Diabetes mellitus without complication ([place])   . DVT (deep venous thrombosis) ([facility])    Right lower limb  . Dyspnea   . GERD (gastroesophageal reflux disease)   . Myocardial infarction ([place])    1998, 2003  . Neuromuscular disorder ([place])    Peripheral Neuropathy r/t DM  . Peripheral vascular disease ([place])    Diabetes  . Sleep apnea    Uses CPAP    Assessment/Plan: 1 Day Post-Op Procedure(s) (LRB): TOTAL KNEE REVISION (Right) Active Problems:   Status post revision of total knee replacement, right  Estimated body mass index is 27.83 kg/m as calculated from the following:   Height as of this encounter: 6\' 3"  (1.905 m).   Weight as of this encounter: 101 kg. Advance diet Up with therapy D/C IV fluids when tolerating po intake.  Labs reviewed this AM.  Hg 12.2 this AM.  WBC 12.4  Continue to monitor.  CBC and BMP tomorrow morning. ID has been consulted.  Cultures obtained during surgery are pending at this time. Continue with PT.   Begin working on having a BM. Continue Lovenox until Warfarin is at therapeutic dose.  DVT Prophylaxis - Lovenox, Coumadin, Foot Pumps and TED hose Weight-Bearing as tolerated to right leg  J. Cameron Proud, PA-C [facility] Orthopaedic Surgery 05/06/2019, 1:07 PM

## 2019-05-06 NOTE — Consult Note (Signed)
[place] for Warfarin Indication: atrial fibrillation  No Known Allergies  Patient Measurements: Height: 6\' 3"  (190.5 cm) Weight: 222 lb 10.6 oz (101 kg) IBW/kg (Calculated) : 84.5 Heparin Dosing Weight:   Vital Signs: Temp: 97.8 F (36.6 C) (08/19 0810) Temp Source: Oral (08/19 0040) BP: 126/76 (08/19 0810) Pulse Rate: 49 (08/19 0810)  Labs: Recent Labs    05/05/19 0901 05/05/19 1851 05/05/19 1852 05/05/19 2017 05/06/19 0443  HGB  --   --  13.0  --  12.2*  HCT  --   --  40.7  --  38.1*  PLT  --   --  216  --  213  LABPROT 15.5*  --   --   --  14.7  INR 1.2  --   --   --  1.2  CREATININE  --  1.00  --   --  0.94  TROPONINIHS  --  14  --  14  --     Estimated Creatinine Clearance: 79.9 mL/min (by C-G formula based on SCr of 0.94 mg/dL).   Medications:  Warfarin 3 mg ever Mon, Tues, Wed, Thurs, Fri - last dose 8/13 in preparation for knee surgery  Warfarin 4 mg Sat and Sun- last dose 8/9 in preparation for knee surgery  Enoxaparin 40 mg Q24H- scheduled to start tomorrow morning.   Assessment: Patient had TKA today. Discussed with Dr. Roland Rack about starting warfarin tonight and he confirmed to resume warfarin for this evening dose. Will follow up on baseline CBC.   8/18: INR 1.2 8/19: INR 1.2  Goal of Therapy:  INR 2-3 Monitor platelets by anticoagulation protocol: Yes   Plan: INR of 1.2 is subtherapeutic. Will reorder 5mg  warfarin dose tonight. Patient receiving bridge therapy of 40mg  LMWH daily currently while on warfarin. Will recheck INR and CBC with AM labs, and order CBC at least once every 3 days per protocol.    Beverlie Kurihara A Addisen Chappelle 05/06/2019,9:36 AM

## 2019-05-06 NOTE — Progress Notes (Signed)
Inpatient Diabetes Program Recommendations  AACE/ADA: New Consensus Statement on Inpatient Glycemic Control (2015)  Target Ranges:  Prepandial:   less than 140 mg/dL      Peak postprandial:   less than 180 mg/dL (1-2 hours)      Critically ill patients:  140 - 180 mg/dL   Lab Results  Component Value Date   GLUCAP 175 (H) 05/06/2019    Review of Glycemic Control Results for CHAYSON, CHARTERS (MRN [id number]) as of 05/06/2019 14:10  Ref. Range 05/05/2019 12:56 05/05/2019 16:05 05/05/2019 21:55 05/06/2019 08:09 05/06/2019 11:50  Glucose-Capillary Latest Ref Range: 70 - 99 mg/dL 90 159 (H) 256 (H) 148 (H) 175 (H)   Diabetes history: Type 1 DM Outpatient Diabetes medications: Metformin-XR 500 mg bid,  Medtronic insulin pump settings from Endocrinologist visit 01/08/19 are: He is currently is on Humalog in a Medtronic insulin pump Basal rates Midnight = 1.6 5 AM = 1.9 8:30 AM = 2.5 2 PM = 2.6 TDD basal: 54.4 Bolus settings I/C: 4 at midnight, 3 at 11 AM ISF: 13 Target Glucose: 100 Active insulin time: 5 hours  Total Daily dose approximately 120-130 units  Current orders for Inpatient glycemic control:  Novolog moderate tid with meals Insulin pump Metformin 500 mg bid  Inpatient Diabetes Program Recommendations:   Blood sugars currently well controlled.  Recommended d/c of Novolog correction since patient is on insulin pump.   Discussed insulin pump use with patient.  He has supplies and states he is due to change pump tomorrow.  He reports no needs at this time.  Will follow.   Thanks,  Adah Perl, RN, BC-ADM Inpatient Diabetes Coordinator Pager [phone] (8a-5p)

## 2019-05-06 NOTE — Progress Notes (Signed)
[employer] - [place] at [facility]   PATIENT NAME: Carl Yang    MR#:  [id number]  DATE OF BIRTH:  20-Oct-1940  SUBJECTIVE:  Patient is doing well this morning.  Patient has had a history of knee infection in the past and has had PICC line and placement in skilled nursing facility.  Patient denies fever, chest pain, dizziness or lightheadedness.  Telemetry shows normal sinus rhythm.  REVIEW OF SYSTEMS:    Review of Systems  Constitutional: Negative for fever, chills weight loss HENT: Negative for ear pain, nosebleeds, congestion, facial swelling, rhinorrhea, neck pain, neck stiffness and ear discharge.   Respiratory: Negative for cough, shortness of breath, wheezing  Cardiovascular: Negative for chest pain, palpitations and leg swelling.  Gastrointestinal: Negative for heartburn, abdominal pain, vomiting, diarrhea or consitpation Genitourinary: Negative for dysuria, urgency, frequency, hematuria Musculoskeletal: Negative for back pain or joint pain Neurological: Negative for dizziness, seizures, syncope, focal weakness,  numbness and headaches.  Hematological: Does not bruise/bleed easily.  Psychiatric/Behavioral: Negative for hallucinations, confusion, dysphoric mood    Tolerating Diet: yes      DRUG ALLERGIES:  No Known Allergies  VITALS:  Blood pressure 126/76, pulse (!) 49, temperature 97.8 F (36.6 C), resp. rate 16, height 6\' 3"  (1.905 m), weight 101 kg, SpO2 100 %.  PHYSICAL EXAMINATION:  Constitutional: Appears well-developed and well-nourished. No distress. HENT: Normocephalic. Marland Kitchen Oropharynx is clear and moist.  Eyes: Conjunctivae and EOM are normal. PERRLA, no scleral icterus.  Neck: Normal ROM. Neck supple. No JVD. No tracheal deviation. CVS: RRR, S1/S2 +, no murmurs, no gallops, no carotid bruit.  Pulmonary: Effort and breath sounds normal, no stridor, rhonchi, wheezes, rales.  Abdominal: Soft. BS +,  no distension, tenderness, rebound or  guarding.  Musculoskeletal: Right leg is placed in a big contraption with polar ice no edema and no tenderness.  Neuro: Alert. CN 2-12 grossly intact. No focal deficits. Skin: Skin is warm and dry. No rash noted. Psychiatric: Normal mood and affect.      LABORATORY PANEL:   CBC Recent Labs  Lab 05/06/19 0443  WBC 12.4*  HGB 12.2*  HCT 38.1*  PLT 213   ------------------------------------------------------------------------------------------------------------------  Chemistries  Recent Labs  Lab 05/05/19 1851 05/06/19 0443  NA 136 137  K 4.3 4.7  CL 109 108  CO2 21* 23  GLUCOSE 189* 163*  BUN 18 20  CREATININE 1.00 0.94  CALCIUM 8.4* 8.7*  MG 1.7  --    ------------------------------------------------------------------------------------------------------------------  Cardiac Enzymes No results for input(s): TROPONINI in the last 168 hours. ------------------------------------------------------------------------------------------------------------------  RADIOLOGY:  Dg Chest Port 1 View  Result Date: 05/05/2019 CLINICAL DATA:  Dyspnea. EXAM: PORTABLE CHEST 1 VIEW COMPARISON:  July 02, 2018 FINDINGS: Cardiomediastinal silhouette is normal. Mediastinal contours appear intact. Calcific atherosclerotic disease of the aorta. Hiatal hernia. There is no evidence of focal airspace consolidation, pleural effusion or pneumothorax. Osseous structures are without acute abnormality. Soft tissues are grossly normal. IMPRESSION: 1. No active disease. 2. Hiatal hernia. Electronically Signed   By: Ted Mcalpine M.D.   On: 05/05/2019 20:23   Dg Knee Right Port  Result Date: 05/05/2019 CLINICAL DATA:  Status post total right knee arthroplasty revision. EXAM: PORTABLE RIGHT KNEE - 1-2 VIEW COMPARISON:  July 15, 2018 FINDINGS: Post total right knee arthroplasty with normal alignment of the prosthetic components. No evidence of fracture. Expected soft tissue edema and emphysema.  Skin staples noted. IMPRESSION: Post total right knee arthroplasty without evidence of immediate complications. Electronically  Signed   By: Ted Mcalpine M.D.   On: 05/05/2019 16:26     ASSESSMENT AND PLAN:   78 year old male with history of CAD/stent, diabetes and previous history of total knee infection.  Hospitalist was consulted due to arrhythmia seen on monitoring.  1.  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  Repeat EKG also shows normal sinus rhythm.  Electrolytes are within normal limits.  No further follow-up for this is needed. Patient can have outpatient follow-up with Dr. Lady Gary his cardiologist.   2.  Septic right total knee: ID consultation placed by attending MD  3.  Diabetes: Diabetes nurse consultation placed Continue ADA diet with sliding scale and metformin  4.  History of DVT: Coumadin started yesterday Follow-up INR with goal 2-3  5.  OSA: CPAP at night is recommended    I will sign off.  Please do not hesitate to call if you have further questions thank you   Management plans discussed with the patient and he is in agreement.  CODE STATUS: FULL  TOTAL TIME TAKING CARE OF THIS PATIENT: 30 minutes.     POSSIBLE D/C 1-2 days, DEPENDING ON CLINICAL CONDITION.   Adrian Saran M.D on 05/06/2019 at 11:00 AM  Between 7am to 6pm - Pager - [phone] After 6pm go to [email or web address] - password [email or web address]  [facility] [place] [facility]  Office  [phone]  CC: Primary care physician; Marisue Ivan, MD  Note: This dictation was prepared with Dragon dictation along with smaller phrase technology. Any transcriptional errors that result from this process are unintentional.

## 2019-05-06 NOTE — Progress Notes (Signed)
Hypoglycemic Event  CBG: 39  Treatment: 154ml orange juice, graham crackers  Symptoms: blurred vision, jittery, nausea  Follow-up CBG: Time:       CBG Result:  Possible Reasons for Event: poor appetite today   Comments/MD notified:    Charlane Ferretti

## 2019-05-06 NOTE — Progress Notes (Signed)
Physical Therapy Treatment Patient Details Name: Carl Yang MRN: [id number] DOB: [birth year] Today's Date: 05/06/2019    History of Present Illness Pt is a 78 yo male s/p R TKA revision secondary to infection.  PMH includes: HTN, MI, cardiac stents, DM, peripheral neuropathy, L toe amputations, and OSA.  MD assessment 05/06/19 includes: Arrhythmia which is consistent with PVC: Patient is now in normal sinus rhythm, septic R total knee, DM, h/o DVT, and OSA with CPAP at night.    PT Comments    Pt presents with deficits in strength, transfers, R knee ROM, gait, balance, and activity tolerance but performed well overall during the session and is making good progress towards goals.  Pt was CGA during sit to/from stand transfer training with min verbal cues for sequencing.  Pt was able to amb 50' with a RW progressing from step-to pattern to step-through pattern with good stability throughout.  Pt's HR was in the low 80s at rest and  Increased to the low 90s during amb with no c/o adverse symptoms during the session. Pt will benefit from HHPT services upon discharge to safely address above deficits for decreased caregiver assistance and eventual return to PLOF.     Follow Up Recommendations  Home health PT     Equipment Recommendations  None recommended by PT    Recommendations for Other Services       Precautions / Restrictions Precautions Precautions: Fall Precaution Comments: Watch HR Restrictions Weight Bearing Restrictions: Yes RLE Weight Bearing: Weight bearing as tolerated    Mobility  Bed Mobility               General bed mobility comments: NT, pt in recliner  Transfers Overall transfer level: Needs assistance Equipment used: Rolling walker (2 wheeled) Transfers: Sit to/from Stand Sit to Stand: Min guard         General transfer comment: Min verbal cues for sequencing with good control  Ambulation/Gait Ambulation/Gait assistance: Min guard Gait  Distance (Feet): 50 Feet Assistive device: Rolling walker (2 wheeled) Gait Pattern/deviations: Decreased step length - left;Decreased stance time - right;Antalgic;Step-through pattern Gait velocity: decreased   General Gait Details: Gait progressed to step-through pattern with pt steady without LOB or buckling; HR low 80s at rest and low 90s after amb without adverse symptoms.   Stairs             Wheelchair Mobility    Modified Rankin (Stroke Patients Only)       Balance Overall balance assessment: Needs assistance   Sitting balance-Leahy Scale: Normal     Standing balance support: Bilateral upper extremity supported Standing balance-Leahy Scale: Good Standing balance comment: Mod lean on the RW in standing but steady without LOB                            Cognition Arousal/Alertness: Awake/alert Behavior During Therapy: WFL for tasks assessed/performed Overall Cognitive Status: Within Functional Limits for tasks assessed                                        Exercises Total Joint Exercises Ankle Circles/Pumps: AROM;Both;10 reps;5 reps Quad Sets: AROM;Strengthening;Right;10 reps;15 reps Long Arc Quad: AROM;Strengthening;Right;10 reps;15 reps Knee Flexion: AROM;Strengthening;Right;10 reps;15 reps Marching in Standing: AROM;Both;Standing;10 reps;Seated Other Exercises Other Exercises: HEP education/review for BLE APs, QS, GS, and LAQs x 10 each 5-6x/day Other  Exercises: Positioning education for R knee ext PROM    General Comments        Pertinent Vitals/Pain Pain Assessment: 0-10 Pain Score: 2  Pain Location: R knee Pain Descriptors / Indicators: Sore Pain Intervention(s): Premedicated before session;Monitored during session    Home Living                      Prior Function            PT Goals (current goals can now be found in the care plan section) Acute Rehab PT Goals Patient Stated Goal: To walk  better PT Goal Formulation: With patient Time For Goal Achievement: 05/19/19 Potential to Achieve Goals: Good Progress towards PT goals: Progressing toward goals    Frequency    BID      PT Plan Current plan remains appropriate    Co-evaluation              AM-PAC PT "6 Clicks" Mobility   Outcome Measure  Help needed turning from your back to your side while in a flat bed without using bedrails?: None Help needed moving from lying on your back to sitting on the side of a flat bed without using bedrails?: None Help needed moving to and from a bed to a chair (including a wheelchair)?: A Little Help needed standing up from a chair using your arms (e.g., wheelchair or bedside chair)?: A Little Help needed to walk in hospital room?: A Little Help needed climbing 3-5 steps with a railing? : A Little 6 Click Score: 20    End of Session Equipment Utilized During Treatment: Gait belt Activity Tolerance: Patient tolerated treatment well Patient left: in chair;with call bell/phone within reach;with chair alarm set;with SCD's reapplied;Other (comment)(Polar care donned to R knee) Nurse Communication: Mobility status PT Visit Diagnosis: Other abnormalities of gait and mobility (R26.89);Muscle weakness (generalized) (M62.81)     Time: 6962-9528 PT Time Calculation (min) (ACUTE ONLY): 24 min  Charges:  $Gait Training: 8-22 mins $Therapeutic Exercise: 8-22 mins $Therapeutic Activity: 8-22 mins                     D. Scott Kendel Pesnell PT, DPT 05/06/19, 5:35 PM

## 2019-05-06 NOTE — Evaluation (Signed)
Physical Therapy Evaluation Patient Details Name: Carl Yang MRN: [id number] DOB: January 06, 1941 Today's Date: 05/06/2019   History of Present Illness  Pt is a 78 yo male s/p R TKA revision secondary to infection.  PMH includes: HTN, MI, cardiac stents, DM, peripheral neuropathy, L toe amputations, and OSA.  MD assessment 05/06/19 includes: Arrhythmia which is consistent with PVC: Patient is now in normal sinus rhythm, septic R total knee, DM, h/o DVT, and OSA with CPAP at night.    Clinical Impression  Pt presents with deficits in strength, transfers, R knee ROM, gait, balance, and activity tolerance.  Pt was Ind with bed mobility tasks with good control of the RLE in/out of bed.  Pt was CGA with transfers with good eccentric and concentric control and min verbal cues for sequencing.  Pt was able to amb 10' with a RW with good stability and with HR and SpO2 remaining WNL on room air, nursing notified.  Pt will benefit from HHPT services upon discharge to safely address above deficits for decreased caregiver assistance and eventual return to PLOF.      Follow Up Recommendations Home health PT    Equipment Recommendations  None recommended by PT    Recommendations for Other Services       Precautions / Restrictions Precautions Precautions: Fall Precaution Comments: Watch HR Restrictions Weight Bearing Restrictions: Yes RLE Weight Bearing: Weight bearing as tolerated      Mobility  Bed Mobility Overal bed mobility: Independent             General bed mobility comments: Good effort with bed mobiltiy tasks without the need of the bed rail  Transfers Overall transfer level: Needs assistance Equipment used: Rolling walker (2 wheeled) Transfers: Sit to/from Stand Sit to Stand: Min guard;From elevated surface         General transfer comment: Min verbal cues for sequencing with good control  Ambulation/Gait Ambulation/Gait assistance: Min guard Gait Distance (Feet):  15 Feet Assistive device: Rolling walker (2 wheeled) Gait Pattern/deviations: Step-to pattern;Decreased step length - left;Decreased stance time - right;Antalgic Gait velocity: decreased   General Gait Details: Mildly antalgic step-to pattern but steady without LOB or buckling; HR and SpO2 WNL during the session with no adverse symptoms reported by pt.  Stairs            Wheelchair Mobility    Modified Rankin (Stroke Patients Only)       Balance Overall balance assessment: Needs assistance   Sitting balance-Leahy Scale: Normal     Standing balance support: Bilateral upper extremity supported Standing balance-Leahy Scale: Good Standing balance comment: Mod lean on the RW in standing but steady without LOB                             Pertinent Vitals/Pain Pain Assessment: 0-10 Pain Score: 2  Pain Descriptors / Indicators: Sore Pain Intervention(s): Premedicated before session;Monitored during session    Home Living Family/patient expects to be discharged to:: Private residence Living Arrangements: Alone Available Help at Discharge: Friend(s);Available PRN/intermittently Type of Home: Independent living facility Home Access: Level entry     Home Layout: One level Home Equipment: Walker - 2 wheels;Bedside commode;Cane - single point Additional Comments: Patient lives in independent living at [facility].    Prior Function Level of Independence: Independent         Comments: Ind amb without AD community distances, no fall history, Ind with ADLs  Hand Dominance        Extremity/Trunk Assessment   Upper Extremity Assessment Upper Extremity Assessment: Overall WFL for tasks assessed    Lower Extremity Assessment Lower Extremity Assessment: Generalized weakness;RLE deficits/detail RLE Deficits / Details: Pt able to perform Ind RLE SLR without extensor lag RLE: Unable to fully assess due to pain       Communication   Communication: No  difficulties  Cognition Arousal/Alertness: Awake/alert Behavior During Therapy: WFL for tasks assessed/performed Overall Cognitive Status: Within Functional Limits for tasks assessed                                        General Comments      Exercises Total Joint Exercises Ankle Circles/Pumps: AROM;Both;10 reps;5 reps Quad Sets: AROM;Strengthening;Right;10 reps;15 reps Gluteal Sets: Strengthening;Both;10 reps Hip ABduction/ADduction: AROM;Both;10 reps Straight Leg Raises: AROM;Both;10 reps Long Arc Quad: AROM;Strengthening;Right;10 reps;15 reps Knee Flexion: AROM;Strengthening;Right;10 reps;15 reps Marching in Standing: AROM;Both;5 reps;Standing  R knee AROM: 5-97 deg  Other Exercises: HEP education/review for BLE APs, QS, GS, and LAQs x 10 each 5-6x/day Other Exercises: Positioning education for R knee ext PROM   Assessment/Plan    PT Assessment Patient needs continued PT services  PT Problem List Decreased strength;Decreased range of motion;Decreased activity tolerance;Decreased balance;Decreased mobility       PT Treatment Interventions DME instruction;Gait training;Stair training;Functional mobility training;Therapeutic activities;Therapeutic exercise;Balance training;Patient/family education    PT Goals (Current goals can be found in the Care Plan section)  Acute Rehab PT Goals Patient Stated Goal: To walk better PT Goal Formulation: With patient Time For Goal Achievement: 05/19/19 Potential to Achieve Goals: Good    Frequency BID   Barriers to discharge        Co-evaluation               AM-PAC PT "6 Clicks" Mobility  Outcome Measure Help needed turning from your back to your side while in a flat bed without using bedrails?: None Help needed moving from lying on your back to sitting on the side of a flat bed without using bedrails?: None Help needed moving to and from a bed to a chair (including a wheelchair)?: A Little Help needed  standing up from a chair using your arms (e.g., wheelchair or bedside chair)?: A Little Help needed to walk in hospital room?: A Little Help needed climbing 3-5 steps with a railing? : A Little 6 Click Score: 20    End of Session Equipment Utilized During Treatment: Gait belt Activity Tolerance: Patient tolerated treatment well Patient left: in chair;with call bell/phone within reach;with chair alarm set;with SCD's reapplied;Other (comment)(Polar care to R knee) Nurse Communication: Mobility status PT Visit Diagnosis: Other abnormalities of gait and mobility (R26.89);Muscle weakness (generalized) (M62.81)    Time: 0865-7846 PT Time Calculation (min) (ACUTE ONLY): 49 min   Charges:   PT Evaluation $PT Eval Low Complexity: 1 Low PT Treatments $Therapeutic Exercise: 8-22 mins $Therapeutic Activity: 8-22 mins        D. Elly Modena PT, DPT 05/06/19, 1:38 PM

## 2019-05-06 NOTE — Anesthesia Postprocedure Evaluation (Signed)
Anesthesia Post Note  Patient: Carl Yang  Procedure(s) Performed: TOTAL KNEE REVISION (Right Knee)  Patient location during evaluation: Nursing Unit Anesthesia Type: Spinal Level of consciousness: awake, awake and alert and oriented Pain management: pain level controlled Vital Signs Assessment: post-procedure vital signs reviewed and stable Respiratory status: spontaneous breathing, nonlabored ventilation, respiratory function stable and patient connected to nasal cannula oxygen Cardiovascular status: blood pressure returned to baseline and stable Postop Assessment: no headache and no backache Anesthetic complications: no     Last Vitals:  Vitals:   05/06/19 0355 05/06/19 0810  BP: 110/63 126/76  Pulse: (!) 58 (!) 49  Resp: 18 16  Temp: 36.9 C 36.6 C  SpO2: 100% 100%    Last Pain:  Vitals:   05/06/19 0823  TempSrc:   PainSc: 2                  Johnna Acosta

## 2019-05-06 NOTE — Progress Notes (Signed)
Pt po day 0 of right knee surgery. Coumadin to be given tonight. Dr. Roland Rack contacted to check if it is ok to give the pt coumadin. Ok to give coumadin tonight.

## 2019-05-06 NOTE — Progress Notes (Addendum)
CCMD called. 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 Dan Europe made aware. No new orders received at this time.

## 2019-05-06 NOTE — TOC Initial Note (Signed)
Transition of Care [facility]) - Initial/Assessment Note    Patient Details  Name: Carl Yang MRN: [id number] Date of Birth: [birth year]  Transition of Care [facility]) CM/SW Contact:    Barrie Dunker, RN Phone Number: 05/06/2019, 11:54 AM  Clinical Narrative:                 Met with the patient to discuss needs, He has already made arrangements to go to [facility], he resides at the [facility], He will need a few weeks of IV ABX, I spoke to Elkins at [facility] and she verified that he will have a room waiting,  He will need EMS transport, I will let Selena Batten know what IV ABX is needed once it is determined  Expected Discharge Plan: Skilled Nursing Facility Barriers to Discharge: Continued Medical Work up   Patient Goals and CMS Choice Patient states their goals for this hospitalization and ongoing recovery are:: Go to [facility]      Expected Discharge Plan and Services Expected Discharge Plan: Skilled Nursing Facility   Discharge Planning Services: CM Consult   Living arrangements for the past 2 months: Independent Living Facility                 DME Arranged: N/A         HH Arranged: NA          Prior Living Arrangements/Services Living arrangements for the past 2 months: Independent Living Facility Lives with:: Self Patient language and need for interpreter reviewed:: No Do you feel safe going back to the place where you live?: Yes      Need for Family Participation in Patient Care: No (Comment) Care giver support system in place?: Yes (comment)   Criminal Activity/Legal Involvement Pertinent to Current Situation/Hospitalization: No - Comment as needed  Activities of Daily Living Home Assistive Devices/Equipment: CPAP ADL Screening (condition at time of admission) Patient's cognitive ability adequate to safely complete daily activities?: Yes Is the patient deaf or have difficulty hearing?: No Does the  patient have difficulty seeing, even when wearing glasses/contacts?: No Does the patient have difficulty concentrating, remembering, or making decisions?: No Patient able to express need for assistance with ADLs?: Yes Does the patient have difficulty dressing or bathing?: No Independently performs ADLs?: Yes (appropriate for developmental age) Does the patient have difficulty walking or climbing stairs?: Yes Weakness of Legs: Right Weakness of Arms/Hands: None  Permission Sought/Granted   Permission granted to share information with : Yes, Verbal Permission Granted              Emotional Assessment Appearance:: Appears stated age Attitude/Demeanor/Rapport: Engaged Affect (typically observed): Calm, Appropriate Orientation: : Oriented to Self, Oriented to Place, Oriented to  Time, Oriented to Situation Alcohol / Substance Use: Not Applicable Psych Involvement: No (comment)  Admission diagnosis:  STATUS POST TOTAL RIGHT KNEE REPLACEMENT USING CEMENT. Patient Active Problem List   Diagnosis Date Noted  . Status post revision of total knee replacement, right 05/05/2019  . 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 Hypertension associated with type 1 diabetes mellitus (HCC) 07/28/2018  . OSA on CPAP 07/28/2018  . GERD without esophagitis 07/28/2018  . Chronic non-seasonal allergic rhinitis 07/28/2018  . Chronic constipation 07/28/2018  . Primary osteoarthritis of right knee 07/28/2018  . 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 Status post total knee replacement using cement, right 07/15/2018   PCP:  Marisue Ivan, MD Pharmacy:   [id number] [place], [place] - [address] AT [facility] [address] [place] [address] Phone: [phone]  Fax: [phone]  [facility] - [address] [place], [place] - [address] [place] [address] Phone: [phone] Fax: [phone]     Social Determinants of Health (SDOH) Interventions    Readmission Risk Interventions No flowsheet data found.

## 2019-05-06 NOTE — TOC Benefit Eligibility Note (Signed)
Transition of Care [facility]) Benefit Eligibility Note    Patient Details  Name: Carl Yang MRN: [id number] Date of Birth: April 28, 1941   Medication/Dose: Enoxaparin 40mg  once daily for 14 days  Covered?: Yes  Prescription Coverage Preferred Pharmacy: [facility] with Person/Company/Phone Number:: Margreta Journey with [employer] at [phone]  Co-Pay: $60.00 esimated copay  Prior Approval: No(No deductible on plan.)   Dannette Barbara Phone Number: [phone] or [phone] 05/06/2019, 9:41 AM

## 2019-05-07 ENCOUNTER — Encounter
Admission: RE | Admit: 2019-05-07 | Discharge: 2019-05-07 | Disposition: A | Discharge status: Home / Self Care | Payer: Medicare Other | Source: Ambulatory Visit | Attending: Internal Medicine | Admitting: Internal Medicine

## 2019-05-07 DIAGNOSIS — M009 Pyogenic arthritis, unspecified: Secondary | ICD-10-CM

## 2019-05-07 LAB — GLUCOSE, CAPILLARY
Glucose-Capillary: 153 mg/dL — ABNORMAL HIGH (ref 70–99)
Glucose-Capillary: 132 mg/dL — ABNORMAL HIGH (ref 70–99)
Glucose-Capillary: 159 mg/dL — ABNORMAL HIGH (ref 70–99)
Glucose-Capillary: 106 mg/dL — ABNORMAL HIGH (ref 70–99)
Glucose-Capillary: 60 mg/dL — ABNORMAL LOW (ref 70–99)
Glucose-Capillary: 96 mg/dL (ref 70–99)

## 2019-05-07 LAB — BASIC METABOLIC PANEL
Anion gap: 6 (ref 5–15)
BUN: 29 mg/dL — ABNORMAL HIGH (ref 8–23)
CO2: 21 mmol/L — ABNORMAL LOW (ref 22–32)
Calcium: 8.6 mg/dL — ABNORMAL LOW (ref 8.9–10.3)
Chloride: 108 mmol/L (ref 98–111)
Creatinine, Ser: 1.35 mg/dL — ABNORMAL HIGH (ref 0.61–1.24)
GFR calc Af Amer: 59 mL/min — ABNORMAL LOW (ref >60)
GFR calc non Af Amer: 51 mL/min — ABNORMAL LOW (ref >60)
Glucose, Bld: 168 mg/dL — ABNORMAL HIGH (ref 70–99)
Potassium: 4.7 mmol/L (ref 3.5–5.1)
Sodium: 135 mmol/L (ref 135–145)

## 2019-05-07 LAB — CBC
HCT: 35.6 % — ABNORMAL LOW (ref 39.0–52.0)
Hemoglobin: 11.1 g/dL — ABNORMAL LOW (ref 13.0–17.0)
MCH: 28.4 pg (ref 26.0–34.0)
MCHC: 31.2 g/dL (ref 30.0–36.0)
MCV: 91 fL (ref 80.0–100.0)
Platelets: 196 10*3/uL (ref 150–400)
RBC: 3.91 MIL/uL — ABNORMAL LOW (ref 4.22–5.81)
RDW: 15.1 % (ref 11.5–15.5)
WBC: 8.9 10*3/uL (ref 4.0–10.5)
nRBC: 0 % (ref 0.0–0.2)

## 2019-05-07 LAB — SURGICAL PATHOLOGY

## 2019-05-07 LAB — PROTIME-INR
INR: 1.6 — ABNORMAL HIGH (ref 0.8–1.2)
Prothrombin Time: 19.1 seconds — ABNORMAL HIGH (ref 11.4–15.2)

## 2019-05-07 LAB — VANCOMYCIN, TROUGH: Vancomycin Tr: 24 ug/mL (ref 15–20)

## 2019-05-07 LAB — ACID FAST SMEAR (AFB, MYCOBACTERIA): Acid Fast Smear: NEGATIVE

## 2019-05-07 LAB — VANCOMYCIN, PEAK: Vancomycin Pk: 31 ug/mL (ref 30–40)

## 2019-05-07 MED ORDER — WARFARIN SODIUM 4 MG PO TABS: 4.0000 mg | ORAL_TABLET | Freq: Once | ORAL | Status: DC

## 2019-05-07 MED ORDER — WARFARIN SODIUM 5 MG PO TABS
5.0000 mg | ORAL_TABLET | Freq: Once | ORAL | Status: AC
  Administered 2019-05-07: 5 mg via ORAL
  Filled 2019-05-07: qty 1

## 2019-05-07 MED ORDER — VANCOMYCIN HCL IN DEXTROSE 1-5 GM/200ML-% IV SOLN
1000.0000 mg | Freq: Two times a day (BID) | INTRAVENOUS | Status: DC
  Administered 2019-05-08: 06:00:00 1000 mg via INTRAVENOUS
  Filled 2019-05-07 (×2): qty 200

## 2019-05-07 NOTE — Progress Notes (Signed)
ID No new complaints Participated with PT Pain controlled No diarrhea No fever  Patient Vitals for the past 24 hrs:  BP Temp Temp src Pulse Resp SpO2  05/07/19 1659 (!) 148/61 98.2 F (36.8 C) Oral 81 18 100 %  05/07/19 0756 (!) 143/79 98 F (36.7 C) - 79 18 97 %  05/06/19 2253 121/68 98.1 F (36.7 C) Oral 79 19 98 %  On examination Awake and alert    CBC Latest Ref Rng & Units 05/07/2019 05/06/2019 05/05/2019  WBC 4.0 - 10.5 K/uL 8.9 12.4(H) 10.6(H)  Hemoglobin 13.0 - 17.0 g/dL 11.1(L) 12.2(L) 13.0  Hematocrit 39.0 - 52.0 % 35.6(L) 38.1(L) 40.7  Platelets 150 - 400 K/uL 196 213 216    CMP Latest Ref Rng & Units 05/07/2019 05/06/2019 05/05/2019  Glucose 70 - 99 mg/dL 168(H) 163(H) 189(H)  BUN 8 - 23 mg/dL 29(H) 20 18  Creatinine 0.61 - 1.24 mg/dL 1.35(H) 0.94 1.00  Sodium 135 - 145 mmol/L 135 137 136  Potassium 3.5 - 5.1 mmol/L 4.7 4.7 4.3  Chloride 98 - 111 mmol/L 108 108 109  CO2 22 - 32 mmol/L 21(L) 23 21(L)  Calcium 8.9 - 10.3 mg/dL 8.6(L) 8.7(L) 8.4(L)    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 Status post 1 stage revision with removal of hardware and placement of new arthroplasty. Prior to culture from the synovial fluid done as outpatient without any antibiotics was negative.  But the cell count was significant for neutrophils. On vanco and ceftriaxone- so far culture negative  also sent fungal and AFB culture from the tissue sent from the OR.Marland Kitchen He will a minimum of 6 weeks of IV antibiotics and  oral antibiotics following that   Increasing creatinine.  His baseline creatinine has been above 1.  Monitor Vanco levels closely   Diabetes mellitus on insulin as well as on metformin.  Has an insulin pump.  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  On Coumadin..    A. fib on Coumadin   Discussed the management with the patient.

## 2019-05-07 NOTE — Progress Notes (Signed)
Physical Therapy Treatment Patient Details Name: Carl Yang MRN: [id number] DOB: [birth year] Today's Date: 05/07/2019    History of Present Illness Pt is a 78 yo male s/p R TKA revision secondary to infection.  PMH includes: HTN, MI, cardiac stents, DM, peripheral neuropathy, L toe amputations, and OSA.  MD assessment 05/06/19 includes: Arrhythmia which is consistent with PVC: Patient is now in normal sinus rhythm, septic R total knee, DM, h/o DVT, and OSA with CPAP at night.    PT Comments    Ready to walk this pm.  Stood with supervision to walker then was able to ambulate 160' with walker and min guard.  No bleeding noted through dressing this session.  Returned to bed after session and repositioned to comfort.   Follow Up Recommendations  Home health PT     Equipment Recommendations  None recommended by PT    Recommendations for Other Services       Precautions / Restrictions Precautions Precautions: Fall Precaution Comments: Watch HR Restrictions Weight Bearing Restrictions: Yes RLE Weight Bearing: Weight bearing as tolerated    Mobility  Bed Mobility Overal bed mobility: Independent                Transfers Overall transfer level: Needs assistance Equipment used: Rolling walker (2 wheeled) Transfers: Sit to/from Stand Sit to Stand: Supervision         General transfer comment: Min verbal cues for sequencing with good control  Ambulation/Gait Ambulation/Gait assistance: Min guard Gait Distance (Feet): 160 Feet Assistive device: Rolling walker (2 wheeled) Gait Pattern/deviations: Decreased step length - left;Decreased stance time - right;Antalgic;Step-through pattern Gait velocity: decreased   General Gait Details: generally steady but used RW from home - he had placed tennis ball on walker but cut out the bottom of the balls causing it to be uneven.  Eductation provided and balls removed with his permission.   He was asked to have therpay staff  at [facility] assist with tennis balls for walker safety.   Stairs             Wheelchair Mobility    Modified Rankin (Stroke Patients Only)       Balance Overall balance assessment: Needs assistance   Sitting balance-Leahy Scale: Normal     Standing balance support: Bilateral upper extremity supported Standing balance-Leahy Scale: Good Standing balance comment: Mod lean on the RW in standing but steady without LOB                            Cognition Arousal/Alertness: Awake/alert Behavior During Therapy: WFL for tasks assessed/performed Overall Cognitive Status: Within Functional Limits for tasks assessed                                        Exercises Total Joint Exercises Ankle Circles/Pumps: AROM;Both;10 reps;15 reps Quad Sets: AROM;Strengthening;Right;10 reps;15 reps Gluteal Sets: Strengthening;Both;10 reps;15 reps Long Arc Quad: AROM;Strengthening;Right;10 reps;15 reps Knee Flexion: AROM;Strengthening;Right;10 reps;15 reps Goniometric ROM: R knee AROM: 5-105 deg Marching in Standing: AROM;Both;Standing;10 reps;Seated Other Exercises Other Exercises: HEP education/review per handout provided Other Exercises: Positioning education for R knee ext PROM Other Exercises: Pt review on proper sequencing during 90 deg turns to the R to prevent CKC twisting on the R knee    General Comments        Pertinent Vitals/Pain Pain Assessment: 0-10 Pain  Score: 3  Pain Location: R knee Pain Intervention(s): Limited activity within patient's tolerance;Monitored during session;Ice applied    Home Living                      Prior Function            PT Goals (current goals can now be found in the care plan section) Progress towards PT goals: Progressing toward goals    Frequency    BID      PT Plan Current plan remains appropriate    Co-evaluation              AM-PAC PT "6 Clicks" Mobility   Outcome Measure   Help needed turning from your back to your side while in a flat bed without using bedrails?: None Help needed moving from lying on your back to sitting on the side of a flat bed without using bedrails?: None Help needed moving to and from a bed to a chair (including a wheelchair)?: A Little Help needed standing up from a chair using your arms (e.g., wheelchair or bedside chair)?: A Little Help needed to walk in hospital room?: A Little Help needed climbing 3-5 steps with a railing? : A Little 6 Click Score: 20    End of Session Equipment Utilized During Treatment: Gait belt Activity Tolerance: Other (comment)(Amb limited this session by bleeding through R knee bandage) Patient left: in bed;with call bell/phone within reach;with bed alarm set Nurse Communication: Mobility status PT Visit Diagnosis: Other abnormalities of gait and mobility (R26.89);Muscle weakness (generalized) (M62.81)     Time: 1350-1405 PT Time Calculation (min) (ACUTE ONLY): 15 min  Charges:  $Gait Training: 8-22 mins $Therapeutic Exercise: 8-22 mins $Therapeutic Activity: 8-22 mins                    Danielle Dess, PTA 05/07/19, 3:21 PM

## 2019-05-07 NOTE — Plan of Care (Signed)

## 2019-05-07 NOTE — Progress Notes (Signed)
Physical Therapy Treatment Patient Details Name: Carl Yang MRN: [id number] DOB: 03/14/1941 Today's Date: 05/07/2019    History of Present Illness Pt is a 78 yo male s/p R TKA revision secondary to infection.  PMH includes: HTN, MI, cardiac stents, DM, peripheral neuropathy, L toe amputations, and OSA.  MD assessment 05/06/19 includes: Arrhythmia which is consistent with PVC: Patient is now in normal sinus rhythm, septic R total knee, DM, h/o DVT, and OSA with CPAP at night.    PT Comments    Pt presents with deficits in strength, transfers, R knee ROM, gait, balance, and activity tolerance but overall is progressing well functionally.  Pt was SBA with sit to/from stand transfers with good control and carryover with sequencing.  Pt's R knee AROM 5-105 deg this session with pt education provided on importance of positioning and quad set HEP to encourage R knee ext ROM.  Upon initiating amb the pt was noted to be bleeding through his R knee bandage and was returned to sitting with nursing in the room at the end of the session to assess the pt.  Pt will benefit from HHPT services upon discharge to safely address above deficits for decreased caregiver assistance and eventual return to PLOF.     Follow Up Recommendations  Home health PT     Equipment Recommendations  None recommended by PT    Recommendations for Other Services       Precautions / Restrictions Precautions Precautions: Fall Precaution Comments: Watch HR Restrictions Weight Bearing Restrictions: Yes RLE Weight Bearing: Weight bearing as tolerated    Mobility  Bed Mobility               General bed mobility comments: NT, pt in recliner  Transfers Overall transfer level: Needs assistance Equipment used: Rolling walker (2 wheeled) Transfers: Sit to/from Stand Sit to Stand: Supervision         General transfer comment: Min verbal cues for sequencing with good control  Ambulation/Gait Ambulation/Gait  assistance: Min guard Gait Distance (Feet): 5 Feet Assistive device: Rolling walker (2 wheeled) Gait Pattern/deviations: Decreased step length - left;Decreased stance time - right;Antalgic;Step-through pattern Gait velocity: decreased   General Gait Details: Upon initiating amb pt noted to be bleeding through R knee bandage, pt returned to sitting and nursing entered room to assess pt.  Pt steady with limited amb with HR WNL.   Stairs             Wheelchair Mobility    Modified Rankin (Stroke Patients Only)       Balance Overall balance assessment: Needs assistance   Sitting balance-Leahy Scale: Normal     Standing balance support: Bilateral upper extremity supported Standing balance-Leahy Scale: Good                              Cognition Arousal/Alertness: Awake/alert Behavior During Therapy: WFL for tasks assessed/performed Overall Cognitive Status: Within Functional Limits for tasks assessed                                        Exercises Total Joint Exercises Ankle Circles/Pumps: AROM;Both;10 reps;15 reps Quad Sets: AROM;Strengthening;Right;10 reps;15 reps Gluteal Sets: Strengthening;Both;10 reps;15 reps Long Arc Quad: AROM;Strengthening;Right;10 reps;15 reps Knee Flexion: AROM;Strengthening;Right;10 reps;15 reps Goniometric ROM: R knee AROM: 5-105 deg Marching in Standing: AROM;Both;Standing;10 reps;Seated Other Exercises Other Exercises:  HEP education/review per handout provided Other Exercises: Positioning education for R knee ext PROM Other Exercises: Pt review on proper sequencing during 90 deg turns to the R to prevent CKC twisting on the R knee    General Comments        Pertinent Vitals/Pain Pain Assessment: 0-10 Pain Score: 3  Pain Location: R knee Pain Descriptors / Indicators: Sore Pain Intervention(s): Premedicated before session;Monitored during session    Home Living                      Prior  Function            PT Goals (current goals can now be found in the care plan section) Progress towards PT goals: Progressing toward goals    Frequency    BID      PT Plan Current plan remains appropriate    Co-evaluation              AM-PAC PT "6 Clicks" Mobility   Outcome Measure  Help needed turning from your back to your side while in a flat bed without using bedrails?: None Help needed moving from lying on your back to sitting on the side of a flat bed without using bedrails?: None Help needed moving to and from a bed to a chair (including a wheelchair)?: A Little Help needed standing up from a chair using your arms (e.g., wheelchair or bedside chair)?: A Little Help needed to walk in hospital room?: A Little Help needed climbing 3-5 steps with a railing? : A Little 6 Click Score: 20    End of Session Equipment Utilized During Treatment: Gait belt Activity Tolerance: Other (comment)(Amb limited this session by bleeding through R knee bandage) Patient left: in chair;with call bell/phone within reach;with chair alarm set;with nursing/sitter in room(Nursing in room at end of session assessing pt's R knee) Nurse Communication: Mobility status PT Visit Diagnosis: Other abnormalities of gait and mobility (R26.89);Muscle weakness (generalized) (M62.81)     Time: 1914-7829 PT Time Calculation (min) (ACUTE ONLY): 24 min  Charges:  $Therapeutic Exercise: 8-22 mins $Therapeutic Activity: 8-22 mins                     D. Scott Trayonna Bachmeier PT, DPT 05/07/19, 11:33 AM

## 2019-05-07 NOTE — Consult Note (Addendum)
[facility] for Warfarin Indication: atrial fibrillation  No Known Allergies  Patient Measurements: Height: 6\' 3"  (190.5 cm) Weight: 222 lb 10.6 oz (101 kg) IBW/kg (Calculated) : 84.5 Heparin Dosing Weight:   Vital Signs: Temp: 98 F (36.7 C) (08/20 0756) Temp Source: Oral (08/19 2253) BP: 143/79 (08/20 0756) Pulse Rate: 79 (08/20 0756)  Labs: Recent Labs    05/05/19 0901 05/05/19 1851  05/05/19 1852 05/05/19 2017 05/06/19 0443 05/07/19 0445  HGB  --   --    < > 13.0  --  12.2* 11.1*  HCT  --   --   --  40.7  --  38.1* 35.6*  PLT  --   --   --  216  --  213 196  LABPROT 15.5*  --   --   --   --  14.7 19.1*  INR 1.2  --   --   --   --  1.2 1.6*  CREATININE  --  1.00  --   --   --  0.94 1.35*  TROPONINIHS  --  14  --   --  14  --   --    < > = values in this interval not displayed.    Estimated Creatinine Clearance: 55.6 mL/min (A) (by C-G formula based on SCr of 1.35 mg/dL (H)).   Medications:  Warfarin 3 mg ever Mon, Tues, Wed, Thurs, Fri - last dose 8/13 in preparation for knee surgery  Warfarin 4 mg Sat and Sun- last dose 8/9 in preparation for knee surgery  Enoxaparin 40 mg Q24H- scheduled to start tomorrow morning.   Assessment: Patient had TKA today. Discussed with Dr. Roland Rack about starting warfarin tonight and he confirmed to resume warfarin for this evening dose. Will follow up on baseline CBC.   8/18: INR 1.2 8/19: INR 1.2 8/20: INR 1.6  Goal of Therapy:  INR 2-3 Monitor platelets by anticoagulation protocol: Yes   Plan: INR of 1.6 is still subtherapeutic. Hgb and platelets remain stable. Will reorder 5mg  warfarin dose tonight. Patient receiving bridge therapy of 40mg  LMWH daily currently while on warfarin until INR is within desired range. Will recheck INR and CBC with AM labs, and order CBC at least once every 3 days per protocol.    Goro Wenrick A Winnie Umali 05/07/2019,9:01 AM

## 2019-05-07 NOTE — Discharge Summary (Signed)
Physician Discharge Summary  Patient ID: Carl Yang MRN: [id number] DOB/AGE: 05/28/1941 78 y.o.  Admit date: 05/05/2019 Discharge date: 05/08/2019  Admission Diagnoses:  STATUS POST TOTAL RIGHT KNEE REPLACEMENT USING CEMENT.  Discharge Diagnoses: Patient Active Problem List   Diagnosis Date Noted  . Status post revision of total knee replacement, right 05/05/2019  . 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 Hypertension associated with type 1 diabetes mellitus (HCC) 07/28/2018  . OSA on CPAP 07/28/2018  . GERD without esophagitis 07/28/2018  . Chronic non-seasonal allergic rhinitis 07/28/2018  . Chronic constipation 07/28/2018  . Primary osteoarthritis of right knee 07/28/2018  . 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 Status post total knee replacement using cement, right 07/15/2018    Past Medical History:  Diagnosis Date  . Arthritis   . Cancer (HCC)    Melanoma  . Complication of anesthesia    After surgery at [facility], the patient coded during the night.  . Coronary artery disease    Stents  . Diabetes mellitus without complication (HCC)   . DVT (deep venous thrombosis) (HCC)    Right lower limb  . Dyspnea   . GERD (gastroesophageal reflux disease)   . Myocardial infarction (HCC)    1998, 2003  . Neuromuscular disorder (HCC)    Peripheral Neuropathy r/t DM  . Peripheral vascular disease (HCC)    Diabetes  . Sleep apnea    Uses CPAP     Transfusion: None.   Consultants (if any):  Dr. Lynn Ito, Infectious Disease Dr. Auburn Bilberry  Discharged Condition: Improved  Hospital Course: Carl Yang is an 78 y.o. male who was admitted 05/05/2019 with a diagnosis of a septic right total knee arthroplasty  and went to the operating room on 05/05/2019 and  underwent the above named procedures.    Surgeries: Procedure(s): TOTAL KNEE REVISION on 05/05/2019 Patient tolerated the surgery well. Taken to PACU where she was stabilized and then transferred to the orthopedic floor.  Started on Lovenox 40mg  q 24 hrs in addition to Coumadin until PT/INR reached therapeutic levels.  On 05/08/19 PT/INR was 2.0.  WIll plan on PT check tomorrow following discharge.   Foot pumps applied bilaterally at 80 mm. Heels elevated on bed with rolled towels. No evidence of DVT. Negative [place].  Physical therapy started on day #1 for gait training and transfer. OT started day #1 for ADL and assisted devices.  Patient's Foley was removed on POD1.  PICC Line was placed prior to discharge on 05/08/19.  Implants: One-stage revision right TKA using all-cemented Biomet 360 revision knee system with a 75 mm PS femur with a 120 mm stem, a 79 mm tibial tray with a 120 mm stem, a 20 mm PS insert, and a 37 x 10 mm all-poly 3-pegged domed patella.  He was given perioperative antibiotics:  Anti-infectives (From admission, onward)   Start     Dose/Rate Route Frequency Ordered Stop   05/09/19 1000  vancomycin (VANCOCIN) 2,000 mg in sodium chloride 0.9 % 500 mL IVPB     2,000 mg 250 mL/hr over 120 Minutes Intravenous Every 24 hours 05/08/19 0900     05/09/19 0000  cefTRIAXone (ROCEPHIN) IVPB     2 g Intravenous Every 24 hours 05/08/19 1200 06/17/19 2359   05/09/19 0000  vancomycin IVPB  2,000 mg Intravenous Every 24 hours 05/08/19 1200 06/17/19 2359   05/08/19 1100  vancomycin (VANCOCIN) IVPB 1000 mg/200 mL premix     1,000 mg 200 mL/hr over 60 Minutes Intravenous  Once 05/08/19 0900 05/08/19 1055   05/08/19 0600  vancomycin (VANCOCIN) IVPB 1000 mg/200 mL premix  Status:  Discontinued     1,000 mg 200 mL/hr over 60 Minutes Intravenous Every 12 hours 05/07/19 1853 05/08/19 0900   05/06/19 1645  cefTRIAXone (ROCEPHIN) 2 g in sodium chloride 0.9 % 100 mL IVPB     2 g 200 mL/hr  over 30 Minutes Intravenous Every 24 hours 05/06/19 1644     05/06/19 0600  vancomycin (VANCOCIN) 1,250 mg in sodium chloride 0.9 % 250 mL IVPB  Status:  Discontinued     1,250 mg 166.7 mL/hr over 90 Minutes Intravenous Every 12 hours 05/05/19 1955 05/07/19 1853   05/05/19 1930  vancomycin (VANCOCIN) IVPB 1000 mg/200 mL premix     1,000 mg 200 mL/hr over 60 Minutes Intravenous  Once 05/05/19 1929 05/06/19 2141   05/05/19 1745  vancomycin (VANCOCIN) IVPB 1000 mg/200 mL premix  Status:  Discontinued    Note to Pharmacy: Please dose vancomycin as appropriate. Continue antibiotic regimen as recommended by Infectious Disease consultation.   1,000 mg 200 mL/hr over 60 Minutes Intravenous Every 12 hours 05/05/19 1739 05/05/19 1919   05/05/19 0600  vancomycin (VANCOCIN) IVPB 1000 mg/200 mL premix     1,000 mg 200 mL/hr over 60 Minutes Intravenous 60 min pre-op 05/04/19 2147 05/05/19 1038   05/04/19 2145  vancomycin (VANCOCIN) 1,000 mg in sodium chloride 0.9 % 250 mL IVPB  Status:  Discontinued     1,000 mg 250 mL/hr over 60 Minutes Intravenous  Once 05/04/19 2133 05/04/19 2144    .  He was given sequential compression devices, early ambulation, and Lovenox and Coumadin for DVT prophylaxis.  He benefited maximally from the hospital stay and there were no complications.    Recent vital signs:  Vitals:   05/07/19 2321 05/08/19 0802  BP: 129/64 131/75  Pulse: 74 78  Resp: 19 17  Temp: 98.2 F (36.8 C) 98 F (36.7 C)  SpO2: 97% 97%    Recent laboratory studies:  Lab Results  Component Value Date   HGB 10.8 (L) 05/08/2019   HGB 11.1 (L) 05/07/2019   HGB 12.2 (L) 05/06/2019   Lab Results  Component Value Date   WBC 6.9 05/08/2019   PLT 185 05/08/2019   Lab Results  Component Value Date   INR 2.0 (H) 05/08/2019   Lab Results  Component Value Date   NA 136 05/08/2019   K 4.6 05/08/2019   CL 108 05/08/2019   CO2 23 05/08/2019   BUN 24 (H) 05/08/2019   CREATININE 1.23  05/08/2019   GLUCOSE 93 05/08/2019    Discharge Medications:   Allergies as of 05/08/2019   No Known Allergies     Medication List    TAKE these medications   acetaminophen 325 MG tablet Commonly known as: TYLENOL Take 650 mg by mouth every 4 (four) hours as needed.   atorvastatin 80 MG tablet Commonly known as: LIPITOR Take 1 tablet (80 mg total) by mouth daily.   cefTRIAXone  IVPB Commonly known as: ROCEPHIN Inject 2 g into the vein daily. Indication:  PJI of R knee Last Day of Therapy:  06/17/2019 Labs every Monday weekly  while on IV antibiotics: _X_ CBC with differential _X_ CMP _X_ Vancomycin trough  Labs every Thursday (weekly) while on antibiotics BMP Vancomycin trough  Labs every other Monday ESR  CRP ( regular and not the high sensitive CRP) Start taking on: May 09, 2019   CENTRUM SILVER 50+MEN PO Take 1 tablet by mouth daily.   docusate sodium 100 MG capsule Commonly known as: COLACE Take 100 mg by mouth daily.   esomeprazole 40 MG capsule Commonly known as: NEXIUM Take 1 capsule (40 mg total) by mouth daily.   fexofenadine 180 MG tablet Commonly known as: ALLEGRA Take 180 mg by mouth daily.   HumaLOG 100 UNIT/ML injection Generic drug: insulin lispro Inject 1.5 mLs (150 Units total) into the skin continuous. Via pump   HYDROcodone-acetaminophen 5-325 MG tablet Commonly known as: Norco Take 1 tablet by mouth every 6 (six) hours as needed for moderate pain.   metFORMIN 500 MG 24 hr tablet Commonly known as: GLUCOPHAGE-XR Take 1 tablet (500 mg total) by mouth 2 (two) times daily.   oxyCODONE 5 MG immediate release tablet Commonly known as: Oxy IR/ROXICODONE Take 1 tablet (5 mg total) by mouth every 4 (four) hours as needed for moderate pain.   ramipril 2.5 MG capsule Commonly known as: ALTACE Take 1 capsule (2.5 mg total) by mouth daily. What changed: when to take this   traMADol 50 MG tablet Commonly known as: ULTRAM Take 1 tablet  (50 mg total) by mouth every 6 (six) hours.   UNABLE TO FIND Inhale 1 continuous puffing into the lungs at bedtime.   vancomycin  IVPB Inject 2,000 mg into the vein daily. Indication:  PJI of R knee Last Day of Therapy:  06/17/2019 Labs every Monday weekly  while on IV antibiotics: _X_ CBC with differential _X_ CMP _X_ Vancomycin trough Labs every Thursday (weekly) while on antibiotics BMP Vancomycin trough  Labs every other Monday ESR  CRP ( regular and not the high sensitive CRP) Start taking on: May 09, 2019   warfarin 3 MG tablet Commonly known as: COUMADIN Take 1 tablet (3 mg total) by mouth daily. Patient takes 3mg  Wed,Sat, Sun What changed:   when to take this  additional instructions   warfarin 4 MG tablet Commonly known as: COUMADIN Take 1 tablet (4 mg total) by mouth daily. Patient takes 4mg  Mon, Tues, Thurs, Fri What changed:   when to take this  additional instructions            Home Infusion Instuctions  (From admission, onward)         Start     Ordered   05/08/19 0000  Home infusion instructions [facility] May follow [facility] Dosing Protocol; May administer Cathflo as needed to maintain patency of vascular access device.; Flushing of vascular access device: per [facility] Protocol: 0.9% NaCl pre/post medica...    Question Answer Comment  Instructions May follow [facility] Dosing Protocol   Instructions May administer Cathflo as needed to maintain patency of vascular access device.   Instructions Flushing of vascular access device: per [facility] Protocol: 0.9% NaCl pre/post medication administration and prn patency; Heparin 100 u/ml, 5ml for implanted ports and Heparin 10u/ml, 5ml for all other central venous catheters.   Instructions May follow [facility] Anaphylaxis Protocol for First Dose Administration in the home: 0.9% NaCl at 25-50 ml/hr to maintain IV access for protocol meds. Epinephrine 0.3 ml IV/IM PRN and Benadryl 25-50 IV/IM PRN s/s of  anaphylaxis.   Instructions [facility] Infusion Coordinator (RN) to assist per patient IV care needs in  the home PRN.      05/08/19 1200           Durable Medical Equipment  (From admission, onward)         Start     Ordered   05/05/19 1740  DME Bedside commode  Once    Question:  Patient needs a bedside commode to treat with the following condition  Answer:  Status post revision of total knee replacement, right   05/05/19 1739   05/05/19 1740  DME 3 n 1  Once     05/05/19 1739   05/05/19 1740  DME Walker rolling  Once    Question:  Patient needs a walker to treat with the following condition  Answer:  Status post revision of total knee replacement, right   05/05/19 1739          Diagnostic Studies: Dg Chest Port 1 View  Result Date: 05/05/2019 CLINICAL DATA:  Dyspnea. EXAM: PORTABLE CHEST 1 VIEW COMPARISON:  July 02, 2018 FINDINGS: Cardiomediastinal silhouette is normal. Mediastinal contours appear intact. Calcific atherosclerotic disease of the aorta. Hiatal hernia. There is no evidence of focal airspace consolidation, pleural effusion or pneumothorax. Osseous structures are without acute abnormality. Soft tissues are grossly normal. IMPRESSION: 1. No active disease. 2. Hiatal hernia. Electronically Signed   By: Ted Mcalpine M.D.   On: 05/05/2019 20:23   Dg Knee Right Port  Result Date: 05/05/2019 CLINICAL DATA:  Status post total right knee arthroplasty revision. EXAM: PORTABLE RIGHT KNEE - 1-2 VIEW COMPARISON:  July 15, 2018 FINDINGS: Post total right knee arthroplasty with normal alignment of the prosthetic components. No evidence of fracture. Expected soft tissue edema and emphysema. Skin staples noted. IMPRESSION: Post total right knee arthroplasty without evidence of immediate complications. Electronically Signed   By: Ted Mcalpine M.D.   On: 05/05/2019 16:26    Disposition:    Please check PT/INR on 05/09/19.  Discharge Instructions     Home infusion instructions [facility] May follow [facility] Dosing Protocol; May administer Cathflo as needed to maintain patency of vascular access device.; Flushing of vascular access device: per [facility] Protocol: 0.9% NaCl pre/post medica...   Complete by: As directed    Instructions: May follow [facility] Dosing Protocol   Instructions: May administer Cathflo as needed to maintain patency of vascular access device.   Instructions: Flushing of vascular access device: per [facility] Protocol: 0.9% NaCl pre/post medication administration and prn patency; Heparin 100 u/ml, 5ml for implanted ports and Heparin 10u/ml, 5ml for all other central venous catheters.   Instructions: May follow [facility] Anaphylaxis Protocol for First Dose Administration in the home: 0.9% NaCl at 25-50 ml/hr to maintain IV access for protocol meds. Epinephrine 0.3 ml IV/IM PRN and Benadryl 25-50 IV/IM PRN s/s of anaphylaxis.   Instructions: [facility] Infusion Coordinator (RN) to assist per patient IV care needs in the home PRN.      Follow-up Information    Anson [place], PA-C Follow up in 14 day(s).   Specialty: Physician Assistant Why: Mindi Slicker information: [address] [place] [address] [phone]          Signed: Meriel Pica PA-C 05/08/2019, 12:04 PM

## 2019-05-07 NOTE — Progress Notes (Signed)
Pt has home cpap unit but he states that he will probably not be able to use tonight but will call if needed.

## 2019-05-07 NOTE — Consult Note (Signed)
Pharmacy Antibiotic Note  Carl Yang is a 78 y.o. male admitted on 05/05/2019 with wound infection.  Pharmacy has been consulted for Vancomycin dosing.  Patient s/p one-stage TKA for PJI  Today, 05/07/2019  - SCr bumped to 1.35 this am.  Also on ACE-i - vanco peak = 31 mcg/mL  At 09:12 (1250mg  IV q12h - dose at 06:32) - vanco trough ordered for this evening - cultures negative   Plan: - Continue Vancomycin 1250 mg Q12H @ 0600 on 8/19. AUC goal 400-550 but since discharging home and home health uses goal trough, will need to dose per trough = 15 - 20 mcg/ml.   - Scr ordered for am - ceftriaxone 2gm IV q24h  Height: 6\' 3"  (190.5 cm) Weight: 222 lb 10.6 oz (101 kg) IBW/kg (Calculated) : 84.5  Temp (24hrs), Avg:98.1 F (36.7 C), Min:98 F (36.7 C), Max:98.1 F (36.7 C)  Recent Labs  Lab 05/05/19 1851 05/05/19 1852 05/06/19 0443 05/07/19 0445 05/07/19 0912  WBC  --  10.6* 12.4* 8.9  --   CREATININE 1.00  --  0.94 1.35*  --   VANCOPEAK  --   --   --   --  31    Estimated Creatinine Clearance: 55.6 mL/min (A) (by C-G formula based on SCr of 1.35 mg/dL (H)).    No Known Allergies  Antimicrobials this admission: 8/18 Vancomcyin  8/19 ceftriaxone  Dose adjustments this admission: N/A  Microbiology results: 8/18 Wound Cx: NG  Thank you for allowing pharmacy to be a part of this patient's care.  Doreene Eland, PharmD, BCPS.   Work Cell: [phone] 05/07/2019 2:00 PM

## 2019-05-07 NOTE — TOC Progression Note (Signed)
Transition of Care [facility]) - Progression Note    Patient Details  Name: Carl Yang MRN: [id number] Date of Birth: April 06, 1941  Transition of Care [facility]) CM/SW [place], RN Phone Number: 05/07/2019, 11:34 AM  Clinical Narrative:    Awaiting results to see which IV ABX patient will need.  He agrees to go to SNF tomorrow via EMS transport to be arranged at [place]   Expected Discharge Plan: [place] Barriers to Discharge: Continued Medical Work up  Expected Discharge Plan and Services Expected Discharge Plan: [place]   Discharge Planning Services: CM Consult   Living arrangements for the past 2 months: [place]                 DME Arranged: N/A         HH Arranged: NA           Social Determinants of Health (SDOH) Interventions    Readmission Risk Interventions No flowsheet data found.

## 2019-05-07 NOTE — Progress Notes (Signed)
Subjective: 2 Days Post-Op Procedure(s) (LRB): TOTAL KNEE REVISION (Right) Patient reports pain as mild.   Patient is well, did have episode of hypoglycemia last night. Current plan is for discharge to [place] [facility] upon discharge. ID has been consulted for the patient as well.  Plan for IV antibiotics following discharge. Negative for chest pain and shortness of breath Fever: no Gastrointestinal:Negative for nausea and vomiting  Objective: Vital signs in last 24 hours: Temp:  [97.8 F (36.6 C)-98.1 F (36.7 C)] 98.1 F (36.7 C) (08/19 2253) Pulse Rate:  [49-79] 79 (08/19 2253) Resp:  [16-19] 19 (08/19 2253) BP: (121-126)/(68-76) 121/68 (08/19 2253) SpO2:  [98 %-100 %] 98 % (08/19 2253)  Intake/Output from previous day:  Intake/Output Summary (Last 24 hours) at 05/07/2019 0730 Last data filed at 05/07/2019 6712 Gross per 24 hour  Intake 1713.95 ml  Output 350 ml  Net 1363.95 ml    Intake/Output this shift: No intake/output data recorded.  Labs: Recent Labs    05/05/19 1852 05/06/19 0443 05/07/19 0445  HGB 13.0 12.2* 11.1*   Recent Labs    05/06/19 0443 05/07/19 0445  WBC 12.4* 8.9  RBC 4.24 3.91*  HCT 38.1* 35.6*  PLT 213 196   Recent Labs    05/06/19 0443 05/07/19 0445  NA 137 135  K 4.7 4.7  CL 108 108  CO2 23 21*  BUN 20 29*  CREATININE 0.94 1.35*  GLUCOSE 163* 168*  CALCIUM 8.7* 8.6*   Recent Labs    05/06/19 0443 05/07/19 0445  INR 1.2 1.6*     EXAM General - Patient is Alert, Appropriate and Oriented Extremity - ABD soft Sensation intact distally Intact pulses distally Dorsiflexion/Plantar flexion intact Incision: moderate drainage No cellulitis present Dressing/Incision - Bloody drainage noted to the right knee this AM. Motor Function - intact, moving foot and toes well on exam.   Past Medical History:  Diagnosis Date  . Arthritis   . Cancer ([place])    Melanoma  . Complication of anesthesia    After surgery at [facility], the  patient coded during the night.  . Coronary artery disease    Stents  . Diabetes mellitus without complication ([place])   . DVT (deep venous thrombosis) (HCC)    Right lower limb  . Dyspnea   . GERD (gastroesophageal reflux disease)   . Myocardial infarction ([place])    1998, 2003  . Neuromuscular disorder ([place])    Peripheral Neuropathy r/t DM  . Peripheral vascular disease ([place])    Diabetes  . Sleep apnea    Uses CPAP    Assessment/Plan: 2 Days Post-Op Procedure(s) (LRB): TOTAL KNEE REVISION (Right) Active Problems:   Status post revision of total knee replacement, right  Estimated body mass index is 27.83 kg/m as calculated from the following:   Height as of this encounter: 6\' 3"  (1.905 m).   Weight as of this encounter: 101 kg. Advance diet Up with therapy   Labs reviewed this AM.  Hg 11.1 this AM.  WBC 8.9  CBC and BMP tomorrow morning. Glucose 168 this morning, did have episode of hypoglycemia of 38 last night due to insulin bolus. ID has been consulted.  Cultures demonstrate no growth at this time. Plan for PICC line placement today. Continue with PT.   Begin working on having a BM. Continue Lovenox until Warfarin is at therapeutic dose.  PT this morning at 1.6  DVT Prophylaxis - Lovenox, Coumadin, Foot Pumps and TED hose Weight-Bearing as tolerated  to right leg  J. Cameron Proud, PA-C [facility] 05/07/2019, 7:30 AM

## 2019-05-07 NOTE — Consult Note (Addendum)
Pharmacy Antibiotic Note  Carl Yang is a 78 y.o. male admitted on 05/05/2019 with wound infection.  Pharmacy has been consulted for Vancomycin dosing.  Patient s/p one-stage TKA for PJI  Today, 05/07/2019  - SCr bumped to 1.35 this am.  Also on ACE-i - vanco peak = 31 mcg/mL  At 09:12 (1250mg  IV q12h - dose at 06:32) - Vanco trough @1720 : 24 - above goal trough of 15-20 mcg/ml. - cultures negative   Plan: -Vancomycin 1000 mg Q12H - will start @ 0600 on 8/21. Will check trough before third dose.  AUC goal 400-550 but since discharging home and home health uses goal trough, will need to dose per trough = 15 - 20 mcg/ml.     - ceftriaxone 2gm IV q24h  Height: 6\' 3"  (190.5 cm) Weight: 222 lb 10.6 oz (101 kg) IBW/kg (Calculated) : 84.5  Temp (24hrs), Avg:98.1 F (36.7 C), Min:98 F (36.7 C), Max:98.2 F (36.8 C)  Recent Labs  Lab 05/05/19 1851 05/05/19 1852 05/06/19 0443 05/07/19 0445 05/07/19 0912 05/07/19 1720  WBC  --  10.6* 12.4* 8.9  --   --   CREATININE 1.00  --  0.94 1.35*  --   --   VANCOTROUGH  --   --   --   --   --  24*  VANCOPEAK  --   --   --   --  31  --     Estimated Creatinine Clearance: 55.6 mL/min (A) (by C-G formula based on SCr of 1.35 mg/dL (H)).    No Known Allergies  Antimicrobials this admission: 8/18 Vancomcyin  8/19 ceftriaxone  Dose adjustments this admission: N/A  Microbiology results: 8/18 Wound Cx: NG  Thank you for allowing pharmacy to be a part of this patient's care.  Kristeen Miss, PharmD Clinical Pharmacist 05/07/2019 6:36 PM

## 2019-05-08 ENCOUNTER — Inpatient Hospital Stay: Payer: Self-pay

## 2019-05-08 LAB — CBC
HCT: 34.5 % — ABNORMAL LOW (ref 39.0–52.0)
Hemoglobin: 10.8 g/dL — ABNORMAL LOW (ref 13.0–17.0)
MCH: 28.6 pg (ref 26.0–34.0)
MCHC: 31.3 g/dL (ref 30.0–36.0)
MCV: 91.3 fL (ref 80.0–100.0)
Platelets: 185 10*3/uL (ref 150–400)
RBC: 3.78 MIL/uL — ABNORMAL LOW (ref 4.22–5.81)
RDW: 15.3 % (ref 11.5–15.5)
WBC: 6.9 10*3/uL (ref 4.0–10.5)
nRBC: 0 % (ref 0.0–0.2)

## 2019-05-08 LAB — BASIC METABOLIC PANEL
Anion gap: 5 (ref 5–15)
BUN: 24 mg/dL — ABNORMAL HIGH (ref 8–23)
CO2: 23 mmol/L (ref 22–32)
Calcium: 8.5 mg/dL — ABNORMAL LOW (ref 8.9–10.3)
Chloride: 108 mmol/L (ref 98–111)
Creatinine, Ser: 1.23 mg/dL (ref 0.61–1.24)
GFR calc Af Amer: >60 mL/min (ref >60)
GFR calc non Af Amer: 57 mL/min — ABNORMAL LOW (ref >60)
Glucose, Bld: 93 mg/dL (ref 70–99)
Potassium: 4.6 mmol/L (ref 3.5–5.1)
Sodium: 136 mmol/L (ref 135–145)

## 2019-05-08 LAB — GLUCOSE, CAPILLARY
Glucose-Capillary: 82 mg/dL (ref 70–99)
Glucose-Capillary: 111 mg/dL — ABNORMAL HIGH (ref 70–99)
Glucose-Capillary: 240 mg/dL — ABNORMAL HIGH (ref 70–99)
Glucose-Capillary: 149 mg/dL — ABNORMAL HIGH (ref 70–99)

## 2019-05-08 LAB — PROTIME-INR
INR: 2 — ABNORMAL HIGH (ref 0.8–1.2)
Prothrombin Time: 22.7 seconds — ABNORMAL HIGH (ref 11.4–15.2)

## 2019-05-08 LAB — C-REACTIVE PROTEIN: CRP: 5.5 mg/dL — ABNORMAL HIGH (ref <1.0)

## 2019-05-08 LAB — SEDIMENTATION RATE: Sed Rate: 16 mm/hr (ref 0–20)

## 2019-05-08 MED ORDER — VANCOMYCIN HCL IN DEXTROSE 1-5 GM/200ML-% IV SOLN
1000.0000 mg | Freq: Once | INTRAVENOUS | Status: AC
  Administered 2019-05-08: 10:00:00 1000 mg via INTRAVENOUS
  Filled 2019-05-08: qty 200

## 2019-05-08 MED ORDER — CEFTRIAXONE IV (FOR PTA / DISCHARGE USE ONLY): 2.0000 g | INTRAVENOUS | 0 refills | Status: AC

## 2019-05-08 MED ORDER — WARFARIN SODIUM 4 MG PO TABS
4.0000 mg | ORAL_TABLET | Freq: Once | ORAL | Status: AC
  Administered 2019-05-08: 18:00:00 4 mg via ORAL
  Filled 2019-05-08: qty 1

## 2019-05-08 MED ORDER — OXYCODONE HCL 5 MG PO TABS: 5.0000 mg | ORAL_TABLET | ORAL | 0 refills | Status: DC | PRN

## 2019-05-08 MED ORDER — SODIUM CHLORIDE 0.9% FLUSH: 10.0000 mL | INTRAVENOUS | Status: DC | PRN

## 2019-05-08 MED ORDER — VANCOMYCIN HCL 10 G IV SOLR
2000.0000 mg | INTRAVENOUS | Status: DC
  Filled 2019-05-08: qty 2000

## 2019-05-08 MED ORDER — TRAMADOL HCL 50 MG PO TABS: 50.0000 mg | ORAL_TABLET | Freq: Four times a day (QID) | ORAL | 0 refills | Status: DC

## 2019-05-08 MED ORDER — SODIUM CHLORIDE 0.9% FLUSH: 10.0000 mL | Freq: Two times a day (BID) | INTRAVENOUS | Status: DC

## 2019-05-08 MED ORDER — VANCOMYCIN IV (FOR PTA / DISCHARGE USE ONLY): 2000.0000 mg | INTRAVENOUS | 0 refills | Status: AC

## 2019-05-08 NOTE — TOC Progression Note (Signed)
Transition of Care [facility]) - Progression Note    Patient Details  Name: Carl Yang MRN: [id number] Date of Birth: [birth year]  Transition of Care [facility] CM/SW Contact  Su Hilt, RN Phone Number: 05/08/2019, 11:18 AM  Clinical Narrative:    Damaris Schooner with Maudie Mercury at [facility], she stated the patient wioll go to room 352 and report can be called to [phone]   Expected Discharge Plan: [place] Barriers to Discharge: Continued Medical Work up  Expected Discharge Plan and Services Expected Discharge Plan: [place]   Discharge Planning Services: CM Consult   Living arrangements for the past 2 months: [place]                 DME Arranged: N/A         HH Arranged: NA           Social Determinants of Health (SDOH) Interventions    Readmission Risk Interventions No flowsheet data found.

## 2019-05-08 NOTE — Consult Note (Signed)
Carl Yang for Warfarin Indication: atrial fibrillation  No Known Allergies  Patient Measurements: Height: 6\' 3"  (190.5 cm) Weight: 222 lb 10.6 oz (101 kg) IBW/kg (Calculated) : 84.5 Heparin Dosing Weight:   Vital Signs: Temp: 98 F (36.7 C) (08/21 0802) Temp Source: Oral (08/21 0802) BP: 131/75 (08/21 0802) Pulse Rate: 78 (08/21 0802)  Labs: Recent Labs    05/05/19 1851  05/05/19 2017 05/06/19 0443 05/07/19 0445 05/08/19 0418  HGB  --    < >  --  12.2* 11.1* 10.8*  HCT  --    < >  --  38.1* 35.6* 34.5*  PLT  --    < >  --  213 196 185  LABPROT  --   --   --  14.7 19.1* 22.7*  INR  --   --   --  1.2 1.6* 2.0*  CREATININE 1.00  --   --  0.94 1.35* 1.23  TROPONINIHS 14  --  14  --   --   --    < > = values in this interval not displayed.    Estimated Creatinine Clearance: 61.1 mL/min (by C-G formula based on SCr of 1.23 mg/dL).   Medications:  Warfarin 3 mg ever Mon, Tues, Wed, Thurs, Fri - last dose 8/13 in preparation for knee surgery  Warfarin 4 mg Sat and Sun- last dose 8/9 in preparation for knee surgery  Enoxaparin 40 mg Q24H- scheduled to start tomorrow morning.   Assessment: Patient had TKA today. Discussed with Dr. Roland Rack about starting warfarin tonight and he confirmed to resume warfarin for this evening dose. Will follow up on baseline CBC.   8/18: INR 1.2 8/19: INR 1.2 8/20: INR 1.6 8/21: INR 2.0  Goal of Therapy:  INR 2-3 Monitor platelets by anticoagulation protocol: Yes   Plan: INR of 2.0 is now in therapeutic range. Hgb and platelets remain stable. Will decrease to 4mg  warfarin dose tonight. Patient expected to discharge today, will do 1 more dose of bridge therapy of 40mg  LMWH, depending on INR tomorrow. Will recheck INR and CBC with AM labs, and order CBC at least once every 3 days per protocol.    Carl Yang A Carl Yang 05/08/2019,8:51 AM

## 2019-05-08 NOTE — Progress Notes (Signed)
ID  Patient doing well. Friend at bedside. No fever or chills Pain and swelling right knee less Awaiting placement  Patient Vitals for the past 24 hrs:  BP Temp Temp src Pulse Resp SpO2  05/08/19 1508 137/71 98.1 F (36.7 C) Oral 73 20 96 %  05/08/19 0802 131/75 98 F (36.7 C) Oral 78 17 97 %  05/07/19 2321 129/64 98.2 F (36.8 C) Oral 74 19 97 %  05/07/19 1659 (!) 148/61 98.2 F (36.8 C) Oral 81 18 100 %   On examination: Looks well Chest clear to auscultation Heart sounds S1-S2 Right surgical site covered with dressing. Some edema of the right leg  CBC Latest Ref Rng & Units 05/08/2019 05/07/2019 05/06/2019  WBC 4.0 - 10.5 K/uL 6.9 8.9 12.4(H)  Hemoglobin 13.0 - 17.0 g/dL 10.8(L) 11.1(L) 12.2(L)  Hematocrit 39.0 - 52.0 % 34.5(L) 35.6(L) 38.1(L)  Platelets 150 - 400 K/uL 185 196 213    CMP Latest Ref Rng & Units 05/08/2019 05/07/2019 05/06/2019  Glucose 70 - 99 mg/dL 93 168(H) 163(H)  BUN 8 - 23 mg/dL 24(H) 29(H) 20  Creatinine 0.61 - 1.24 mg/dL 1.23 1.35(H) 0.94  Sodium 135 - 145 mmol/L 136 135 137  Potassium 3.5 - 5.1 mmol/L 4.6 4.7 4.7  Chloride 98 - 111 mmol/L 108 108 108  CO2 22 - 32 mmol/L 23 21(L) 23  Calcium 8.9 - 10.3 mg/dL 8.5(L) 8.6(L) 8.7(L)    Surgical culture no growth  Impression and recommendation  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  On vancomycin and ceftriaxone.  He will be discharged on these 2 medications for 6 weeks.  Vancomycin trough and creatinine will be monitored as outpatient.   Discussed the management with the patient and his friend at bedside and with his daughter on the phone. Also communicated with Dr. Roland Rack will follow him as outpatient.

## 2019-05-08 NOTE — Progress Notes (Signed)
Pt refuses CPAP; will continue to monitor 

## 2019-05-08 NOTE — Progress Notes (Signed)
PHARMACY CONSULT NOTE FOR:  OUTPATIENT  PARENTERAL ANTIBIOTIC THERAPY (OPAT)  Indication: PJI of R knee Regimen: Vancomycin 2gm IV q24h and ceftriaxone 2gm IV q24h End date: 06/17/2019  IV antibiotic discharge orders are pended. To discharging provider:  please sign these orders via discharge navigator,  Select New Orders & click on the button choice - Manage This Unsigned Work.     Thank you for allowing pharmacy to be a part of this patient's care.  Doreene Eland, PharmD, BCPS.   Work Cell: [phone] 05/08/2019 8:58 AM

## 2019-05-08 NOTE — Treatment Plan (Signed)
Diagnosis: Culture negative Prosthetic joint infection- S/p one stage revision arthroplasty  Baseline Creatinine range 1.23-1.35   No Known Allergies  OPAT Orders Discharge antibiotics:  Ceftriaxone 2grams IV every 24 hours Vancomycin 2 gram IVPB every 24 hours   Consult your pharmacist for dosing of vancomycin depending on vanco trough  Aim for Vancomycin trough 15-20   Duration: 6 weeks End Date: 05/3019  [facility] Care Per Protocol:including placement of biopatch  Labs every Monday weekly  while on IV antibiotics: _X_ CBC with differential _X_ CMP _X_ Vancomycin trough Labs every Thursday (weekly) while on antibiotics BMP Vancomycin trough  Labs every other Monday ESR  CRP ( regular and not the high sensitive CRP)  Fax weekly labs to  Dr.Micharl [phone]  PIC care including placement of biopatch  _X_ Please pull PIC at completion of IV antibiotics _ Clinic Follow Up Appt:in 3 weeks  Call [phone] to make appt

## 2019-05-08 NOTE — Progress Notes (Signed)
Physical Therapy Treatment Patient Details Name: Carl Yang MRN: [id number] DOB: 08-14-1941 Today's Date: 05/08/2019    History of Present Illness Pt is a 78 yo male s/p R TKA revision secondary to infection.  PMH includes: HTN, MI, cardiac stents, DM, peripheral neuropathy, L toe amputations, and OSA.  MD assessment 05/06/19 includes: Arrhythmia which is consistent with PVC: Patient is now in normal sinus rhythm, septic R total knee, DM, h/o DVT, and OSA with CPAP at night.    PT Comments    Pt presents with deficits in strength, transfers, R knee ROM, gait, balance, and activity tolerance.  Pt was Ind with bed mobility tasks with good RLE control.  Pt was SBA with transfers with good eccentric and concentric control and good stability upon initial stand.  Pt was able to amb 20' with a RW and CGA with distance limited by writer secondary to R knee noted to begin bleeding during amb, nursing notified.  Pt will benefit from HHPT services upon discharge to safely address above deficits for decreased caregiver assistance and eventual return to PLOF.     Follow Up Recommendations  Home health PT     Equipment Recommendations  None recommended by PT    Recommendations for Other Services       Precautions / Restrictions Precautions Precautions: Fall Precaution Comments: Watch HR Restrictions Weight Bearing Restrictions: Yes RLE Weight Bearing: Weight bearing as tolerated    Mobility  Bed Mobility Overal bed mobility: Independent             General bed mobility comments: Good speed and effort with bed mobility tasks  Transfers Overall transfer level: Needs assistance Equipment used: Rolling walker (2 wheeled) Transfers: Sit to/from Stand Sit to Stand: Supervision         General transfer comment: Good eccentric and concentric control  Ambulation/Gait Ambulation/Gait assistance: Min guard Gait Distance (Feet): 80 Feet Assistive device: Rolling walker (2  wheeled) Gait Pattern/deviations: Decreased step length - left;Decreased stance time - right;Antalgic;Step-through pattern Gait velocity: decreased   General Gait Details: Mildly antalgic step-through pattern with good stability.  Amb distance limited secondary to pt's R knee began bleeding, nursing notified.   Stairs             Wheelchair Mobility    Modified Rankin (Stroke Patients Only)       Balance Overall balance assessment: Needs assistance   Sitting balance-Leahy Scale: Normal     Standing balance support: Bilateral upper extremity supported Standing balance-Leahy Scale: Good Standing balance comment: Mod lean on the RW in standing but steady without LOB                            Cognition Arousal/Alertness: Awake/alert Behavior During Therapy: WFL for tasks assessed/performed Overall Cognitive Status: Within Functional Limits for tasks assessed                                        Exercises Total Joint Exercises Ankle Circles/Pumps: AROM;Both;10 reps;15 reps Quad Sets: AROM;Strengthening;Right;10 reps;15 reps Gluteal Sets: Strengthening;Both;10 reps;15 reps Hip ABduction/ADduction: AROM;Both;10 reps Straight Leg Raises: AROM;Both;10 reps Long Arc Quad: AROM;Strengthening;Right;10 reps Knee Flexion: AROM;Strengthening;Right;10 reps Goniometric ROM: Not measured this session secondary to R knee began bleeding during ambulation Marching in Standing: AROM;Both;Standing;10 reps Other Exercises Other Exercises: Pt review on proper sequencing during 90 deg turns  to the R to prevent CKC twisting on the R knee    General Comments        Pertinent Vitals/Pain Pain Assessment: 0-10 Pain Score: 2  Pain Location: R knee Pain Descriptors / Indicators: Sore Pain Intervention(s): Premedicated before session;Monitored during session    Home Living                      Prior Function            PT Goals (current  goals can now be found in the care plan section) Progress towards PT goals: Progressing toward goals    Frequency    BID      PT Plan Current plan remains appropriate    Co-evaluation              AM-PAC PT "6 Clicks" Mobility   Outcome Measure  Help needed turning from your back to your side while in a flat bed without using bedrails?: None Help needed moving from lying on your back to sitting on the side of a flat bed without using bedrails?: None Help needed moving to and from a bed to a chair (including a wheelchair)?: A Little Help needed standing up from a chair using your arms (e.g., wheelchair or bedside chair)?: A Little Help needed to walk in hospital room?: A Little Help needed climbing 3-5 steps with a railing? : A Little 6 Click Score: 20    End of Session Equipment Utilized During Treatment: Gait belt Activity Tolerance: Patient tolerated treatment well Patient left: Other (comment)(Pt left on [facility] with instructions to call nursing when ready) Nurse Communication: Mobility status;Other (comment)(R knee bleeding during ambulation) PT Visit Diagnosis: Other abnormalities of gait and mobility (R26.89);Muscle weakness (generalized) (M62.81)     Time: 1000-1024 PT Time Calculation (min) (ACUTE ONLY): 24 min  Charges:  $Gait Training: 8-22 mins $Therapeutic Exercise: 8-22 mins                     D. Scott Janijah Symons PT, DPT 05/08/19, 12:05 PM

## 2019-05-08 NOTE — Discharge Instructions (Signed)
Diet: As you were doing prior to hospitalization   Shower:  May shower but keep the wounds dry, use an occlusive plastic wrap, NO SOAKING IN TUB.  If the bandage gets wet, change with a clean dry gauze.  Dressing:  You may change your dressing as needed. Change the dressing with sterile gauze dressing.    Activity:  Increase activity slowly as tolerated, but follow the weight bearing instructions below.  No lifting or driving for 6 weeks.  Weight Bearing:   Weight bearing as tolerated to right lower extremity  IV antibiotics per infectious disease.  To prevent constipation: you may use a stool softener such as -  Colace (over the counter) 100 mg by mouth twice a day  Drink plenty of fluids (prune juice may be helpful) and high fiber foods Miralax (over the counter) for constipation as needed.    Itching:  If you experience itching with your medications, try taking only a single pain pill, or even half a pain pill at a time.  You may take up to 10 pain pills per day, and you can also use benadryl over the counter for itching or also to help with sleep.   Precautions:  If you experience chest pain or shortness of breath - call 911 immediately for transfer to the hospital emergency department!!  If you develop a fever greater that 101 F, purulent drainage from wound, increased redness or drainage from wound, or calf pain-Call [facility]                                              Follow- Up Appointment:  Please call for an appointment to be seen in 2 weeks at [facility]

## 2019-05-08 NOTE — TOC Transition Note (Signed)
Transition of Care [facility]) - CM/SW Discharge Note   Patient Details  Name: Carl Yang MRN: [id number] Date of Birth: [birth year]  [facility] CM/SW Contact:  Su Hilt, RN Phone Number: 05/08/2019, 1:44 PM   Clinical Narrative:     Patient discharging to [facility] room 352 via EMS, bedside nurse to call report to [phone] when ready  Final next level of care: Skilled Nursing Facility Barriers to Discharge: Continued Medical Work up   Patient Goals and CMS Choice Patient states their goals for this hospitalization and ongoing recovery are:: Go to [facility] SNF      Discharge Placement                       Discharge Plan and Services   Discharge Planning Services: CM Consult            DME Arranged: N/A         HH Arranged: NA          Social Determinants of Health (SDOH) Interventions     Readmission Risk Interventions No flowsheet data found.

## 2019-05-08 NOTE — Progress Notes (Signed)
Subjective: 3 Days Post-Op Procedure(s) (LRB): TOTAL KNEE REVISION (Right) Patient reports pain as mild.   Patient is well, without any acute complaints. Current plan is for discharge to [facility] upon discharge. ID has been consulted for the patient as well.   Plan for IV antibiotics following discharge for 6 weeks. Negative for chest pain and shortness of breath Fever: no Gastrointestinal:Negative for nausea and vomiting  Objective: Vital signs in last 24 hours: Temp:  [98.2 F (36.8 C)] 98.2 F (36.8 C) (08/20 2321) Pulse Rate:  [74-81] 74 (08/20 2321) Resp:  [18-19] 19 (08/20 2321) BP: (129-148)/(61-64) 129/64 (08/20 2321) SpO2:  [97 %-100 %] 97 % (08/20 2321)  Intake/Output from previous day:  Intake/Output Summary (Last 24 hours) at 05/08/2019 0801 Last data filed at 05/08/2019 0345 Gross per 24 hour  Intake 833 ml  Output 650 ml  Net 183 ml    Intake/Output this shift: No intake/output data recorded.  Labs: Recent Labs    05/05/19 1852 05/06/19 0443 05/07/19 0445 05/08/19 0418  HGB 13.0 12.2* 11.1* 10.8*   Recent Labs    05/07/19 0445 05/08/19 0418  WBC 8.9 6.9  RBC 3.91* 3.78*  HCT 35.6* 34.5*  PLT 196 185   Recent Labs    05/07/19 0445 05/08/19 0418  NA 135 136  K 4.7 4.6  CL 108 108  CO2 21* 23  BUN 29* 24*  CREATININE 1.35* 1.23  GLUCOSE 168* 93  CALCIUM 8.6* 8.5*   Recent Labs    05/07/19 0445 05/08/19 0418  INR 1.6* 2.0*     EXAM General - Patient is Alert, Appropriate and Oriented Extremity - ABD soft Sensation intact distally Intact pulses distally Dorsiflexion/Plantar flexion intact Incision: moderate drainage No cellulitis present Dressing/Incision - Bloody drainage noted to the right knee this AM. Motor Function - intact, moving foot and toes well on exam.   Past Medical History:  Diagnosis Date  . Arthritis   . Cancer ([place])    Melanoma  . Complication of anesthesia    After surgery at [facility], the patient  coded during the night.  . Coronary artery disease    Stents  . Diabetes mellitus without complication ([place])   . DVT (deep venous thrombosis) ([facility])    Right lower limb  . Dyspnea   . GERD (gastroesophageal reflux disease)   . Myocardial infarction ([place])    1998, 2003  . Neuromuscular disorder ([place])    Peripheral Neuropathy r/t DM  . Peripheral vascular disease ([place])    Diabetes  . Sleep apnea    Uses CPAP    Assessment/Plan: 3 Days Post-Op Procedure(s) (LRB): TOTAL KNEE REVISION (Right) Active Problems:   Status post revision of total knee replacement, right  Estimated body mass index is 27.83 kg/m as calculated from the following:   Height as of this encounter: 6\' 3"  (1.905 m).   Weight as of this encounter: 101 kg. Advance diet Up with therapy   Labs reviewed this AM.  Hg 10.8 this AM.  WBC 6.9. Glucose 93 today. ID has been consulted.  No growth on cultures at this time. Current plan is for discharge on Vanco and Ceftriaxone pending changes to cultures. Plan for PICC line placement today. Continue with PT.   Begin working on having a BM. PT 2.0 this morning, will discharge on Coumadin. Plan for discharge this afternoon.  DVT Prophylaxis - Lovenox, Coumadin, Foot Pumps and TED hose Weight-Bearing as tolerated to right leg  J. Cameron Proud,  PA-C [facility] Orthopaedic Surgery 05/08/2019, 8:01 AM

## 2019-05-08 NOTE — Progress Notes (Signed)
Pharmacy - Brief Note (vancomycin follow-up)  See pharmacist's note from last evening for details.  Vancomycin levels ordered yday.  Trough was elevated (=24 mcg/mL) and dose adjusted from 1250mg  IV q12h to 1gm IV q12h.  Appears SCr = 0.94 on 8/18 was outlier from his normal SCr as previous SCr are typically in 1.2 or so range  Plan: - Adjust vancomycin to 2gm IV q24h for est pk = 30, trugh = 15 - Vancomycin 1gm already given this am - can give additional 1gm prior to discharge so next dose due 8/22  Doreene Eland, PharmD, BCPS.   Work Cell: [phone] 05/08/2019 8:55 AM

## 2019-05-08 NOTE — Progress Notes (Signed)
Pt ready for discharge to SNF per MD. Pt assessment unchanged from this morning. Report called to Bet at [facility]; all questions answered and discharge instructions reviewed. EMS transportation set up for pt by RN. PIV removed, VSS. Pt belongings packed.   Ethelda Chick

## 2019-05-08 NOTE — Progress Notes (Signed)
Peripherally Inserted Central Catheter/Midline Placement  The IV Nurse has discussed with the patient and/or persons authorized to consent for the patient, the purpose of this procedure and the potential benefits and risks involved with this procedure.  The benefits include less needle sticks, lab draws from the catheter, and the patient may be discharged home with the catheter. Risks include, but not limited to, infection, bleeding, blood clot (thrombus formation), and puncture of an artery; nerve damage and irregular heartbeat and possibility to perform a PICC exchange if needed/ordered by physician.  Alternatives to this procedure were also discussed.  Bard Power PICC patient education guide, fact sheet on infection prevention and patient information card has been provided to patient /or left at bedside.    PICC/Midline Placement Documentation  PICC Single Lumen [id number] PICC Right Basilic 41 cm 0 cm (Active)  Indication for Insertion or Continuance of Line Home intravenous therapies (PICC only) 05/08/19 1600  Exposed Catheter (cm) 0 cm 05/08/19 1600  Site Assessment Clean;Dry;Intact 05/08/19 1600  Line Status Flushed;Saline locked;Blood return noted 05/08/19 1600  Dressing Type Transparent;Securing device 05/08/19 1600  Dressing Status Clean;Dry;Intact;Antimicrobial disc in place 05/08/19 1600  Dressing Change Due 05/15/19 05/08/19 1600       Holley Bouche Renee 05/08/2019, 4:50 PM

## 2019-05-09 ENCOUNTER — Other Ambulatory Visit
Admission: RE | Admit: 2019-05-09 | Discharge: 2019-05-09 | Disposition: A | Discharge status: Home / Self Care | Payer: Medicare Other | Source: Ambulatory Visit | Attending: Internal Medicine | Admitting: Internal Medicine

## 2019-05-10 ENCOUNTER — Other Ambulatory Visit
Admission: RE | Admit: 2019-05-10 | Discharge: 2019-05-10 | Disposition: A | Discharge status: Home / Self Care | Payer: Medicare Other | Source: Ambulatory Visit | Attending: Internal Medicine | Admitting: Internal Medicine

## 2019-05-10 DIAGNOSIS — I4891 Unspecified atrial fibrillation: Secondary | ICD-10-CM | POA: Insufficient documentation

## 2019-05-10 LAB — PROTIME-INR
INR: 2 — ABNORMAL HIGH (ref 0.8–1.2)
Prothrombin Time: 22.8 seconds — ABNORMAL HIGH (ref 11.4–15.2)

## 2019-05-11 ENCOUNTER — Other Ambulatory Visit
Admission: RE | Admit: 2019-05-11 | Discharge: 2019-05-11 | Disposition: A | Discharge status: Home / Self Care | Payer: Medicare Other | Source: Ambulatory Visit | Attending: Internal Medicine | Admitting: Internal Medicine

## 2019-05-11 DIAGNOSIS — I4891 Unspecified atrial fibrillation: Secondary | ICD-10-CM | POA: Diagnosis present

## 2019-05-11 LAB — CBC WITH DIFFERENTIAL/PLATELET
Abs Immature Granulocytes: 0.03 10*3/uL (ref 0.00–0.07)
Basophils Absolute: 0.1 10*3/uL (ref 0.0–0.1)
Basophils Relative: 1 %
Eosinophils Absolute: 0.4 10*3/uL (ref 0.0–0.5)
Eosinophils Relative: 5 %
HCT: 31.8 % — ABNORMAL LOW (ref 39.0–52.0)
Hemoglobin: 10.1 g/dL — ABNORMAL LOW (ref 13.0–17.0)
Immature Granulocytes: 0 %
Lymphocytes Relative: 20 %
Lymphs Abs: 1.4 10*3/uL (ref 0.7–4.0)
MCH: 28.7 pg (ref 26.0–34.0)
MCHC: 31.8 g/dL (ref 30.0–36.0)
MCV: 90.3 fL (ref 80.0–100.0)
Monocytes Absolute: 0.8 10*3/uL (ref 0.1–1.0)
Monocytes Relative: 11 %
Neutro Abs: 4.4 10*3/uL (ref 1.7–7.7)
Neutrophils Relative %: 63 %
Platelets: 172 10*3/uL (ref 150–400)
RBC: 3.52 MIL/uL — ABNORMAL LOW (ref 4.22–5.81)
RDW: 15.1 % (ref 11.5–15.5)
WBC: 7 10*3/uL (ref 4.0–10.5)
nRBC: 0 % (ref 0.0–0.2)

## 2019-05-11 LAB — COMPREHENSIVE METABOLIC PANEL
ALT: 45 U/L — ABNORMAL HIGH (ref 0–44)
AST: 35 U/L (ref 15–41)
Albumin: 2.6 g/dL — ABNORMAL LOW (ref 3.5–5.0)
Alkaline Phosphatase: 75 U/L (ref 38–126)
Anion gap: 8 (ref 5–15)
BUN: 18 mg/dL (ref 8–23)
CO2: 23 mmol/L (ref 22–32)
Calcium: 8.1 mg/dL — ABNORMAL LOW (ref 8.9–10.3)
Chloride: 106 mmol/L (ref 98–111)
Creatinine, Ser: 1.14 mg/dL (ref 0.61–1.24)
GFR calc Af Amer: >60 mL/min (ref >60)
GFR calc non Af Amer: >60 mL/min (ref >60)
Glucose, Bld: 202 mg/dL — ABNORMAL HIGH (ref 70–99)
Potassium: 4.8 mmol/L (ref 3.5–5.1)
Sodium: 137 mmol/L (ref 135–145)
Total Bilirubin: 0.8 mg/dL (ref 0.3–1.2)
Total Protein: 5.5 g/dL — ABNORMAL LOW (ref 6.5–8.1)

## 2019-05-11 LAB — SEDIMENTATION RATE: Sed Rate: 30 mm/hr — ABNORMAL HIGH (ref 0–20)

## 2019-05-11 LAB — C-REACTIVE PROTEIN: CRP: 2.6 mg/dL — ABNORMAL HIGH (ref <1.0)

## 2019-05-11 LAB — VANCOMYCIN, TROUGH: Vancomycin Tr: 20 ug/mL (ref 15–20)

## 2019-05-12 ENCOUNTER — Other Ambulatory Visit
Admission: RE | Admit: 2019-05-12 | Discharge: 2019-05-12 | Disposition: A | Discharge status: Home / Self Care | Payer: Medicare Other | Source: Ambulatory Visit | Attending: Internal Medicine | Admitting: Internal Medicine

## 2019-05-12 DIAGNOSIS — Z792 Long term (current) use of antibiotics: Secondary | ICD-10-CM | POA: Insufficient documentation

## 2019-05-12 DIAGNOSIS — Z5181 Encounter for therapeutic drug level monitoring: Secondary | ICD-10-CM | POA: Insufficient documentation

## 2019-05-12 LAB — VANCOMYCIN, TROUGH: Vancomycin Tr: 19 ug/mL (ref 15–20)

## 2019-05-14 ENCOUNTER — Other Ambulatory Visit
Admission: RE | Admit: 2019-05-14 | Discharge: 2019-05-14 | Disposition: A | Discharge status: Home / Self Care | Payer: Medicare Other | Source: Ambulatory Visit | Attending: Internal Medicine | Admitting: Internal Medicine

## 2019-05-14 DIAGNOSIS — L03115 Cellulitis of right lower limb: Secondary | ICD-10-CM | POA: Insufficient documentation

## 2019-05-14 DIAGNOSIS — Z96651 Presence of right artificial knee joint: Secondary | ICD-10-CM | POA: Insufficient documentation

## 2019-05-14 LAB — BASIC METABOLIC PANEL
Anion gap: 11 (ref 5–15)
BUN: 18 mg/dL (ref 8–23)
CO2: 21 mmol/L — ABNORMAL LOW (ref 22–32)
Calcium: 8.3 mg/dL — ABNORMAL LOW (ref 8.9–10.3)
Chloride: 106 mmol/L (ref 98–111)
Creatinine, Ser: 1.23 mg/dL (ref 0.61–1.24)
GFR calc Af Amer: >60 mL/min (ref >60)
GFR calc non Af Amer: 57 mL/min — ABNORMAL LOW (ref >60)
Glucose, Bld: 154 mg/dL — ABNORMAL HIGH (ref 70–99)
Potassium: 4.3 mmol/L (ref 3.5–5.1)
Sodium: 138 mmol/L (ref 135–145)

## 2019-05-14 LAB — VANCOMYCIN, TROUGH: Vancomycin Tr: 21 ug/mL (ref 15–20)

## 2019-05-15 ENCOUNTER — Encounter
Admission: RE | Admit: 2019-05-15 | Discharge: 2019-05-15 | Disposition: A | Discharge status: Home / Self Care | Payer: Medicare Other | Source: Ambulatory Visit | Attending: Internal Medicine | Admitting: Internal Medicine

## 2019-05-15 LAB — BASIC METABOLIC PANEL
Anion gap: 9 (ref 5–15)
BUN: 17 mg/dL (ref 8–23)
CO2: 24 mmol/L (ref 22–32)
Calcium: 8.5 mg/dL — ABNORMAL LOW (ref 8.9–10.3)
Chloride: 104 mmol/L (ref 98–111)
Creatinine, Ser: 1.32 mg/dL — ABNORMAL HIGH (ref 0.61–1.24)
GFR calc Af Amer: >60 mL/min (ref >60)
GFR calc non Af Amer: 52 mL/min — ABNORMAL LOW (ref >60)
Glucose, Bld: 159 mg/dL — ABNORMAL HIGH (ref 70–99)
Potassium: 4.3 mmol/L (ref 3.5–5.1)
Sodium: 137 mmol/L (ref 135–145)

## 2019-05-15 LAB — VANCOMYCIN, TROUGH: Vancomycin Tr: 17 ug/mL (ref 15–20)

## 2019-05-18 ENCOUNTER — Other Ambulatory Visit
Admission: RE | Admit: 2019-05-18 | Discharge: 2019-05-18 | Disposition: A | Discharge status: Home / Self Care | Payer: Medicare Other | Source: Ambulatory Visit | Attending: Internal Medicine | Admitting: Internal Medicine

## 2019-05-18 DIAGNOSIS — Z792 Long term (current) use of antibiotics: Secondary | ICD-10-CM | POA: Insufficient documentation

## 2019-05-18 LAB — BASIC METABOLIC PANEL
Anion gap: 9 (ref 5–15)
BUN: 22 mg/dL (ref 8–23)
CO2: 22 mmol/L (ref 22–32)
Calcium: 8.1 mg/dL — ABNORMAL LOW (ref 8.9–10.3)
Chloride: 103 mmol/L (ref 98–111)
Creatinine, Ser: 1.12 mg/dL (ref 0.61–1.24)
GFR calc Af Amer: >60 mL/min (ref >60)
GFR calc non Af Amer: >60 mL/min (ref >60)
Glucose, Bld: 164 mg/dL — ABNORMAL HIGH (ref 70–99)
Potassium: 4.6 mmol/L (ref 3.5–5.1)
Sodium: 134 mmol/L — ABNORMAL LOW (ref 135–145)

## 2019-05-18 LAB — VANCOMYCIN, TROUGH: Vancomycin Tr: 14 ug/mL — ABNORMAL LOW (ref 15–20)

## 2019-05-19 ENCOUNTER — Other Ambulatory Visit
Admission: RE | Admit: 2019-05-19 | Discharge: 2019-05-19 | Disposition: A | Discharge status: Home / Self Care | Payer: Medicare Other | Source: Ambulatory Visit | Attending: Internal Medicine | Admitting: Internal Medicine

## 2019-05-19 ENCOUNTER — Encounter: Admission: RE | Admit: 2019-05-19 | Payer: Medicare Other | Source: Ambulatory Visit | Admitting: Internal Medicine

## 2019-05-19 DIAGNOSIS — Z86718 Personal history of other venous thrombosis and embolism: Secondary | ICD-10-CM | POA: Insufficient documentation

## 2019-05-19 DIAGNOSIS — Z7901 Long term (current) use of anticoagulants: Secondary | ICD-10-CM | POA: Diagnosis not present

## 2019-05-19 LAB — PROTIME-INR
INR: 2.2 — ABNORMAL HIGH (ref 0.8–1.2)
Prothrombin Time: 23.8 seconds — ABNORMAL HIGH (ref 11.4–15.2)

## 2019-05-22 ENCOUNTER — Other Ambulatory Visit
Admission: RE | Admit: 2019-05-22 | Discharge: 2019-05-22 | Disposition: A | Discharge status: Home / Self Care | Payer: Medicare Other | Source: Ambulatory Visit | Attending: Internal Medicine | Admitting: Internal Medicine

## 2019-05-22 DIAGNOSIS — T8453XA Infection and inflammatory reaction due to internal right knee prosthesis, initial encounter: Secondary | ICD-10-CM | POA: Diagnosis present

## 2019-05-22 LAB — BASIC METABOLIC PANEL
Anion gap: 11 (ref 5–15)
BUN: 22 mg/dL (ref 8–23)
CO2: 22 mmol/L (ref 22–32)
Calcium: 8.6 mg/dL — ABNORMAL LOW (ref 8.9–10.3)
Chloride: 104 mmol/L (ref 98–111)
Creatinine, Ser: 1.24 mg/dL (ref 0.61–1.24)
GFR calc Af Amer: >60 mL/min (ref >60)
GFR calc non Af Amer: 56 mL/min — ABNORMAL LOW (ref >60)
Glucose, Bld: 161 mg/dL — ABNORMAL HIGH (ref 70–99)
Potassium: 4.8 mmol/L (ref 3.5–5.1)
Sodium: 137 mmol/L (ref 135–145)

## 2019-05-22 LAB — VANCOMYCIN, TROUGH: Vancomycin Tr: 15 ug/mL (ref 15–20)

## 2019-05-26 ENCOUNTER — Other Ambulatory Visit
Admission: RE | Admit: 2019-05-26 | Discharge: 2019-05-26 | Disposition: A | Discharge status: Home / Self Care | Payer: Medicare Other | Source: Skilled Nursing Facility | Attending: Internal Medicine | Admitting: Internal Medicine

## 2019-05-26 DIAGNOSIS — T8453XA Infection and inflammatory reaction due to internal right knee prosthesis, initial encounter: Secondary | ICD-10-CM | POA: Diagnosis present

## 2019-05-26 DIAGNOSIS — X58XXXA Exposure to other specified factors, initial encounter: Secondary | ICD-10-CM | POA: Insufficient documentation

## 2019-05-26 LAB — C-REACTIVE PROTEIN: CRP: <0.8 mg/dL (ref <1.0)

## 2019-05-26 LAB — BASIC METABOLIC PANEL
Anion gap: 9 (ref 5–15)
BUN: 24 mg/dL — ABNORMAL HIGH (ref 8–23)
CO2: 23 mmol/L (ref 22–32)
Calcium: 8.5 mg/dL — ABNORMAL LOW (ref 8.9–10.3)
Chloride: 105 mmol/L (ref 98–111)
Creatinine, Ser: 1.23 mg/dL (ref 0.61–1.24)
GFR calc Af Amer: >60 mL/min (ref >60)
GFR calc non Af Amer: 57 mL/min — ABNORMAL LOW (ref >60)
Glucose, Bld: 145 mg/dL — ABNORMAL HIGH (ref 70–99)
Potassium: 4.5 mmol/L (ref 3.5–5.1)
Sodium: 137 mmol/L (ref 135–145)

## 2019-05-26 LAB — AEROBIC/ANAEROBIC CULTURE W GRAM STAIN (SURGICAL/DEEP WOUND)
Culture: NO GROWTH
Culture: NO GROWTH

## 2019-05-26 LAB — SEDIMENTATION RATE: Sed Rate: 11 mm/hr (ref 0–20)

## 2019-05-26 LAB — VANCOMYCIN, TROUGH: Vancomycin Tr: 13 ug/mL — ABNORMAL LOW (ref 15–20)

## 2019-05-27 ENCOUNTER — Telehealth: Payer: Self-pay | Admitting: Licensed Clinical Social Worker

## 2019-05-27 LAB — CULTURE, FUNGUS WITHOUT SMEAR: Culture: NO GROWTH

## 2019-05-27 NOTE — Telephone Encounter (Signed)
Spoke with Joelene Millin and they can do a virtual visit with the patient. She would like the link to be sent to [email or web address], if problems with email call [phone]

## 2019-05-27 NOTE — Telephone Encounter (Signed)
I called [facility] to set up a hospital follow for the patient and transportation is available on Mon, Wed, Fri. Our clinic days are Tues, and Thurs. I left Joelene Millin the Education officer, museum for [employer] a message to call me back to set up a virtual visit.

## 2019-06-02 ENCOUNTER — Other Ambulatory Visit
Admission: RE | Admit: 2019-06-02 | Discharge: 2019-06-02 | Disposition: A | Discharge status: Home / Self Care | Payer: Medicare Other | Source: Ambulatory Visit | Attending: Internal Medicine | Admitting: Internal Medicine

## 2019-06-02 DIAGNOSIS — Z7901 Long term (current) use of anticoagulants: Secondary | ICD-10-CM | POA: Insufficient documentation

## 2019-06-02 DIAGNOSIS — Z79899 Other long term (current) drug therapy: Secondary | ICD-10-CM | POA: Insufficient documentation

## 2019-06-02 DIAGNOSIS — L089 Local infection of the skin and subcutaneous tissue, unspecified: Secondary | ICD-10-CM | POA: Diagnosis present

## 2019-06-02 LAB — BASIC METABOLIC PANEL
Anion gap: 10 (ref 5–15)
BUN: 25 mg/dL — ABNORMAL HIGH (ref 8–23)
CO2: 23 mmol/L (ref 22–32)
Calcium: 8.8 mg/dL — ABNORMAL LOW (ref 8.9–10.3)
Chloride: 103 mmol/L (ref 98–111)
Creatinine, Ser: 1.2 mg/dL (ref 0.61–1.24)
GFR calc Af Amer: >60 mL/min (ref >60)
GFR calc non Af Amer: 58 mL/min — ABNORMAL LOW (ref >60)
Glucose, Bld: 197 mg/dL — ABNORMAL HIGH (ref 70–99)
Potassium: 4.4 mmol/L (ref 3.5–5.1)
Sodium: 136 mmol/L (ref 135–145)

## 2019-06-02 LAB — PROTIME-INR
INR: 1.6 — ABNORMAL HIGH (ref 0.8–1.2)
Prothrombin Time: 19.2 seconds — ABNORMAL HIGH (ref 11.4–15.2)

## 2019-06-02 LAB — VANCOMYCIN, TROUGH: Vancomycin Tr: 13 ug/mL — ABNORMAL LOW (ref 15–20)

## 2019-06-04 ENCOUNTER — Other Ambulatory Visit: Payer: Self-pay

## 2019-06-04 ENCOUNTER — Ambulatory Visit: Payer: Medicare Other | Attending: Infectious Diseases | Admitting: Infectious Diseases

## 2019-06-04 DIAGNOSIS — E119 Type 2 diabetes mellitus without complications: Secondary | ICD-10-CM | POA: Diagnosis not present

## 2019-06-04 DIAGNOSIS — E785 Hyperlipidemia, unspecified: Secondary | ICD-10-CM | POA: Diagnosis not present

## 2019-06-04 DIAGNOSIS — Z794 Long term (current) use of insulin: Secondary | ICD-10-CM

## 2019-06-04 DIAGNOSIS — Z9641 Presence of insulin pump (external) (internal): Secondary | ICD-10-CM

## 2019-06-04 DIAGNOSIS — T8453XD Infection and inflammatory reaction due to internal right knee prosthesis, subsequent encounter: Secondary | ICD-10-CM

## 2019-06-04 DIAGNOSIS — Z79899 Other long term (current) drug therapy: Secondary | ICD-10-CM

## 2019-06-04 NOTE — Progress Notes (Signed)
The purpose of this virtual visit is to provide medical care while limiting exposure to the novel coronavirus (COVID19) for both patient and office staff.   Consent was obtained for phone visit:  Yes.   Answered questions that patient had about telehealth interaction:  Yes.   I discussed the limitations, risks, security and privacy concerns of performing an evaluation and management service by telephone. I also discussed with the patient that there may be a patient responsible charge related to this service. The patient expressed understanding and agreed to proceed.   Patient Location: Home Provider Location:office  Pt with rt PJI infection- S/p 1 stage revision on 05/05/19- culture negative On Vancomcycin Iv and ceftriaxone IV for 6 weeks until 06/17/19. Doing well Participating in PT Rt knee pain and swelling much better Staples removed  No fever, or rash, or diarrhea No cough or SOB No edema legs 100% adherent to Iv antibiotics- [facility]  Medication reconcilation Metformin Insulin vanco Ceftriaxone Tramadol Oxycodone Ramipril Atorvastatin Coumadin  Plan Rt PJI- culture negative- 1 stage revision- continue IV vancomycin and ceftriaxone for 6 weeks unitl 06/17/19. PICC can be removed after that. He will go on Doxycycline 100mg  Po BID for 3-6 months after that. ( may need indefinitely) will discuss with Dr.Poggi  DM- on metformin and insulin- he has an insulin pump and the dose was adjusted recently because of low sugar  Hyperlipidemia on atorvastatin  Discussed the management in detail with the patient- answered all his questions

## 2019-06-08 ENCOUNTER — Other Ambulatory Visit
Admission: RE | Admit: 2019-06-08 | Discharge: 2019-06-08 | Disposition: A | Discharge status: Home / Self Care | Payer: Medicare Other | Source: Ambulatory Visit | Attending: Internal Medicine | Admitting: Internal Medicine

## 2019-06-08 DIAGNOSIS — M009 Pyogenic arthritis, unspecified: Secondary | ICD-10-CM | POA: Insufficient documentation

## 2019-06-08 LAB — C-REACTIVE PROTEIN: CRP: <0.8 mg/dL (ref <1.0)

## 2019-06-08 LAB — SEDIMENTATION RATE: Sed Rate: 8 mm/hr (ref 0–20)

## 2019-06-09 ENCOUNTER — Other Ambulatory Visit
Admission: RE | Admit: 2019-06-09 | Discharge: 2019-06-09 | Disposition: A | Discharge status: Home / Self Care | Payer: Medicare Other | Source: Ambulatory Visit | Attending: Internal Medicine | Admitting: Internal Medicine

## 2019-06-09 DIAGNOSIS — T8453XA Infection and inflammatory reaction due to internal right knee prosthesis, initial encounter: Secondary | ICD-10-CM | POA: Insufficient documentation

## 2019-06-09 DIAGNOSIS — Z7901 Long term (current) use of anticoagulants: Secondary | ICD-10-CM | POA: Insufficient documentation

## 2019-06-09 DIAGNOSIS — Z79899 Other long term (current) drug therapy: Secondary | ICD-10-CM | POA: Diagnosis not present

## 2019-06-09 LAB — BASIC METABOLIC PANEL
Anion gap: 9 (ref 5–15)
BUN: 24 mg/dL — ABNORMAL HIGH (ref 8–23)
CO2: 24 mmol/L (ref 22–32)
Calcium: 8.8 mg/dL — ABNORMAL LOW (ref 8.9–10.3)
Chloride: 103 mmol/L (ref 98–111)
Creatinine, Ser: 1.34 mg/dL — ABNORMAL HIGH (ref 0.61–1.24)
GFR calc Af Amer: 59 mL/min — ABNORMAL LOW (ref >60)
GFR calc non Af Amer: 51 mL/min — ABNORMAL LOW (ref >60)
Glucose, Bld: 227 mg/dL — ABNORMAL HIGH (ref 70–99)
Potassium: 4.6 mmol/L (ref 3.5–5.1)
Sodium: 136 mmol/L (ref 135–145)

## 2019-06-09 LAB — PROTIME-INR
INR: 1.8 — ABNORMAL HIGH (ref 0.8–1.2)
Prothrombin Time: 20.7 seconds — ABNORMAL HIGH (ref 11.4–15.2)

## 2019-06-09 LAB — VANCOMYCIN, TROUGH: Vancomycin Tr: 13 ug/mL — ABNORMAL LOW (ref 15–20)

## 2019-06-12 ENCOUNTER — Encounter: Payer: Self-pay | Admitting: Surgery

## 2019-06-17 ENCOUNTER — Other Ambulatory Visit: Payer: Self-pay | Admitting: Licensed Clinical Social Worker

## 2019-06-17 ENCOUNTER — Telehealth: Payer: Self-pay | Admitting: Licensed Clinical Social Worker

## 2019-06-17 NOTE — Telephone Encounter (Signed)
Thanks a lot- pt will have to follow up with me in 2 months-thx

## 2019-06-17 NOTE — Telephone Encounter (Signed)
Gave verbal order to Baxter Flattery, RN to pull PICC line after last dose of antibiotics. Patient will start on oral doxycycline 100 mg bid tomorrow. [facility] is not sure of his discharge date so they will fill it there.

## 2019-06-17 NOTE — Progress Notes (Signed)
error 

## 2019-06-19 LAB — ACID FAST CULTURE WITH REFLEXED SENSITIVITIES (MYCOBACTERIA): Acid Fast Culture: NEGATIVE

## 2019-08-18 ENCOUNTER — Encounter: Payer: Medicare Other | Attending: "Endocrinology | Admitting: Dietician

## 2019-08-18 ENCOUNTER — Encounter: Payer: Self-pay | Admitting: Dietician

## 2019-08-18 ENCOUNTER — Other Ambulatory Visit: Payer: Self-pay

## 2019-08-18 VITALS — BP 116/68 | Ht 75.0 in | Wt 225.5 lb

## 2019-08-18 DIAGNOSIS — E1065 Type 1 diabetes mellitus with hyperglycemia: Secondary | ICD-10-CM

## 2019-08-18 DIAGNOSIS — E1042 Type 1 diabetes mellitus with diabetic polyneuropathy: Secondary | ICD-10-CM | POA: Diagnosis not present

## 2019-08-20 ENCOUNTER — Encounter: Payer: Self-pay | Admitting: Dietician

## 2019-08-20 NOTE — Progress Notes (Signed)
Diabetes Self-Management Education  Visit Type: First/Initial  Appt. Start Time: 1530 Appt. End Time: 1640  08/20/2019  Carl Yang, identified by name and date of birth, is a 78 y.o. male with a diagnosis of Diabetes: Type 1.   ASSESSMENT  Blood pressure 116/68, height 6\' 3"  (1.905 m), weight 225 lb 8 oz (102.3 kg). Body mass index is 28.19 kg/m.  Diabetes Self-Management Education - 08/20/19 1212      Visit Information   Visit Type  First/Initial      Initial Visit   Diabetes Type  Type 1      Health Coping   How would you rate your overall health?  Fair      Psychosocial Assessment   Patient Belief/Attitude about Diabetes  Motivated to manage diabetes    Self-care barriers  None    Self-management support  Friends;Doctor's office;Family    Other persons present  Patient    Patient Concerns  Glycemic Control   prevent complications   Special Needs  None    Preferred Learning Style  Visual;Hands on;Auditory;No preference indicated    Learning Readiness  Ready    What is the last grade level you completed in school?  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  Demonstrates understanding / competency    Patient understands incorporating nutritional management into lifestyle.  Needs Review    Patient undertands incorporating physical activity into lifestyle.  Demonstrates understanding / competency    Patient understands using medications safely.  Needs Review    Patient understands monitoring blood glucose, interpreting and using results  Needs Review    Patient understands prevention, detection, and treatment of acute complications.  Demonstrates understanding / competency    Patient understands prevention, detection, and treatment of chronic complications.  Demonstrates understanding / competency    Patient understands how to develop strategies to address psychosocial issues.   Demonstrates understanding / competency    Patient understands how to develop strategies to promote health/change behavior.  Demonstrates understanding / competency      Complications   How often do you check your blood sugar?  > 4 times/day    Fasting Blood glucose range (mg/dL)  130-179;180-200;70-129    Postprandial Blood glucose range (mg/dL)  180-200;>200    Have you had a dilated eye exam in the past 12 months?  Yes   06-2019   Have you had a dental exam in the past 12 months?  Yes   09-2018   Are you checking your feet?  Yes    How many days per week are you checking your feet?  7      Dietary Intake   Breakfast  eats breakfsat 8-9a    Lunch  eats lunch 12-1p; eats fried foods 4-5x/wk and sugar free desserts 4-5x/wk    Snack (afternoon)  eats snacks if having low BG PRN    Dinner  eats supper 5-6:30p    Snack (evening)  eats snack if having low BG    Beverage(s)  drinks water 2-3x/day and unsweetened beverages 2-3x/day, milk 1x/day, occasional fruit juice for low BG      Exercise   Exercise Type  ADL's;Light (walking / raking leaves)   limited due to 2nd RTK replacement in 04-2019-got infection in it-on antibiotic now; 1st time was in 07-2018   How many days per week to you exercise?  5    How  many minutes per day do you exercise?  60    Total minutes per week of exercise  300      Patient Education   Previous Diabetes Education  Yes (please comment)    Nutrition management   Carbohydrate counting   pt requests review of carb counting and pump review   Medications  Taught/reviewed insulin injection, site rotation, insulin storage and needle disposal.;Reviewed patients medication for diabetes, action, purpose, timing of dose and side effects.   reviewed use of Medtronic insulin pump and how to test basal rates and ICR; reviewed basic features of the pump; advised to take meal boluses 15 min. Prior to eating when able    Monitoring  --   pt inserted Dexcom 6 days ago and never  got any readings-had pt remove that sensor and instructed on use of Dexcom G6 and assisted pt on inserting a new dexcom sensor and pt started the session   Acute complications  Trained/discussed glucagon administration to patient and designated other.;Taught treatment of hypoglycemia - the 15 rule.    Chronic complications  Relationship between chronic complications and blood glucose control      Outcomes   Expected Outcomes  Demonstrated interest in learning. Expect positive outcomes       Individualized Plan for Diabetes Self-Management Training:   Learning Objective:  Patient will have a greater understanding of diabetes self-management. Patient education plan is to attend individual and/or group sessions per assessed needs and concerns.   Plan:  Complete a 3 day food record and estimate carbohydrate grams and bring to FU appointment with RD on 09-02-19 Check basal rates: check at bedtime, 2-3a and fasting to check overnight basal rates Once overnight testing is completed, then skip breakfast and keep record of sensor results  every 2 hours until lunchtime, then skip lunch and check  every 2 hours in afternoon Check Clarity reports for information to help with checking basal rates  and ICR Call Dexcom help line if issues with sensor Take meal bolus 15 min. before eating meals when able   Expected Outcomes:  Demonstrated interest in learning. Expect positive outcomes  Education material provided: Low BG handout  If problems or questions, patient to contact team via:  [phone]  Future DSME appointment:   09-02-19

## 2019-08-20 NOTE — Patient Instructions (Addendum)
   Complete a 3 day food record and estimate carbohydrate grams and bring to FU appointment with RD on 09-02-19 Keep records of sensor readings  Check basal rates: check at bedtime, 2-3a and fasting to check overnight basal rates Once overnight testing is completed, then skip breakfast and keep record of sensor results  every 2 hours until lunchtime, then skip lunch and check  every 2 hours in afternoon Check Clarity reports for information to help with checking basal rates  and ICR Call Dexcom help line if issues with sensor Take meal bolus 15 min before eating meals when able Complete 3 Day Food Record and bring to next appt Return for appointment/classes on:  09-02-19

## 2019-08-20 NOTE — Progress Notes (Signed)
Called pt-he reports that Dexcom sensor is working. Saw Dr Honor Junes on 08-19-19 and he did not make any changes in pump settings

## 2019-09-01 DIAGNOSIS — D638 Anemia in other chronic diseases classified elsewhere: Secondary | ICD-10-CM | POA: Insufficient documentation

## 2019-09-01 DIAGNOSIS — N183 Chronic kidney disease, stage 3 unspecified: Secondary | ICD-10-CM | POA: Insufficient documentation

## 2019-09-02 ENCOUNTER — Other Ambulatory Visit: Payer: Self-pay

## 2019-09-02 ENCOUNTER — Encounter: Payer: Self-pay | Admitting: Dietician

## 2019-09-02 ENCOUNTER — Encounter: Payer: Medicare Other | Admitting: Dietician

## 2019-09-02 VITALS — Ht 75.0 in | Wt 224.6 lb

## 2019-09-02 DIAGNOSIS — E1065 Type 1 diabetes mellitus with hyperglycemia: Secondary | ICD-10-CM

## 2019-09-02 DIAGNOSIS — E1042 Type 1 diabetes mellitus with diabetic polyneuropathy: Secondary | ICD-10-CM | POA: Diagnosis not present

## 2019-09-02 NOTE — Progress Notes (Signed)
Diabetes Self-Management Education  Visit Type:  Follow-up  Appt. Start Time: 1315 Appt. End Time: [id number]  09/02/2019  Mr. Carl Yang, identified by name and date of birth, is a 78 y.o. male with a diagnosis of Diabetes:  .   ASSESSMENT  Height 6\' 3"  (1.905 m), weight 224 lb 9.6 oz (101.9 kg). Body mass index is 28.07 kg/m.   Diabetes Self-Management Education - [id number]      Complications   How often do you check your blood sugar?  > 4 times/day   using dexcom continuous monitor     Dietary Intake   Breakfast  3 meals and 2 snacks daily      Exercise   Exercise Type  ADL's;Light (walking / raking leaves)    How many days per week to you exercise?  5    How many minutes per day do you exercise?  60    Total minutes per week of exercise  300      Patient Education   Nutrition management   Role of diet in the treatment of diabetes and the relationship between the three main macronutrients and blood glucose level;Food label reading, portion sizes and measuring food.;Carbohydrate counting    Monitoring  Taught/discussed recording of test results and interpretation of SMBG.      Post-Education Assessment   Patient understands the diabetes disease and treatment process.  Demonstrates understanding / competency    Patient understands incorporating nutritional management into lifestyle.  Demonstrates understanding / competency    Patient undertands incorporating physical activity into lifestyle.  Demonstrates understanding / competency    Patient understands using medications safely.  Demonstrates understanding / competency    Patient understands monitoring blood glucose, interpreting and using results  Demonstrates understanding / competency    Patient understands prevention, detection, and treatment of acute complications.  Demonstrates understanding / competency    Patient understands prevention, detection, and treatment of chronic complications.  Demonstrates understanding /  competency    Patient understands how to develop strategies to address psychosocial issues.  Demonstrates understanding / competency    Patient understands how to develop strategies to promote health/change behavior.  Demonstrates understanding / competency      Outcomes   Program Status  Completed       Learning Objective:  Patient will have a greater understanding of diabetes self-management. Patient education plan is to attend individual and/or group sessions per assessed needs and concerns.   Plan:   Patient Instructions   Try using Calorieking.com website to look up nutrition info on specific foods.   Can also search for restaurant websites that will provide nutrition information on menu items.   Protein foods such as eggs, meats, cheeses typically do not have any carbohydrates.     Expected Outcomes:  Demonstrated interest in learning. Expect positive outcomes  Education material provided: Carb Counting and Meal Planning [employer]); Carbohydrate Counting ([employer])  If problems or questions, patient to contact team via:  Phone  Future DSME appointment: -  prn

## 2019-09-02 NOTE — Patient Instructions (Signed)
   Try using Calorieking.com website to look up nutrition info on specific foods.   Can also search for restaurant websites that will provide nutrition information on menu items.   Protein foods such as eggs, meats, cheeses typically do not have any carbohydrates.

## 2019-09-14 ENCOUNTER — Encounter: Payer: Self-pay | Admitting: Dietician

## 2019-09-14 NOTE — Progress Notes (Signed)
Called pt for FU-pt reports BG's are doing better but still has some fluctuations. He does want to FU with dietitian again but he is out of town for the next wk and will call when he is ready to schedule another appointment

## 2019-10-08 ENCOUNTER — Encounter: Payer: Self-pay | Admitting: Dietician

## 2019-10-08 NOTE — Progress Notes (Signed)
Patient was planning to schedule another RD visit several weeks ago, but we have not yet heard back from him. Sent letter to referring provider.

## 2020-01-25 ENCOUNTER — Other Ambulatory Visit: Payer: Self-pay | Admitting: Surgery

## 2020-01-29 ENCOUNTER — Other Ambulatory Visit: Payer: Self-pay

## 2020-01-29 ENCOUNTER — Encounter
Admission: RE | Admit: 2020-01-29 | Discharge: 2020-01-29 | Disposition: A | Discharge status: Home / Self Care | Payer: Medicare Other | Source: Ambulatory Visit | Attending: Surgery | Admitting: Surgery

## 2020-01-29 NOTE — Patient Instructions (Addendum)
Your procedure is scheduled on: 02/09/20 Report to [facility]. To find out your arrival time please call [phone] between 1PM - 3PM on 02/08/20.  Remember: Instructions that are not followed completely may result in serious medical risk, up to and including death, or upon the discretion of your surgeon and anesthesiologist your surgery may need to be rescheduled.     _X__ 1. Do not eat food after midnight the night before your procedure.                 No gum chewing or hard candies. You may drink clear liquids up to 2 hours                 before you are scheduled to arrive for your surgery- DO not drink clear                 liquids within 2 hours of the start of your surgery.                 Clear Liquids include:  water, apple juice without pulp, clear carbohydrate                 drink such as Clearfast or Gatorade, Black Coffee or Tea (Do not add                 anything to coffee or tea). Diabetics water only  (G2 GATORADE 2 HOURS PRIOR TO ARRIVAL)  __X__2.  On the morning of surgery brush your teeth with toothpaste and water, you                 may rinse your mouth with mouthwash if you wish.  Do not swallow any              toothpaste of mouthwash.     _X__ 3.  No Alcohol for 24 hours before or after surgery.   _X__ 4.  Do Not Smoke or use e-cigarettes For 24 Hours Prior to Your Surgery.                 Do not use any chewable tobacco products for at least 6 hours prior to                 surgery.  ____  5.  Bring all medications with you on the day of surgery if instructed.   __X__  6.  Notify your doctor if there is any change in your medical condition      (cold, fever, infections).     Do not wear jewelry, make-up, hairpins, clips or nail polish. Do not wear lotions, powders, or perfumes.  Do not shave 48 hours prior to surgery. Men may shave face and neck. Do not bring valuables to the hospital.    [facility]  is not responsible for any belongings or valuables.  Contacts, dentures/partials or body piercings may not be worn into surgery. Bring a case for your contacts, glasses or hearing aids, a denture cup will be supplied. Leave your suitcase in the car. After surgery it may be brought to your room. For patients admitted to the hospital, discharge time is determined by your treatment team.   Patients discharged the day of surgery will not be allowed to drive home.   Please read over the following fact sheets that you were given:   MRSA Information  __X__ Take these medicines the morning of  surgery with A SIP OF WATER:      1. esomeprazole (NEXIUM) 40 MG capsule  2. fexofenadine (ALLEGRA) 180 MG tablet  3.   4.  5.  6.  ____ Fleet Enema (as directed)   __X__ Use CHG Soap/SAGE wipes as directed  ____ Use inhalers on the day of surgery  __X__ Stop metformin/Janumet/Farxiga 2 days prior to surgery    ____ Take 1/2 of usual insulin dose the night before surgery. No insulin the morning          of surgery.   __X__ Stop Blood Thinners Coumadin/Plavix/Xarelto/Pleta/Pradaxa/Eliquis/Effient/Aspirin  on (as instructed by your physician)  Or contact your Surgeon, Cardiologist or Medical Doctor regarding  ability to stop your blood thinners  __X__ Stop Anti-inflammatories 7 days before surgery such as Advil, Ibuprofen, Motrin,  BC or Goodies Powder, Naprosyn, Naproxen, Aleve, Aspirin    __X__ Stop all herbal supplements, fish oil or vitamin E until after surgery.    __X__ Bring C-Pap to the hospital.    REVIEW INCENTIVE SPIROMETER INSTRUCTIONS.  G2 GATORADE SHOULD BE FINISHED 2 HOURS PRIOR TO YOUR ARRIVAL   How to Use an Incentive Spirometer An incentive spirometer is a tool that measures how well you are filling your lungs with each breath. Learning to take long, deep breaths using this tool can help you keep your lungs clear and active. This may help to reverse or lessen your chance of  developing breathing (pulmonary) problems, especially infection. You may be asked to use a spirometer:  After a surgery.  If you have a lung problem or a history of smoking.  After a long period of time when you have been unable to move or be active. If the spirometer includes an indicator to show the highest number that you have reached, your health care provider or respiratory therapist will help you set a goal. Keep a list (log) of your progress as told by your health care provider. What are the risks?  Breathing too quickly may cause dizziness or cause you to pass out. Take your time so you do not get dizzy or light-headed.  If you are in pain, you may need to take pain medicine before doing incentive spirometry. It is harder to take a deep breath if you are having pain. How to use your incentive spirometer  1. Sit up on the edge of your bed or on a chair. 2. Hold the incentive spirometer so that it is in an upright position. 3. Before you use the spirometer, breathe out normally. 4. Place the mouthpiece in your mouth. Make sure your lips are closed tightly around it. 5. Breathe in slowly and as deeply as you can through your mouth, causing the piston or the ball to rise toward the top of the chamber. 6. Hold your breath for 3-5 seconds, or for as long as possible. ? If the spirometer includes a coach indicator, use this to guide you in breathing. Slow down your breathing if the indicator goes above the marked areas. 7. Remove the mouthpiece from your mouth and breathe out normally. The piston or ball will return to the bottom of the chamber. 8. Rest for a few seconds, then repeat the steps 10 or more times. ? Take your time and take a few normal breaths between deep breaths so that you do not get dizzy or light-headed. ? Do this every 1-2 hours when you are awake. 9. If the spirometer includes a goal marker to show the highest  number you have reached (best effort), use this as a goal  to work toward during each repetition. 10. After each set of 10 deep breaths, cough a few times. This will help to make sure that your lungs are clear. ? If you have an incision on your chest or abdomen from surgery, place a pillow or a rolled-up towel firmly against the incision when you cough. This can help to reduce pain from coughing. General tips  When you become able to get out of bed, walk around often and continue to cough to help clear your lungs.  Keep using the incentive spirometer until your health care provider says it is okay to stop using it. If you have been in the hospital, you may be told to keep using the spirometer at home. Contact a health care provider if:  You are having difficulty using the spirometer.  You have trouble using the spirometer as often as instructed.  Your pain medicine is not giving enough relief for you to use the spirometer as told.  You have a fever.  You develop shortness of breath. Get help right away if:  You develop a cough with bloody mucus from the lungs (bloody sputum).  You have fluid or blood coming from an incision site after you cough. Summary  An incentive spirometer is a tool that can help you learn to take long, deep breaths to keep your lungs clear and active.  You may be asked to use a spirometer after a surgery, if you have a lung problem or a history of smoking, or if you have been inactive for a long period of time.  Use your incentive spirometer as instructed every 1-2 hours while you are awake.  If you have an incision on your chest or abdomen, place a pillow or a rolled-up towel firmly against your incision when you cough. This will help to reduce pain. This information is not intended to replace advice given to you by your health care provider. Make sure you discuss any questions you have with your health care provider. Document Revised: 04/03/2019 Document Reviewed: 07/17/2017 Elsevier Patient Education  2020  [employer].

## 2020-02-01 ENCOUNTER — Encounter
Admission: RE | Admit: 2020-02-01 | Discharge: 2020-02-01 | Disposition: A | Discharge status: Home / Self Care | Payer: Medicare Other | Source: Ambulatory Visit | Attending: Surgery | Admitting: Surgery

## 2020-02-01 ENCOUNTER — Other Ambulatory Visit: Payer: Self-pay

## 2020-02-01 DIAGNOSIS — Z01818 Encounter for other preprocedural examination: Secondary | ICD-10-CM | POA: Insufficient documentation

## 2020-02-01 DIAGNOSIS — Z01810 Encounter for preprocedural cardiovascular examination: Secondary | ICD-10-CM | POA: Diagnosis not present

## 2020-02-01 LAB — CBC WITH DIFFERENTIAL/PLATELET
Abs Immature Granulocytes: 0.04 10*3/uL (ref 0.00–0.07)
Basophils Absolute: 0 10*3/uL (ref 0.0–0.1)
Basophils Relative: 0 %
Eosinophils Absolute: 0.2 10*3/uL (ref 0.0–0.5)
Eosinophils Relative: 2 %
HCT: 37.8 % — ABNORMAL LOW (ref 39.0–52.0)
Hemoglobin: 12 g/dL — ABNORMAL LOW (ref 13.0–17.0)
Immature Granulocytes: 1 %
Lymphocytes Relative: 11 %
Lymphs Abs: 0.9 10*3/uL (ref 0.7–4.0)
MCH: 29 pg (ref 26.0–34.0)
MCHC: 31.7 g/dL (ref 30.0–36.0)
MCV: 91.3 fL (ref 80.0–100.0)
Monocytes Absolute: 0.5 10*3/uL (ref 0.1–1.0)
Monocytes Relative: 6 %
Neutro Abs: 7.1 10*3/uL (ref 1.7–7.7)
Neutrophils Relative %: 80 %
Platelets: 191 10*3/uL (ref 150–400)
RBC: 4.14 MIL/uL — ABNORMAL LOW (ref 4.22–5.81)
RDW: 15.7 % — ABNORMAL HIGH (ref 11.5–15.5)
WBC: 8.8 10*3/uL (ref 4.0–10.5)
nRBC: 0 % (ref 0.0–0.2)

## 2020-02-01 LAB — URINALYSIS, ROUTINE W REFLEX MICROSCOPIC
Bacteria, UA: NONE SEEN
Bilirubin Urine: NEGATIVE
Glucose, UA: >=500 mg/dL — AB
Hgb urine dipstick: NEGATIVE
Ketones, ur: NEGATIVE mg/dL
Leukocytes,Ua: NEGATIVE
Nitrite: NEGATIVE
Protein, ur: 30 mg/dL — AB
Specific Gravity, Urine: 1.016 (ref 1.005–1.030)
Squamous Epithelial / HPF: NONE SEEN (ref 0–5)
pH: 6 (ref 5.0–8.0)

## 2020-02-01 LAB — TYPE AND SCREEN
ABO/RH(D): A NEG
Antibody Screen: NEGATIVE

## 2020-02-01 LAB — COMPREHENSIVE METABOLIC PANEL
ALT: 24 U/L (ref 0–44)
AST: 22 U/L (ref 15–41)
Albumin: 3.6 g/dL (ref 3.5–5.0)
Alkaline Phosphatase: 67 U/L (ref 38–126)
Anion gap: 9 (ref 5–15)
BUN: 25 mg/dL — ABNORMAL HIGH (ref 8–23)
CO2: 24 mmol/L (ref 22–32)
Calcium: 8.4 mg/dL — ABNORMAL LOW (ref 8.9–10.3)
Chloride: 103 mmol/L (ref 98–111)
Creatinine, Ser: 1.38 mg/dL — ABNORMAL HIGH (ref 0.61–1.24)
GFR calc Af Amer: 57 mL/min — ABNORMAL LOW (ref >60)
GFR calc non Af Amer: 49 mL/min — ABNORMAL LOW (ref >60)
Glucose, Bld: 298 mg/dL — ABNORMAL HIGH (ref 70–99)
Potassium: 4.6 mmol/L (ref 3.5–5.1)
Sodium: 136 mmol/L (ref 135–145)
Total Bilirubin: 0.8 mg/dL (ref 0.3–1.2)
Total Protein: 6.6 g/dL (ref 6.5–8.1)

## 2020-02-01 LAB — SURGICAL PCR SCREEN
MRSA, PCR: NEGATIVE
Staphylococcus aureus: NEGATIVE

## 2020-02-01 NOTE — Pre-Procedure Instructions (Signed)
Pre-Admit Testing Provider Notification Note  Provider Notified: Poggi  Notification Mode: Fax  Reason: Abnormal lab result.  Response: Fax confirmation received.   Additional Information: Placed on chart. Noted on Pre-Admit Worksheet.  Signed: Beulah Gandy, RN

## 2020-02-05 ENCOUNTER — Other Ambulatory Visit
Admission: RE | Admit: 2020-02-05 | Discharge: 2020-02-05 | Disposition: A | Discharge status: Home / Self Care | Payer: Medicare Other | Source: Ambulatory Visit | Attending: Surgery | Admitting: Surgery

## 2020-02-05 DIAGNOSIS — Z01812 Encounter for preprocedural laboratory examination: Secondary | ICD-10-CM | POA: Diagnosis present

## 2020-02-05 DIAGNOSIS — Z20822 Contact with and (suspected) exposure to covid-19: Secondary | ICD-10-CM | POA: Insufficient documentation

## 2020-02-05 LAB — SARS CORONAVIRUS 2 (TAT 6-24 HRS): SARS Coronavirus 2: NEGATIVE

## 2020-02-09 ENCOUNTER — Encounter
Admission: RE | Disposition: A | Discharge status: Skilled Nursing Facility | Payer: Self-pay | Source: Home / Self Care | Attending: Surgery

## 2020-02-09 ENCOUNTER — Encounter: Payer: Self-pay | Admitting: Surgery

## 2020-02-09 ENCOUNTER — Inpatient Hospital Stay: Payer: Medicare Other | Admitting: Anesthesiology

## 2020-02-09 ENCOUNTER — Inpatient Hospital Stay: Payer: Medicare Other

## 2020-02-09 ENCOUNTER — Inpatient Hospital Stay
Admission: RE | Admit: 2020-02-09 | Discharge: 2020-02-12 | DRG: 470 | Disposition: A | Discharge status: Skilled Nursing Facility | Payer: Medicare Other | Attending: Surgery | Admitting: Surgery

## 2020-02-09 ENCOUNTER — Other Ambulatory Visit: Payer: Self-pay

## 2020-02-09 DIAGNOSIS — Z20822 Contact with and (suspected) exposure to covid-19: Secondary | ICD-10-CM | POA: Diagnosis present

## 2020-02-09 DIAGNOSIS — Z86718 Personal history of other venous thrombosis and embolism: Secondary | ICD-10-CM | POA: Diagnosis not present

## 2020-02-09 DIAGNOSIS — Z955 Presence of coronary angioplasty implant and graft: Secondary | ICD-10-CM | POA: Diagnosis not present

## 2020-02-09 DIAGNOSIS — E7800 Pure hypercholesterolemia, unspecified: Secondary | ICD-10-CM | POA: Diagnosis present

## 2020-02-09 DIAGNOSIS — I10 Essential (primary) hypertension: Secondary | ICD-10-CM | POA: Diagnosis present

## 2020-02-09 DIAGNOSIS — Z79899 Other long term (current) drug therapy: Secondary | ICD-10-CM

## 2020-02-09 DIAGNOSIS — G4733 Obstructive sleep apnea (adult) (pediatric): Secondary | ICD-10-CM | POA: Diagnosis present

## 2020-02-09 DIAGNOSIS — K219 Gastro-esophageal reflux disease without esophagitis: Secondary | ICD-10-CM | POA: Diagnosis present

## 2020-02-09 DIAGNOSIS — Z820 Family history of epilepsy and other diseases of the nervous system: Secondary | ICD-10-CM

## 2020-02-09 DIAGNOSIS — Z89412 Acquired absence of left great toe: Secondary | ICD-10-CM

## 2020-02-09 DIAGNOSIS — E7849 Other hyperlipidemia: Secondary | ICD-10-CM | POA: Diagnosis present

## 2020-02-09 DIAGNOSIS — E10319 Type 1 diabetes mellitus with unspecified diabetic retinopathy without macular edema: Secondary | ICD-10-CM | POA: Diagnosis present

## 2020-02-09 DIAGNOSIS — Z96651 Presence of right artificial knee joint: Secondary | ICD-10-CM | POA: Diagnosis present

## 2020-02-09 DIAGNOSIS — Z89422 Acquired absence of other left toe(s): Secondary | ICD-10-CM

## 2020-02-09 DIAGNOSIS — Z85820 Personal history of malignant melanoma of skin: Secondary | ICD-10-CM | POA: Diagnosis not present

## 2020-02-09 DIAGNOSIS — E1069 Type 1 diabetes mellitus with other specified complication: Secondary | ICD-10-CM | POA: Diagnosis present

## 2020-02-09 DIAGNOSIS — Z794 Long term (current) use of insulin: Secondary | ICD-10-CM

## 2020-02-09 DIAGNOSIS — I2510 Atherosclerotic heart disease of native coronary artery without angina pectoris: Secondary | ICD-10-CM | POA: Diagnosis present

## 2020-02-09 DIAGNOSIS — M1712 Unilateral primary osteoarthritis, left knee: Secondary | ICD-10-CM | POA: Diagnosis present

## 2020-02-09 DIAGNOSIS — Z803 Family history of malignant neoplasm of breast: Secondary | ICD-10-CM | POA: Diagnosis not present

## 2020-02-09 DIAGNOSIS — Z7901 Long term (current) use of anticoagulants: Secondary | ICD-10-CM | POA: Diagnosis not present

## 2020-02-09 DIAGNOSIS — I25810 Atherosclerosis of coronary artery bypass graft(s) without angina pectoris: Secondary | ICD-10-CM | POA: Diagnosis present

## 2020-02-09 DIAGNOSIS — I252 Old myocardial infarction: Secondary | ICD-10-CM | POA: Diagnosis not present

## 2020-02-09 DIAGNOSIS — E1042 Type 1 diabetes mellitus with diabetic polyneuropathy: Secondary | ICD-10-CM | POA: Diagnosis present

## 2020-02-09 DIAGNOSIS — Z96652 Presence of left artificial knee joint: Secondary | ICD-10-CM

## 2020-02-09 HISTORY — PX: PARTIAL KNEE ARTHROPLASTY: SHX2174

## 2020-02-09 LAB — PROTIME-INR
INR: 1.6 — ABNORMAL HIGH (ref 0.8–1.2)
Prothrombin Time: 18.7 seconds — ABNORMAL HIGH (ref 11.4–15.2)

## 2020-02-09 LAB — GLUCOSE, CAPILLARY
Glucose-Capillary: 108 mg/dL — ABNORMAL HIGH (ref 70–99)
Glucose-Capillary: 89 mg/dL (ref 70–99)
Glucose-Capillary: 230 mg/dL — ABNORMAL HIGH (ref 70–99)
Glucose-Capillary: 143 mg/dL — ABNORMAL HIGH (ref 70–99)

## 2020-02-09 SURGERY — ARTHROPLASTY, KNEE, UNICOMPARTMENTAL
Anesthesia: General | Site: Knee | Laterality: Left

## 2020-02-09 MED ORDER — TRANEXAMIC ACID-NACL 1000-0.7 MG/100ML-% IV SOLN: 1000.0000 mg | INTRAVENOUS | Status: AC

## 2020-02-09 MED ORDER — DIPHENHYDRAMINE HCL 12.5 MG/5ML PO ELIX: 12.5000 mg | ORAL_SOLUTION | ORAL | Status: DC | PRN

## 2020-02-09 MED ORDER — TRAMADOL HCL 50 MG PO TABS: 50.0000 mg | ORAL_TABLET | Freq: Four times a day (QID) | ORAL | Status: DC | PRN

## 2020-02-09 MED ORDER — ACETAMINOPHEN 500 MG PO TABS
1000.0000 mg | ORAL_TABLET | Freq: Four times a day (QID) | ORAL | Status: AC
  Administered 2020-02-09 (×2): 1000 mg via ORAL
  Filled 2020-02-09 (×2): qty 2

## 2020-02-09 MED ORDER — PROMETHAZINE HCL 25 MG/ML IJ SOLN: 6.2500 mg | INTRAMUSCULAR | Status: DC | PRN

## 2020-02-09 MED ORDER — DOCUSATE SODIUM 100 MG PO CAPS
100.0000 mg | ORAL_CAPSULE | ORAL | Status: DC
  Filled 2020-02-09: qty 1

## 2020-02-09 MED ORDER — ONDANSETRON HCL 4 MG PO TABS: 4.0000 mg | ORAL_TABLET | Freq: Four times a day (QID) | ORAL | Status: DC | PRN

## 2020-02-09 MED ORDER — SODIUM CHLORIDE 0.9 % IV SOLN: INTRAVENOUS | Status: DC

## 2020-02-09 MED ORDER — SUCCINYLCHOLINE CHLORIDE 200 MG/10ML IV SOSY
PREFILLED_SYRINGE | INTRAVENOUS | Status: AC
  Filled 2020-02-09: qty 10

## 2020-02-09 MED ORDER — ATORVASTATIN CALCIUM 20 MG PO TABS
80.0000 mg | ORAL_TABLET | Freq: Every evening | ORAL | Status: DC
  Administered 2020-02-09 – 2020-02-11 (×3): 80 mg via ORAL
  Filled 2020-02-09 (×3): qty 4

## 2020-02-09 MED ORDER — WARFARIN - PHYSICIAN DOSING INPATIENT: Freq: Every day | Status: DC

## 2020-02-09 MED ORDER — BUPIVACAINE LIPOSOME 1.3 % IJ SUSP
INTRAMUSCULAR | Status: AC
  Filled 2020-02-09: qty 20

## 2020-02-09 MED ORDER — INSULIN LISPRO 100 UNIT/ML CARTRIDGE
150.0000 [IU] | SUBCUTANEOUS | Status: DC
  Filled 2020-02-09: qty 3

## 2020-02-09 MED ORDER — SODIUM CHLORIDE FLUSH 0.9 % IV SOLN
INTRAVENOUS | Status: AC
  Filled 2020-02-09: qty 40

## 2020-02-09 MED ORDER — FENTANYL CITRATE (PF) 100 MCG/2ML IJ SOLN
INTRAMUSCULAR | Status: DC | PRN
  Administered 2020-02-09 (×2): 50 ug via INTRAVENOUS

## 2020-02-09 MED ORDER — CEFAZOLIN SODIUM-DEXTROSE 2-4 GM/100ML-% IV SOLN
2.0000 g | Freq: Four times a day (QID) | INTRAVENOUS | Status: AC
  Administered 2020-02-09 – 2020-02-10 (×2): 2 g via INTRAVENOUS
  Filled 2020-02-09 (×3): qty 100

## 2020-02-09 MED ORDER — METOCLOPRAMIDE HCL 10 MG PO TABS: 5.0000 mg | ORAL_TABLET | Freq: Three times a day (TID) | ORAL | Status: DC | PRN

## 2020-02-09 MED ORDER — METFORMIN HCL 500 MG PO TABS
500.0000 mg | ORAL_TABLET | Freq: Two times a day (BID) | ORAL | Status: DC
  Administered 2020-02-09 – 2020-02-12 (×6): 500 mg via ORAL
  Filled 2020-02-09 (×6): qty 1

## 2020-02-09 MED ORDER — OXYCODONE HCL 5 MG PO TABS
5.0000 mg | ORAL_TABLET | ORAL | Status: DC | PRN
  Administered 2020-02-09 – 2020-02-11 (×5): 5 mg via ORAL
  Filled 2020-02-09 (×4): qty 1
  Filled 2020-02-09: qty 2

## 2020-02-09 MED ORDER — MAGNESIUM HYDROXIDE 400 MG/5ML PO SUSP
30.0000 mL | Freq: Every day | ORAL | Status: DC | PRN
  Administered 2020-02-11 – 2020-02-12 (×2): 30 mL via ORAL
  Filled 2020-02-09 (×2): qty 30

## 2020-02-09 MED ORDER — ROCURONIUM BROMIDE 10 MG/ML (PF) SYRINGE
PREFILLED_SYRINGE | INTRAVENOUS | Status: AC
  Filled 2020-02-09: qty 10

## 2020-02-09 MED ORDER — METOCLOPRAMIDE HCL 5 MG/ML IJ SOLN: 5.0000 mg | Freq: Three times a day (TID) | INTRAMUSCULAR | Status: DC | PRN

## 2020-02-09 MED ORDER — MEPERIDINE HCL 50 MG/ML IJ SOLN: 6.2500 mg | INTRAMUSCULAR | Status: DC | PRN

## 2020-02-09 MED ORDER — OXYCODONE HCL 5 MG/5ML PO SOLN: 5.0000 mg | Freq: Once | ORAL | Status: DC | PRN

## 2020-02-09 MED ORDER — LORATADINE 10 MG PO TABS
10.0000 mg | ORAL_TABLET | Freq: Every day | ORAL | Status: DC
  Administered 2020-02-10 – 2020-02-12 (×3): 10 mg via ORAL
  Filled 2020-02-09 (×3): qty 1

## 2020-02-09 MED ORDER — FENTANYL CITRATE (PF) 100 MCG/2ML IJ SOLN
INTRAMUSCULAR | Status: AC
  Filled 2020-02-09: qty 2

## 2020-02-09 MED ORDER — PROPOFOL 10 MG/ML IV BOLUS
INTRAVENOUS | Status: DC | PRN
  Administered 2020-02-09: 150 mg via INTRAVENOUS

## 2020-02-09 MED ORDER — SODIUM CHLORIDE 0.9 % IV SOLN
INTRAVENOUS | Status: DC | PRN
  Administered 2020-02-09: 40 mL

## 2020-02-09 MED ORDER — PHENYLEPHRINE HCL (PRESSORS) 10 MG/ML IV SOLN
INTRAVENOUS | Status: AC
  Filled 2020-02-09: qty 1

## 2020-02-09 MED ORDER — INSULIN PUMP
SUBCUTANEOUS | Status: DC
  Filled 2020-02-09: qty 1

## 2020-02-09 MED ORDER — DEXAMETHASONE SODIUM PHOSPHATE 10 MG/ML IJ SOLN
INTRAMUSCULAR | Status: DC | PRN
  Administered 2020-02-09: 5 mg via INTRAVENOUS

## 2020-02-09 MED ORDER — OXYCODONE HCL 5 MG PO TABS: 5.0000 mg | ORAL_TABLET | Freq: Once | ORAL | Status: DC | PRN

## 2020-02-09 MED ORDER — DOCUSATE SODIUM 100 MG PO CAPS
100.0000 mg | ORAL_CAPSULE | Freq: Two times a day (BID) | ORAL | Status: DC
  Administered 2020-02-09 – 2020-02-12 (×6): 100 mg via ORAL
  Filled 2020-02-09 (×5): qty 1

## 2020-02-09 MED ORDER — ROCURONIUM BROMIDE 100 MG/10ML IV SOLN
INTRAVENOUS | Status: DC | PRN
  Administered 2020-02-09: 20 mg via INTRAVENOUS
  Administered 2020-02-09: 50 mg via INTRAVENOUS

## 2020-02-09 MED ORDER — BUPIVACAINE-EPINEPHRINE (PF) 0.5% -1:200000 IJ SOLN
INTRAMUSCULAR | Status: AC
  Filled 2020-02-09: qty 90

## 2020-02-09 MED ORDER — TRANEXAMIC ACID-NACL 1000-0.7 MG/100ML-% IV SOLN
INTRAVENOUS | Status: AC
  Administered 2020-02-09: 1000 mg via INTRAVENOUS
  Filled 2020-02-09: qty 100

## 2020-02-09 MED ORDER — ACETAMINOPHEN 325 MG PO TABS: 325.0000 mg | ORAL_TABLET | Freq: Four times a day (QID) | ORAL | Status: DC | PRN

## 2020-02-09 MED ORDER — DEXAMETHASONE SODIUM PHOSPHATE 10 MG/ML IJ SOLN
INTRAMUSCULAR | Status: AC
  Filled 2020-02-09: qty 1

## 2020-02-09 MED ORDER — TRANEXAMIC ACID 1000 MG/10ML IV SOLN
INTRAVENOUS | Status: DC | PRN
  Administered 2020-02-09: 1000 mg via TOPICAL

## 2020-02-09 MED ORDER — ACETAMINOPHEN 10 MG/ML IV SOLN
INTRAVENOUS | Status: DC | PRN
  Administered 2020-02-09: 1000 mg via INTRAVENOUS

## 2020-02-09 MED ORDER — ADULT MULTIVITAMIN W/MINERALS CH
1.0000 | ORAL_TABLET | Freq: Every day | ORAL | Status: DC
  Administered 2020-02-10 – 2020-02-12 (×3): 1 via ORAL
  Filled 2020-02-09 (×3): qty 1

## 2020-02-09 MED ORDER — FLEET ENEMA 7-19 GM/118ML RE ENEM: 1.0000 | ENEMA | Freq: Once | RECTAL | Status: DC | PRN

## 2020-02-09 MED ORDER — INSULIN PUMP
Freq: Three times a day (TID) | SUBCUTANEOUS | Status: DC
  Administered 2020-02-09: 8.6 via SUBCUTANEOUS
  Filled 2020-02-09: qty 1

## 2020-02-09 MED ORDER — PANTOPRAZOLE SODIUM 40 MG PO TBEC
40.0000 mg | DELAYED_RELEASE_TABLET | Freq: Every day | ORAL | Status: DC
  Administered 2020-02-10 – 2020-02-12 (×3): 40 mg via ORAL
  Filled 2020-02-09 (×3): qty 1

## 2020-02-09 MED ORDER — ACETAMINOPHEN 10 MG/ML IV SOLN
INTRAVENOUS | Status: AC
  Filled 2020-02-09: qty 100

## 2020-02-09 MED ORDER — FENTANYL CITRATE (PF) 100 MCG/2ML IJ SOLN
INTRAMUSCULAR | Status: AC
  Administered 2020-02-09: 25 ug via INTRAVENOUS
  Filled 2020-02-09: qty 2

## 2020-02-09 MED ORDER — RAMIPRIL 2.5 MG PO CAPS
2.5000 mg | ORAL_CAPSULE | Freq: Every evening | ORAL | Status: DC
  Administered 2020-02-09 – 2020-02-11 (×3): 2.5 mg via ORAL
  Filled 2020-02-09 (×4): qty 1

## 2020-02-09 MED ORDER — ONDANSETRON HCL 4 MG/2ML IJ SOLN: 4.0000 mg | Freq: Four times a day (QID) | INTRAMUSCULAR | Status: DC | PRN

## 2020-02-09 MED ORDER — CEFAZOLIN SODIUM-DEXTROSE 2-4 GM/100ML-% IV SOLN
2.0000 g | INTRAVENOUS | Status: AC
  Administered 2020-02-09: 2 g via INTRAVENOUS

## 2020-02-09 MED ORDER — WARFARIN SODIUM 5 MG PO TABS
5.0000 mg | ORAL_TABLET | Freq: Once | ORAL | Status: AC
  Administered 2020-02-09: 5 mg via ORAL
  Filled 2020-02-09: qty 1

## 2020-02-09 MED ORDER — ONDANSETRON HCL 4 MG/2ML IJ SOLN
INTRAMUSCULAR | Status: DC | PRN
  Administered 2020-02-09: 4 mg via INTRAVENOUS

## 2020-02-09 MED ORDER — TRANEXAMIC ACID 1000 MG/10ML IV SOLN
INTRAVENOUS | Status: AC
  Filled 2020-02-09: qty 10

## 2020-02-09 MED ORDER — LACTATED RINGERS IV SOLN: INTRAVENOUS | Status: DC | PRN

## 2020-02-09 MED ORDER — HYDROMORPHONE HCL 1 MG/ML IJ SOLN: 0.2500 mg | INTRAMUSCULAR | Status: DC | PRN

## 2020-02-09 MED ORDER — LIDOCAINE HCL (CARDIAC) PF 100 MG/5ML IV SOSY
PREFILLED_SYRINGE | INTRAVENOUS | Status: DC | PRN
  Administered 2020-02-09: 100 mg via INTRAVENOUS

## 2020-02-09 MED ORDER — BUPIVACAINE-EPINEPHRINE (PF) 0.5% -1:200000 IJ SOLN
INTRAMUSCULAR | Status: DC | PRN
  Administered 2020-02-09: 30 mL

## 2020-02-09 MED ORDER — LIDOCAINE HCL (PF) 2 % IJ SOLN
INTRAMUSCULAR | Status: AC
  Filled 2020-02-09: qty 5

## 2020-02-09 MED ORDER — PHENYLEPHRINE HCL (PRESSORS) 10 MG/ML IV SOLN
INTRAVENOUS | Status: DC | PRN
  Administered 2020-02-09 (×2): 100 ug via INTRAVENOUS
  Administered 2020-02-09: 50 ug via INTRAVENOUS
  Administered 2020-02-09 (×5): 100 ug via INTRAVENOUS

## 2020-02-09 MED ORDER — BUPIVACAINE LIPOSOME 1.3 % IJ SUSP: INTRAMUSCULAR | Status: DC | PRN

## 2020-02-09 MED ORDER — ONDANSETRON HCL 4 MG/2ML IJ SOLN
INTRAMUSCULAR | Status: AC
  Filled 2020-02-09: qty 2

## 2020-02-09 MED ORDER — BISACODYL 10 MG RE SUPP
10.0000 mg | Freq: Every day | RECTAL | Status: DC | PRN
  Filled 2020-02-09: qty 1

## 2020-02-09 MED ORDER — SUGAMMADEX SODIUM 200 MG/2ML IV SOLN
INTRAVENOUS | Status: DC | PRN
  Administered 2020-02-09: 100 mg via INTRAVENOUS
  Administered 2020-02-09: 200 mg via INTRAVENOUS

## 2020-02-09 MED ORDER — CEFAZOLIN SODIUM-DEXTROSE 2-4 GM/100ML-% IV SOLN
INTRAVENOUS | Status: AC
  Filled 2020-02-09: qty 100

## 2020-02-09 MED ORDER — FENTANYL CITRATE (PF) 100 MCG/2ML IJ SOLN
25.0000 ug | INTRAMUSCULAR | Status: DC | PRN
  Administered 2020-02-09: 25 ug via INTRAVENOUS

## 2020-02-09 MED ORDER — PROPOFOL 500 MG/50ML IV EMUL
INTRAVENOUS | Status: AC
  Filled 2020-02-09: qty 50

## 2020-02-09 SURGICAL SUPPLY — 66 items
BEARING MENISCAL TIBIAL 7 MD L (Knees) ×2 IMPLANT
BNDG ELASTIC 6X5.8 VLCR STR LF (GAUZE/BANDAGES/DRESSINGS) ×3 IMPLANT
CANISTER SUCT 1200ML W/VALVE (MISCELLANEOUS) ×3 IMPLANT
CANISTER SUCT 3000ML PPV (MISCELLANEOUS) ×3 IMPLANT
CEMENT BONE R 1X40 (Cement) ×3 IMPLANT
CEMENT VACUUM MIXING SYSTEM (MISCELLANEOUS) ×3 IMPLANT
CHLORAPREP W/TINT 26 (MISCELLANEOUS) ×6 IMPLANT
COOLER POLAR GLACIER W/PUMP (MISCELLANEOUS) ×1 IMPLANT
COVER MAYO STAND REUSABLE (DRAPES) ×3 IMPLANT
COVER WAND RF STERILE (DRAPES) ×3 IMPLANT
CUFF TOURN SGL QUICK 24 (TOURNIQUET CUFF)
CUFF TOURN SGL QUICK 30 (TOURNIQUET CUFF) ×2
CUFF TRNQT CYL 24X4X16.5-23 (TOURNIQUET CUFF) IMPLANT
CUFF TRNQT CYL 30X4X21-28X (TOURNIQUET CUFF) IMPLANT
DRAPE C-ARM XRAY 36X54 (DRAPES) IMPLANT
DRSG OPSITE POSTOP 4X12 (GAUZE/BANDAGES/DRESSINGS) ×3 IMPLANT
DRSG OPSITE POSTOP 4X14 (GAUZE/BANDAGES/DRESSINGS) ×3 IMPLANT
DRSG OPSITE POSTOP 4X6 (GAUZE/BANDAGES/DRESSINGS) ×1 IMPLANT
DRSG OPSITE POSTOP 4X8 (GAUZE/BANDAGES/DRESSINGS) ×2 IMPLANT
ELECT CAUTERY BLADE 6.4 (BLADE) ×3 IMPLANT
ELECT REM PT RETURN 9FT ADLT (ELECTROSURGICAL) ×3
ELECTRODE REM PT RTRN 9FT ADLT (ELECTROSURGICAL) ×1 IMPLANT
GAUZE 4X4 16PLY RFD (DISPOSABLE) ×3 IMPLANT
GAUZE SPONGE 4X4 12PLY STRL (GAUZE/BANDAGES/DRESSINGS) ×3 IMPLANT
GAUZE XEROFORM 1X8 LF (GAUZE/BANDAGES/DRESSINGS) ×3 IMPLANT
GLOVE BIO SURGEON STRL SZ7.5 (GLOVE) ×12 IMPLANT
GLOVE BIO SURGEON STRL SZ8 (GLOVE) ×12 IMPLANT
GLOVE BIOGEL PI IND STRL 8 (GLOVE) ×1 IMPLANT
GLOVE BIOGEL PI INDICATOR 8 (GLOVE) ×2
GLOVE INDICATOR 8.0 STRL GRN (GLOVE) ×3 IMPLANT
GOWN STRL REUS W/ TWL LRG LVL3 (GOWN DISPOSABLE) ×1 IMPLANT
GOWN STRL REUS W/ TWL XL LVL3 (GOWN DISPOSABLE) ×1 IMPLANT
GOWN STRL REUS W/TWL LRG LVL3 (GOWN DISPOSABLE) ×2
GOWN STRL REUS W/TWL XL LVL3 (GOWN DISPOSABLE) ×2
HOLDER FOLEY CATH W/STRAP (MISCELLANEOUS) ×1 IMPLANT
HOOD PEEL AWAY FLYTE STAYCOOL (MISCELLANEOUS) ×9 IMPLANT
KIT TURNOVER KIT A (KITS) ×3 IMPLANT
MAT ABSORB  FLUID 56X50 GRAY (MISCELLANEOUS) ×2
MAT ABSORB FLUID 56X50 GRAY (MISCELLANEOUS) ×1 IMPLANT
NDL SAFETY ECLIPSE 18X1.5 (NEEDLE) ×1 IMPLANT
NDL SPNL 20GX3.5 QUINCKE YW (NEEDLE) ×1 IMPLANT
NEEDLE HYPO 18GX1.5 SHARP (NEEDLE) ×2
NEEDLE SPNL 20GX3.5 QUINCKE YW (NEEDLE) ×3 IMPLANT
NS IRRIG 1000ML POUR BTL (IV SOLUTION) ×3 IMPLANT
PACK BLADE SAW RECIP 70 3 PT (BLADE) ×2 IMPLANT
PACK TOTAL KNEE (MISCELLANEOUS) ×3 IMPLANT
PAD WRAPON POLAR KNEE (MISCELLANEOUS) ×1 IMPLANT
PEG TWIN FEM CEMENTED MED (Knees) ×2 IMPLANT
PULSAVAC PLUS IRRIG FAN TIP (DISPOSABLE) ×3
SOL .9 NS 3000ML IRR  AL (IV SOLUTION) ×2
SOL .9 NS 3000ML IRR UROMATIC (IV SOLUTION) ×1 IMPLANT
STAPLER SKIN PROX 35W (STAPLE) ×3 IMPLANT
STRAP SAFETY 5IN WIDE (MISCELLANEOUS) ×3 IMPLANT
SUCTION FRAZIER HANDLE 10FR (MISCELLANEOUS) ×2
SUCTION TUBE FRAZIER 10FR DISP (MISCELLANEOUS) ×1 IMPLANT
SUT VIC AB 0 CT1 36 (SUTURE) ×3 IMPLANT
SUT VIC AB 2-0 CT1 27 (SUTURE) ×8
SUT VIC AB 2-0 CT1 TAPERPNT 27 (SUTURE) ×4 IMPLANT
SYR 10ML LL (SYRINGE) ×3 IMPLANT
SYR 20ML LL LF (SYRINGE) ×3 IMPLANT
SYR 30ML LL (SYRINGE) ×9 IMPLANT
TAPE TRANSPORE STRL 2 31045 (GAUZE/BANDAGES/DRESSINGS) ×3 IMPLANT
TIP FAN IRRIG PULSAVAC PLUS (DISPOSABLE) ×1 IMPLANT
TRAY FOLEY MTR SLVR 16FR STAT (SET/KITS/TRAYS/PACK) ×1 IMPLANT
TRAY TIBIAL OXFORD SZ D LF (Joint) ×2 IMPLANT
WRAPON POLAR PAD KNEE (MISCELLANEOUS)

## 2020-02-09 NOTE — Progress Notes (Signed)
Pt has a home CPAP unit at bedside but does not want to wear tonight.

## 2020-02-09 NOTE — Evaluation (Signed)
Physical Therapy Evaluation Patient Details Name: Carl Yang MRN: [id number] DOB: May 22, 1941 Today's Date: 02/09/2020   History of Present Illness  Pt admitted for L partial knee. 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 Other PMH includes anemia, GERD, DVT, and HLD.  Clinical Impression  Pt is a pleasant 79 year old male who was admitted for L uni-knee. Pt is POD 0 at time of evaluation. Pt performs bed mobility, transfers, and ambulation with supervision and RW. Pt demonstrates ability to perform 10 SLRs with independence, therefore does not require KI for mobility. Pt demonstrates deficits with strength/mobility. Currently describes no pain. Chronic L LE numbness present, doesn't appear to affect mobility efforts.  Would benefit from skilled PT to address above deficits and promote optimal return to PLOF. Recommend transition to HHPT upon discharge from acute hospitalization.     Follow Up Recommendations Home health PT    Equipment Recommendations  None recommended by PT    Recommendations for Other Services       Precautions / Restrictions Precautions Precautions: Fall;Knee Precaution Booklet Issued: No Restrictions Weight Bearing Restrictions: Yes LLE Weight Bearing: Weight bearing as tolerated      Mobility  Bed Mobility Overal bed mobility: Needs Assistance Bed Mobility: Supine to Sit     Supine to sit: Supervision     General bed mobility comments: safe technique with cues for initiation. UPright posture noted  Transfers Overall transfer level: Needs assistance Equipment used: Rolling walker (2 wheeled) Transfers: Sit to/from Stand Sit to Stand: Supervision         General transfer comment: elevated bed slightly for comfort. Upright posture. Equal WBing noted  Ambulation/Gait Ambulation/Gait assistance: Supervision Gait Distance (Feet): 40 Feet Assistive device: Rolling walker (2 wheeled) Gait Pattern/deviations: Step-through  pattern     General Gait Details: ambulated with reciprocal gait pattern and safe technique. Able to avoid obstacles in room and demonstrate upright posture. Follows commands well and reports no pain  Stairs            Wheelchair Mobility    Modified Rankin (Stroke Patients Only)       Balance Overall balance assessment: Needs assistance Sitting-balance support: Feet supported Sitting balance-Leahy Scale: Good     Standing balance support: Bilateral upper extremity supported Standing balance-Leahy Scale: Good                               Pertinent Vitals/Pain Pain Assessment: No/denies pain    Home Living Family/patient expects to be discharged to:: Private residence Living Arrangements: Alone(has finace near by) Available Help at Discharge: Friend(s);Available PRN/intermittently Type of Home: Independent living facility Home Access: Level entry     Home Layout: One level Home Equipment: Walker - 2 wheels;Bedside commode;Cane - single point Additional Comments: Patient lives in independent living at [facility].    Prior Function Level of Independence: Independent         Comments: Ind amb without AD community distances, no fall history, Ind with ADLs     Hand Dominance        Extremity/Trunk Assessment   Upper Extremity Assessment Upper Extremity Assessment: Overall WFL for tasks assessed    Lower Extremity Assessment Lower Extremity Assessment: Generalized weakness(L LE grossly 3+/5)       Communication   Communication: No difficulties  Cognition Arousal/Alertness: Awake/alert Behavior During Therapy: WFL for tasks assessed/performed Overall Cognitive Status: Within Functional Limits for tasks assessed  General Comments      Exercises Total Joint Exercises Goniometric ROM: L knee AAROM: 1-120 degrees Other Exercises Other Exercises: supine ther-ex performed on L LE  including AP, quad sets, SLRs, and hip abd/add x 10 reps with cga   Assessment/Plan    PT Assessment Patient needs continued PT services  PT Problem List Decreased strength;Decreased activity tolerance;Decreased balance;Decreased mobility;Decreased cognition       PT Treatment Interventions DME instruction;Gait training;Therapeutic exercise    PT Goals (Current goals can be found in the Care Plan section)  Acute Rehab PT Goals Patient Stated Goal: to go home PT Goal Formulation: With patient Time For Goal Achievement: 02/23/20 Potential to Achieve Goals: Good    Frequency BID   Barriers to discharge        Co-evaluation               AM-PAC PT "6 Clicks" Mobility  Outcome Measure Help needed turning from your back to your side while in a flat bed without using bedrails?: None Help needed moving from lying on your back to sitting on the side of a flat bed without using bedrails?: None Help needed moving to and from a bed to a chair (including a wheelchair)?: A Little Help needed standing up from a chair using your arms (e.g., wheelchair or bedside chair)?: A Little Help needed to walk in hospital room?: A Little Help needed climbing 3-5 steps with a railing? : A Little 6 Click Score: 20    End of Session Equipment Utilized During Treatment: Gait belt Activity Tolerance: Patient tolerated treatment well Patient left: in chair;with chair alarm set;with SCD's reapplied Nurse Communication: Mobility status PT Visit Diagnosis: Muscle weakness (generalized) (M62.81);Difficulty in walking, not elsewhere classified (R26.2);Pain    Time: 2956-2130 PT Time Calculation (min) (ACUTE ONLY): 32 min   Charges:   PT Evaluation $PT Eval Moderate Complexity: 1 Mod PT Treatments $Therapeutic Exercise: 8-22 mins        Elizabeth Palau, PT, DPT [phone]   Vegas Coffin 02/09/2020, 3:43 PM

## 2020-02-09 NOTE — OR Nursing (Signed)
PT/INR 18.7/1.6. Last dose of coumadin was on this last Friday night. Dr. Roland Rack and Dr. Randa Lynn are both aware. Dr. Roland Rack has ordered Tranexamic Acid pre-op. It is currently infusing.

## 2020-02-09 NOTE — [place] FL2 (Signed)
[place] MEDICAID FL2 LEVEL OF CARE SCREENING TOOL     IDENTIFICATION  Patient Name: Carl Yang Birthdate: [birth year] Sex: male Admission Date (Current Location): 02/09/2020  [place] and [place] Number:  Chiropodist and Address:  [facility], [address] [place], [place] [address]      Provider Number: [id number]  Attending Physician Name and Address:  Christena Flake, MD  Relative Name and Phone Number:  Christian Mate [phone]    Current Level of Care: Hospital Recommended Level of Care: Skilled Nursing Facility Prior Approval Number:    Date Approved/Denied:   PASRR Number: [id number]  Discharge Plan: SNF    Current Diagnoses: Patient Active Problem List   Diagnosis Date Noted  . Status post left partial knee replacement 02/09/2020  . Status post revision of total knee replacement, right 05/05/2019  . 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 Hypertension associated with type 1 diabetes mellitus (HCC) 07/28/2018  . OSA on CPAP 07/28/2018  . GERD without esophagitis 07/28/2018  . Chronic non-seasonal allergic rhinitis 07/28/2018  . Chronic constipation 07/28/2018  . Primary osteoarthritis of right knee 07/28/2018  . 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 Status post total knee replacement using cement, right 07/15/2018    Orientation RESPIRATION BLADDER Height & Weight     Self, Time, Situation, Place  Normal Continent Weight:   Height:     BEHAVIORAL SYMPTOMS/MOOD NEUROLOGICAL BOWEL NUTRITION STATUS      Continent Diet(regular)  AMBULATORY STATUS COMMUNICATION OF NEEDS Skin   Limited Assist Verbally Surgical wounds                       Personal Care Assistance Level of Assistance  Bathing, Dressing Bathing  Assistance: Limited assistance   Dressing Assistance: Limited assistance     Functional Limitations Info             SPECIAL CARE FACTORS FREQUENCY  PT (By licensed PT)     PT Frequency: 5 times per week              Contractures Contractures Info: Not present    Additional Factors Info  Code Status Code Status Info: full code             Current Medications (02/09/2020):  This is the current hospital active medication list Current Facility-Administered Medications  Medication Dose Route Frequency Provider Last Rate Last Admin  . 0.9 %  sodium chloride infusion   Intravenous Continuous Poggi, Excell Seltzer, MD      . acetaminophen (TYLENOL) tablet 1,000 mg  1,000 mg Oral Q6H Poggi, Excell Seltzer, MD      . Melene Muller ON 02/10/2020] acetaminophen (TYLENOL) tablet 325-650 mg  325-650 mg Oral Q6H PRN Poggi, Excell Seltzer, MD      . atorvastatin (LIPITOR) tablet 80 mg  80 mg Oral QPM Poggi, Excell Seltzer, MD      . bisacodyl (DULCOLAX) suppository 10 mg  10 mg Rectal Daily PRN Poggi, Excell Seltzer, MD      . ceFAZolin (ANCEF) IVPB 2g/100 mL premix  2 g Intravenous Q6H Poggi, Excell Seltzer, MD      . diphenhydrAMINE (BENADRYL) 12.5 MG/5ML elixir 12.5-25 mg  12.5-25 mg Oral Q4H PRN Poggi, Excell Seltzer, MD      . docusate  sodium (COLACE) capsule 100 mg  100 mg Oral QODAY Poggi, Excell Seltzer, MD      . docusate sodium (COLACE) capsule 100 mg  100 mg Oral BID Poggi, Excell Seltzer, MD      . HYDROmorphone (DILAUDID) injection 0.25-0.5 mg  0.25-0.5 mg Intravenous Q3H PRN Poggi, Excell Seltzer, MD      . insulin pump   Subcutaneous TID WC, HS, 0200 Poggi, Excell Seltzer, MD      . loratadine (CLARITIN) tablet 10 mg  10 mg Oral Daily Poggi, Excell Seltzer, MD      . magnesium hydroxide (MILK OF MAGNESIA) suspension 30 mL  30 mL Oral Daily PRN Poggi, Excell Seltzer, MD      . metFORMIN (GLUCOPHAGE) tablet 500 mg  500 mg Oral BID WC Poggi, Excell Seltzer, MD      . metoCLOPramide (REGLAN) tablet 5-10 mg  5-10 mg Oral Q8H PRN Poggi, Excell Seltzer, MD       Or  . metoCLOPramide (REGLAN)  injection 5-10 mg  5-10 mg Intravenous Q8H PRN Poggi, Excell Seltzer, MD      . multivitamin with minerals tablet 1 tablet  1 tablet Oral Daily Poggi, Excell Seltzer, MD      . ondansetron (ZOFRAN) tablet 4 mg  4 mg Oral Q6H PRN Poggi, Excell Seltzer, MD       Or  . ondansetron (ZOFRAN) injection 4 mg  4 mg Intravenous Q6H PRN Poggi, Excell Seltzer, MD      . oxyCODONE (Oxy IR/ROXICODONE) immediate release tablet 5-10 mg  5-10 mg Oral Q4H PRN Poggi, Excell Seltzer, MD      . pantoprazole (PROTONIX) EC tablet 40 mg  40 mg Oral Daily Poggi, Excell Seltzer, MD      . ramipril (ALTACE) capsule 2.5 mg  2.5 mg Oral QPM Poggi, Excell Seltzer, MD      . sodium phosphate (FLEET) 7-19 GM/118ML enema 1 enema  1 enema Rectal Once PRN Poggi, Excell Seltzer, MD      . traMADol (ULTRAM) tablet 50 mg  50 mg Oral Q6H PRN Poggi, Excell Seltzer, MD      . warfarin (COUMADIN) tablet 5 mg  5 mg Oral ONCE-1600 Poggi, Excell Seltzer, MD      . Warfarin - Physician Dosing Inpatient   Does not apply q20 Poggi, Excell Seltzer, MD         Discharge Medications: Please see discharge summary for a list of discharge medications.  Relevant Imaging Results:  Relevant Lab Results:   Additional Information [id number]  Barrie Dunker, RN

## 2020-02-09 NOTE — Anesthesia Preprocedure Evaluation (Signed)
Anesthesia Evaluation  Patient identified by MRN, date of birth, ID band Patient awake    Reviewed: Allergy & Precautions, NPO status , Patient's Chart, lab work & pertinent test results  History of Anesthesia Complications Negative for: history of anesthetic complications  Airway Mallampati: III  TM Distance: >3 FB Neck ROM: Full    Dental no notable dental hx.    Pulmonary sleep apnea and Continuous Positive Airway Pressure Ventilation , neg COPD,    breath sounds clear to auscultation- rhonchi (-) wheezing      Cardiovascular hypertension, Pt. on medications (-) angina+ CAD, + Past MI, + Cardiac Stents (last stent ~2003) and + Peripheral Vascular Disease   Rhythm:Regular Rate:Normal - Systolic murmurs and - Diastolic murmurs    Neuro/Psych neg Seizures negative neurological ROS  negative psych ROS   GI/Hepatic Neg liver ROS, GERD  ,  Endo/Other  diabetes, Insulin Dependent  Renal/GU Renal InsufficiencyRenal disease     Musculoskeletal  (+) Arthritis ,   Abdominal (+) - obese,   Peds  Hematology negative hematology ROS (+)   Anesthesia Other Findings Past Medical History: No date: Arthritis No date: Cancer (HCC)     Comment:  Melanoma No date: Complication of anesthesia     Comment:  After surgery at [facility], the patient coded during the               night. No date: Coronary artery disease     Comment:  Stents No date: Diabetes mellitus without complication (HCC) No date: DVT (deep venous thrombosis) (HCC)     Comment:  Right lower limb No date: Dyspnea No date: GERD (gastroesophageal reflux disease) No date: Myocardial infarction [facility])     Comment:  1998, 2003 No date: Neuromuscular disorder ([place])     Comment:  Peripheral Neuropathy r/t DM No date: Peripheral vascular disease ([place])     Comment:  Diabetes No date: Sleep apnea     Comment:  Uses CPAP   Reproductive/Obstetrics                              Anesthesia Physical Anesthesia Plan  ASA: III  Anesthesia Plan: General   Post-op Pain Management:    Induction: Intravenous  PONV Risk Score and Plan: 1 and Ondansetron and Dexamethasone  Airway Management Planned: Oral ETT  Additional Equipment:   Intra-op Plan:   Post-operative Plan: Extubation in OR  Informed Consent: I have reviewed the patients History and Physical, chart, labs and discussed the procedure including the risks, benefits and alternatives for the proposed anesthesia with the patient or authorized representative who has indicated his/her understanding and acceptance.     Dental advisory given  Plan Discussed with: CRNA and Anesthesiologist  Anesthesia Plan Comments:         Anesthesia Quick Evaluation

## 2020-02-09 NOTE — Op Note (Signed)
02/09/2020  9:21 AM  Patient:   Carl Yang  Pre-Op Diagnosis:   Osteoarthritis of medial compartment, left knee.  Post-Op Diagnosis:   Same  Procedure:   Left unicondylar knee arthroplasty.  Surgeon:   Pascal Lux, MD  Assistant:   Cameron Proud, PA-C  Anesthesia:   GET  Findings:   As above.  Complications:   None  EBL:   5 cc  Fluids:   800 cc crystalloid  UOP:   None  TT:   85 minutes at 300 mmHg  Drains:   None  Closure:   Staples  Implants:   All-cemented Biomet [place] system with a medium femoral component, a "D" sized tibial tray, and a 7 mm meniscal bearing insert.  Brief Clinical Note:   The patient is a 79 year old male with a long history of gradually worsening medial sided left knee pain. His symptoms have progressed despite medications, activity modification, etc. His history and examination are consistent with degenerative joint disease, primarily limited to the medial side of his knee. The patient presents at this time for a left partial knee replacement.  Procedure:   The patient was brought into the operating room and lain in the supine position.  After adequate general endotracheal intubation and anesthesia was obtained, the patient was repositioned so that the non-surgical leg was placed in a flexed and abducted position in the yellow fin leg holder while the surgical extremity was placed over the Biomet leg holder. The left lower extremity was prepped with ChloraPrep solution before being draped sterilely. Preoperative antibiotics were administered. After performing a timeout to verify the appropriate surgical site, the limb was exsanguinated with an Esmarch and the tourniquet inflated to 300 mmHg.   A standard anterior approach to the knee was made through an approximately 3.5-4 inch incision. The incision was carried down through the subcutaneous tissues to expose the superficial retinaculum. This was split the length the incision and the  medial flap elevated sufficiently to expose the medial retinaculum. The medial retinaculum was incised along the medial border of the patella tendon and extended proximally along the medial border of the patella, leaving a 3-4 mm cuff of tissue. The soft tissues were elevated off the anteromedial aspect of the proximal tibia. The anterior portion of the meniscus was removed after performing a subtotal excision of the infrapatellar fat pad. The anterior cruciate ligament was inspected and found to be in excellent condition. Osteophytes were removed from the inferior pole of the patella as well as from the notch using a quarter-inch osteotome. There were significant degenerative changes of both the femur and tibia on the medial side. The medial femoral condyle was sized using the small and medium sizers. It was felt that the medium guide best optimized the contour of the femur. This was left in place and the external tibial guide positioned. The coupling device was used to connect the guide to the medial femoral condylar sizer to optimize appropriate orientation. Two guide pins were inserted into the cutting block before the coupling device and sizer were removed. The appropriate tibial cut was made using the oscillating and reciprocating saws. The piece was removed in its entirety and taken to the back table where it was sized and found to be optimally replicated by a "D" sized component. The 9 mm spacer was inserted to verify that sufficient bone had been removed.  Attention was directed to femoral side. The intramedullary canal was accessed through a 4 mm drill hole.  The intramedullary guide was positioned before the guide for the femoral condylar holes was positioned. The appropriate coupling device connected this guide to the intramedullary guide before both drill holes were created in the distal aspect of the medial femoral condyle. The devices were removed and the posterior condylar cutting block inserted.  The appropriate cut was made using the reciprocating saw and this piece removed. The #0 spigot was inserted and the initial bone milling performed. A trial femoral component was inserted and both the flexion and extension gaps measured. In flexion, the gap measured 9 mm whereas in extension, it measured 5 mm. Therefore, the #4 spigot was selected and the secondary bone milling performed. Repeat sizing demonstrated symmetric flexion and extension gaps. The bone was removed from the postero-medial and postero-lateral aspects of the femoral condyle, as well as from the beneath the collar of the spigot. Bone also was removed from the anterior portion of the femur so as to minimize any potential impingement with the meniscal bearing insert. The trial components removed and several drill holes placed into the distal femoral condyle to further augment cement fixation.  Attention was redirected to the tibial side. The "D" sized tibial tray was positioned and temporarily secured using the appropriate spiked nail. The keel was created using the bi-bladed reciprocating saw and hoe. The keeled "D" sized trial tibial tray was inserted to be sure that it seated properly. At this point, a total of 20 cc of Exparel diluted out to 40 cc with normal saline and 30 cc of 0.5% Sensorcaine was injected in and around the posterior and medial capsular tissues, as well as the peri-incisional tissues to help with postoperative pain control.  The bony surfaces were prepared for cementing by irrigating them thoroughly with bacitracin saline solution using the jet lavage system before packing them with a dry Ray-Tec sponge. Meanwhile, cement was being mixed on the back table. When the cement was ready, the tibial tray was cemented in first. The excess cement was removed using a Surveyor, quantity after impacting it into place. Next, the femoral component was impacted into place. Again the excess cement was removed using a Surveyor, quantity.  The 7 mm spacer was inserted and the knee brought into near full extension while the cement hardened. Once the cement hardened, the spacer was removed and the 6 mm and 7 mm meniscal bearing inserts were trialed. The 7 mm insert demonstrated excellent tracking while the knee was placed through a range of motion, and showed no evidence towards subluxation or dislocation. In addition, it did not fit too tightly. Therefore, the permanent 7 mm meniscal bearing insert was snapped into position after verifying that no cement had been retained posteriorly. Again the knee was placed through a range of motion with the findings as described above.  The wound was copiously irrigated with sterile saline solution via the jet lavage system before the retinacular layer was reapproximated using #0 Vicryl interrupted sutures. At this point, 1 g of transexemic acid in 10 cc of normal saline was injected intra-articularly. The subcutaneous tissues were closed in two layers using 2-0 Vicryl interrupted sutures before the skin was closed using staples. A sterile occlusive dressing was applied to the knee before the patient was awakened, extubated, and returned to the recovery room in satisfactory condition after tolerating the procedure well. A Polar Care device was applied to the knee as well.

## 2020-02-09 NOTE — Transfer of Care (Signed)
Immediate Anesthesia Transfer of Care Note  Patient: Carl Yang  Procedure(s) Performed: UNICOMPARTMENTAL KNEE (Left Knee)  Patient Location: PACU  Anesthesia Type:General  Level of Consciousness: sedated  Airway & Oxygen Therapy: Patient Spontanous Breathing and Patient connected to face mask oxygen  Post-op Assessment: Report given to RN and Post -op Vital signs reviewed and stable  Post vital signs: Reviewed and stable  Last Vitals:  Vitals Value Taken Time  BP 127/67 02/09/20 0948  Temp 36.6 C 02/09/20 0948  Pulse 72 02/09/20 0953  Resp 19 02/09/20 0953  SpO2 98 % 02/09/20 0953  Vitals shown include unvalidated device data.  Last Pain:  Vitals:   02/09/20 0948  TempSrc:   PainSc: Asleep         Complications: No apparent anesthesia complications

## 2020-02-09 NOTE — H&P (Signed)
History of Present Illness: Carl Yang is a 79 y.o. male who presents today for his surgical history and physical for upcoming left partial knee arthroplasty. Surgery was scheduled with Dr. Roland Rack on 02/09/2020. The patient reports 3 out of 10 pain score in the left knee. He denies any trauma or injury affecting the left knee since he was last evaluated. 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 Patient stopped his doxycycline 1 month ago. The patient denies any signs of infection at home such as fevers or any drainage from the right total knee incision at today's visit. He denies any personal history of heart attack or stroke. The patient does have a history of diabetes, denies any personal history of asthma or COPD. The patient does have a history of DVT.  Past Medical History: . Adenomatous colon polyp, unspecified 05/05/2014  . Anemia  . Atrial fibrillation (CMS-HCC)  . Benign neoplasm of large bowel, unspecified  . BPH (benign prostatic hypertrophy)  . Chronic anticoagulation 05/05/2014  . Congenital foot deformity  . Coronary atherosclerosis of autologous vein bypass graft  . Coronary atherosclerosis of native coronary artery  . Diabetes mellitus type 2, uncomplicated (CMS-HCC)  . DVT (deep venous thrombosis) (CMS-HCC)  . Dyslipidemia  . GERD (gastroesophageal reflux disease)  . H/O adenomatous polyp of colon 08/18/2014  . Hyperlipidemia  . Hypersomnolence  with sleep apnea  . Hypertension  benign  . Melanoma in situ of scalp (CMS-HCC) 12/24/2014  . Microalbuminuria  stable  . Nephropathy  . Osteoarthrosis  . Pure hypercholesterolemia  . Renal insufficiency  . Retinopathy  . Sleep apnea  on CPAP   Past Surgical History: . 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 CATARACT EXTRACTION Left 08/2014  . CATARACT EXTRACTION Right 01/16  . COLONOSCOPY 07/27/2000  Adenomatous Polyps  . COLONOSCOPY 04/25/2009, 05/08/2002, 12/23/2014  PH Adenomatous Polyps: CBF 04/2014  . COLONOSCOPY 12/23/2014  Adenomatous Polyp: CBF 12/2019  . EGD 07/27/2000  . fusion L4-L5  with metal implant  . melanoma excision 12/24/2014  . One stage revision right TKA using all cemented biomet 360 revision knee system with a 75 mm PS femur with a 120 mm stem a 79 mm tibial tray with a 120 mm stem a 20 mm PS insert and a 37 x 10 mm all poly 3 pegged domed patella Right 05/05/2019  Dr.Poggi  . Right TKA using all cemented biomet vanguard system with a 75 mm PCR femur an 83 mm tibial tray with a 10 mm AS E-poly insert and a 37 x 10 mm all poly 3 pegged domed patella Right 07/15/2018  Dr.poggi   Past Family History: . Leukemia Other  Wife  . Cancer Mother  . Alzheimer's disease Father  . Breast cancer Sister  . No Known Problems Brother  . No Known Problems Sister  . No Known Problems Son  . No Known Problems Daughter   Medications: Current Outpatient Medications . amino acids-protein hydrolys (PRO-STAT SUGAR FREE) 15-100 gram-kcal/30 mL Liqd Take by mouth 2 (two) times daily  . atorvastatin (LIPITOR) 80 MG tablet TAKE 1 TABLET BY MOUTH EVERY DAY 90 tablet 1  . blood glucose meter kit Use as directed 1 each 0  . DEXCOM G6 RECEIVER Misc Use to  monitor blood sugar. 1 each 0  . DEXCOM G6 SENSOR Devi Use to monitor blood sugar. Replace every 10 days 3 Device 12  . DEXCOM G6 TRANSMITTER Devi Use to monitor blood sugar. Replace every 3 months 1 Device 4  . DOCUSATE CALCIUM (STOOL SOFTENER ORAL) Take 1 tablet by mouth once daily.  Marland Kitchen esomeprazole (NEXIUM) 40 MG DR capsule Take 1 capsule (40 mg total) by mouth once daily 90 capsule 1  . fexofenadine (ALLEGRA) 180 MG tablet TAKE 1 TABLET BY MOUTH ONCE A DAY 90 tablet 1  . glucagon (BAQSIMI) 3 mg/actuation  nasal spray 1 spray by Intranasal route as needed (in case of severe hypoglycemia) 2 each 1  . insulin LISPRO (HUMALOG) injection (concentration 100 units/mL) INJECT UP TO 150 UNITS UNDER THE SKIN VIA INSULIN PUMP PER DAY 50 mL 5  . metFORMIN (GLUCOPHAGE) 500 MG tablet TAKE 1 TABLET(500 MG) BY MOUTH TWICE DAILY WITH MEALS 180 tablet 1  . mometasone (ELOCON) 0.1 % lotion APPLY TO THE AFFECTED AREA EVERY DAY 30 mL 1  . MULTIVITAMIN ORAL Take 1 tablet by mouth once daily.  Glory Rosebush DELICA LANCETS 33 gauge Misc TEST 5 TIMES DAILY 3  . ramipriL (ALTACE) 2.5 MG capsule Take 1 capsule (2.5 mg total) by mouth once daily 90 capsule 1  . sennosides (SENOKOT) 8.6 mg tablet Take by mouth Take 2 tablets by mouth 2 (two) times daily.  Marland Kitchen UNABLE TO FIND CBD Hemp Oil- applies oil twice daily  . UNABLE TO FIND Inhale into the lungs  . warfarin (COUMADIN) 3 MG tablet Take 1 tablet (3 mg total) by mouth once daily 90 tablet 0  . ONETOUCH ULTRA BLUE TEST STRIP test strip by XX route 2 (two) times daily Use as instructed. E10.42 100 each 6   No current Epic-ordered facility-administered medications on file.   Allergies: No Known Allergies   Review of Systems:  A comprehensive 14 point ROS was performed, reviewed by me today, and the pertinent orthopaedic findings are documented in the HPI.  Physical Exam: BP 124/68 (BP Location: Left upper arm, Patient Position: Sitting, BP Cuff Size: Adult)  Ht 190.5 cm (6' 3" )  Wt (!) 101.9 kg (224 lb 9.6 oz)  BMI 28.07 kg/m  General/Constitutional: The patient appears to be well-nourished, well-developed, and in no acute distress. Neuro/Psych: Normal mood and affect, oriented to person, place and time. Eyes: Non-icteric. Pupils are equal, round, and reactive to light, and exhibit synchronous movement. ENT: Unremarkable. Lymphatic: No palpable adenopathy. Respiratory: Lungs clear to auscultation, Normal chest excursion, No wheezes and Non-labored  breathing Cardiovascular: Regular rate and rhythm. No murmurs. and No edema, swelling or tenderness, except as noted in detailed exam. Integumentary: No impressive skin lesions present, except as noted in detailed exam. Musculoskeletal: Unremarkable, except as noted in detailed exam.  Leftknee exam: ALIGNMENT:Mild varus SKIN:Unremarkable SWELLING:Minimal EFFUSION:At mostatrace WARMTH:None TENDERNESS:Minimal tenderness alongmedialjoint line ROM:0-130 with minimaldiscomfort in maximal flexion McMURRAY'S:Equivocallypositive PATELLOFEMORAL:Normal tracking with no peri-patellar tenderness and negative apprehension sign CREPITUS:None LACHMAN'S:Negative PIVOT SHIFT:Negative ANTERIOR DRAWER:Negative POSTERIOR DRAWER:Negative VARUS/VALGUS:Stable  He isneurovascularly intact to the left lower extremity and foot.  Imaging: AP, lateral and sunrise views of the left knee were obtained today in the office and reviewed by me. These x-rays demonstrate moderate degenerative changes involving the medial compartment of the right knee decreased joint space in addition to subchondral sclerosis. The lateral compartment appears well-preserved. Patellofemoral compartment is well-preserved. No acute fractures visualized. No lytic lesions noted.  Impression: Primary osteoarthritis of  left knee   Plan:  1. Treatment options were discussed today with the patient. 2. The patient is scheduled for a left partial knee arthroplasty with Dr. Roland Rack on 02/09/2020. 3. The patient was instructed on the risk and benefits of surgery and wishes to proceed at this time. 4. We will discuss with Dr. Roland Rack whether or not the patient will need to undergo prophylactic antibiotics. We will also reach out to his primary care physician on when to stop his warfarin dose. 5. The patient will follow-up per standard postop protocol. They can call the clinic they have any questions, new symptoms develop or  symptoms worsen.  The procedure was discussed with the patient, as were the potential risks (including bleeding, infection, nerve and/or blood vessel injury, persistent or recurrent pain, failure of the hardware, progression of arthritis, need for further surgery, blood clots, strokes, heart attacks and/or arhythmias, pneumonia, etc.) and benefits. The patient states his understanding and wishes to proceed.   H&P reviewed and patient re-examined. No changes.

## 2020-02-09 NOTE — Anesthesia Postprocedure Evaluation (Signed)
Anesthesia Post Note  Patient: Carl Yang  Procedure(s) Performed: UNICOMPARTMENTAL KNEE (Left Knee)  Patient location during evaluation: PACU Anesthesia Type: General Level of consciousness: awake and alert and oriented Pain management: pain level controlled Vital Signs Assessment: post-procedure vital signs reviewed and stable Respiratory status: spontaneous breathing, nonlabored ventilation and respiratory function stable Cardiovascular status: blood pressure returned to baseline and stable Postop Assessment: no signs of nausea or vomiting Anesthetic complications: no     Last Vitals:  Vitals:   02/09/20 1115 02/09/20 1147  BP: 132/70 133/71  Pulse: 66 65  Resp: 17 17  Temp:    SpO2: 97% 97%    Last Pain:  Vitals:   02/09/20 1206  TempSrc:   PainSc: 0-No pain                 Boni Maclellan

## 2020-02-09 NOTE — Progress Notes (Signed)
Inpatient Diabetes Program Recommendations  AACE/ADA: New Consensus Statement on Inpatient Glycemic Control   Target Ranges:  Prepandial:   less than 140 mg/dL      Peak postprandial:   less than 180 mg/dL (1-2 hours)      Critically ill patients:  140 - 180 mg/dL   Results for Carl Yang, Carl Yang (MRN [id number]) as of 02/09/2020 09:23  Ref. Range 02/09/2020 06:16  Glucose-Capillary Latest Ref Range: 70 - 99 mg/dL 108 (H)   Review of Glycemic Control  Diabetes history: DM1 (makes NO insulin; requires basal, correction, and carb coverage insulin) Outpatient Diabetes medications: T-Slim insulin pump with Humalog, Metformin 500 mg BID Current orders for Inpatient glycemic control: In OR  Inpatient Diabetes Program Recommendations:   Insulin Pump: If patient is admitted following surgery and MD is going to allow patient to use his insulin pump for DM control, please use Insulin Pump order set to order CBGs ACHS&2am and Insulin Pump ACHS&2am.  NOTE: Noted patient in OR today for knee surgery. Initial glucsoe 108 mg/dl at 6:16 am today. In reviewing chart, noted patient sees Dr. Honor Junes (Endocrinologist) and was last seen on 01/05/20. Per office note on 01/05/20 patient uses a T-Slim insulin pump with Humalog and the following were insulin pump settings at that time:  Basal insulin  12A 1.25 units/hour 5A 1.85 units/hour 8:30A 2.5 units/hour 11A 2.5 units/hour 2P 2.6 units/hour Total daily basal insulin: 52.5 units/24 hours  Carb Coverage 12A 1:4 1 unit for every 4 grams of carbohydrates 11A 1:3 1 unit for every 3 grams of carbohydrates  Insulin Sensitivity 1:20 1 unit drops blood glucose 20 mg/dl  Target Glucose Goals 105 mg/dl  Active Insulin time: 5 hours  NURSING: If insulin pump order set is ordered please print off the Patient insulin pump contract and flow sheet. The insulin pump contract should be signed by the patient and then placed in the chart. The patient insulin pump  flow sheet will be completed by the patient at the bedside and the RN caring for the patient will use the patient's flow sheet to document in the [facility]. RN will need to complete the Nursing Insulin Pump Flowsheet at least once a shift. Patient will need to keep extra insulin pump supplies at the bedside at all times.   Thanks, Barnie Alderman, RN, MSN, CDE Diabetes Coordinator Inpatient Diabetes Program [phone] (Team Pager from 8am to 5pm)

## 2020-02-09 NOTE — Anesthesia Procedure Notes (Signed)
Procedure Name: Intubation Date/Time: 02/09/2020 7:39 AM Performed by: Justus Memory, CRNA Pre-anesthesia Checklist: Patient identified, Patient being monitored, Timeout performed, Emergency Drugs available and Suction available Patient Re-evaluated:Patient Re-evaluated prior to induction Oxygen Delivery Method: Circle system utilized Preoxygenation: Pre-oxygenation with 100% oxygen Induction Type: IV induction Ventilation: Mask ventilation without difficulty Laryngoscope Size: McGraph and 3 Grade View: Grade I Tube type: Oral Tube size: 7.0 mm Number of attempts: 1 Airway Equipment and Method: Stylet and Video-laryngoscopy Placement Confirmation: ETT inserted through vocal cords under direct vision,  positive ETCO2 and breath sounds checked- equal and bilateral Secured at: 21 cm Tube secured with: Tape Dental Injury: Teeth and Oropharynx as per pre-operative assessment  Difficulty Due To: Difficulty was anticipated and Difficult Airway- due to anterior larynx Future Recommendations: Recommend- induction with short-acting agent, and alternative techniques readily available

## 2020-02-10 LAB — GLUCOSE, CAPILLARY
Glucose-Capillary: 127 mg/dL — ABNORMAL HIGH (ref 70–99)
Glucose-Capillary: 147 mg/dL — ABNORMAL HIGH (ref 70–99)
Glucose-Capillary: 145 mg/dL — ABNORMAL HIGH (ref 70–99)
Glucose-Capillary: 108 mg/dL — ABNORMAL HIGH (ref 70–99)
Glucose-Capillary: 100 mg/dL — ABNORMAL HIGH (ref 70–99)

## 2020-02-10 LAB — CBC
HCT: 31.9 % — ABNORMAL LOW (ref 39.0–52.0)
Hemoglobin: 10.5 g/dL — ABNORMAL LOW (ref 13.0–17.0)
MCH: 29.6 pg (ref 26.0–34.0)
MCHC: 32.9 g/dL (ref 30.0–36.0)
MCV: 89.9 fL (ref 80.0–100.0)
Platelets: 248 10*3/uL (ref 150–400)
RBC: 3.55 MIL/uL — ABNORMAL LOW (ref 4.22–5.81)
RDW: 15.2 % (ref 11.5–15.5)
WBC: 13.2 10*3/uL — ABNORMAL HIGH (ref 4.0–10.5)
nRBC: 0 % (ref 0.0–0.2)

## 2020-02-10 LAB — BASIC METABOLIC PANEL
Anion gap: 4 — ABNORMAL LOW (ref 5–15)
BUN: 20 mg/dL (ref 8–23)
CO2: 24 mmol/L (ref 22–32)
Calcium: 8.2 mg/dL — ABNORMAL LOW (ref 8.9–10.3)
Chloride: 109 mmol/L (ref 98–111)
Creatinine, Ser: 1.19 mg/dL (ref 0.61–1.24)
GFR calc Af Amer: >60 mL/min (ref >60)
GFR calc non Af Amer: 59 mL/min — ABNORMAL LOW (ref >60)
Glucose, Bld: 157 mg/dL — ABNORMAL HIGH (ref 70–99)
Potassium: 4.6 mmol/L (ref 3.5–5.1)
Sodium: 137 mmol/L (ref 135–145)

## 2020-02-10 LAB — PROTIME-INR
INR: 1.7 — ABNORMAL HIGH (ref 0.8–1.2)
Prothrombin Time: 19.3 seconds — ABNORMAL HIGH (ref 11.4–15.2)

## 2020-02-10 MED ORDER — MELATONIN 5 MG PO TABS
2.5000 mg | ORAL_TABLET | Freq: Every day | ORAL | Status: DC
  Administered 2020-02-10 – 2020-02-11 (×2): 2.5 mg via ORAL
  Filled 2020-02-10 (×2): qty 1

## 2020-02-10 MED ORDER — WARFARIN SODIUM 3 MG PO TABS
3.0000 mg | ORAL_TABLET | Freq: Every day | ORAL | Status: DC
  Administered 2020-02-10 – 2020-02-11 (×2): 3 mg via ORAL
  Filled 2020-02-10 (×3): qty 1

## 2020-02-10 MED ORDER — ENOXAPARIN SODIUM 40 MG/0.4ML ~~LOC~~ SOLN
40.0000 mg | SUBCUTANEOUS | Status: DC
  Administered 2020-02-10 – 2020-02-11 (×2): 40 mg via SUBCUTANEOUS
  Filled 2020-02-10 (×2): qty 0.4

## 2020-02-10 NOTE — Progress Notes (Signed)
Inpatient Diabetes Program Recommendations  AACE/ADA: New Consensus Statement on Inpatient Glycemic Control   Target Ranges:  Prepandial:   less than 140 mg/dL      Peak postprandial:   less than 180 mg/dL (1-2 hours)      Critically ill patients:  140 - 180 mg/dL   Results for Carl Yang, Carl Yang (MRN [id number]) as of 02/10/2020 11:28  Ref. Range 02/09/2020 06:16 02/09/2020 09:48 02/09/2020 16:56 02/09/2020 21:34 02/10/2020 01:27 02/10/2020 05:14 02/10/2020 07:38  Glucose-Capillary Latest Ref Range: 70 - 99 mg/dL 108 (H) 89 230 (H) 143 (H) 127 (H) 147 (H) 145 (H)   Review of Glycemic Control  Diabetes history: DM1 (makes NO insulin; requires basal, correction, and carb coverage insulin) Outpatient Diabetes medications: T-Slim insulin pump with Humalog, Metformin 500 mg BID Current orders for Inpatient glycemic control: Insulin Pump ACHS&2am, Metformin 500 mg BID  NOTE: Spoke with patient regarding diabetes and home regimen for diabetes management.  Patient uses a T-slim insulin pump with Humalog insulin as an outpatient. Patient notes that he starting using the T-slim in March 2021 and prior to that he used a Medtronic pump.  Patient has insulin pump connected and using for glycemic control and patient also has Dexcom CGM on.  Patient states that his insulin pump settings should be exactly as noted  in office visit note by Dr. Honor Junes on 01/05/20. Patient was not able to pull up settings at this time and notes that he is still learning about all the features of the T-slim insulin pump. Patient states that he has all needed supplies to change out site and insulin reservoir for his insulin pump and also has needed supplies to change out Dexcom sensor when needed.  Patient reports that his glucose has been trending very well with the T-Slim pump after some initial issues with hypoglycemia when he first started the pump. Patient would like to continue to use his insulin pump for DM management as an inpatient  and when he goes to rehab. Patient verbalized understanding of information discussed and states that he has no questions at this time. Will continue to follow along while inpatient.    NURSING:  The patient insulin pump flow sheet will be completed by the patient at the bedside and the RN caring for the patient will use the patient's flow sheet to document in the [facility]. RN will need to complete the Nursing Insulin Pump Flowsheet at least once a shift. Patient will need to keep extra insulin pump supplies at the bedside at all times.   Thanks, Barnie Alderman, RN, MSN, CDE Diabetes Coordinator Inpatient Diabetes Program [phone] (Team Pager from 8am to 5pm)

## 2020-02-10 NOTE — Plan of Care (Signed)
°  Problem: Clinical Measurements: °Goal: Will remain free from infection °Outcome: Progressing °  °Problem: Activity: °Goal: Risk for activity intolerance will decrease °Outcome: Progressing °  °Problem: Nutrition: °Goal: Adequate nutrition will be maintained °Outcome: Progressing °  °

## 2020-02-10 NOTE — Progress Notes (Signed)
Physical Therapy Treatment Patient Details Name: Carl Yang MRN: [id number] DOB: [birth year] Today's Date: 02/10/2020    History of Present Illness Pt admitted for L partial knee. 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 Other PMH includes anemia, GERD, DVT, and HLD.    PT Comments    Pt is making good progress towards goals and report minimal pain this date. Good endurance with there-ex and ambulation in hallway. RW used. Reports this is easier than previous total joint surgery. He is still hopeful to dc to rehab despite encouragement to try HHPT due to great progress. Will continue to progress as able.   Follow Up Recommendations  Home health PT     Equipment Recommendations  None recommended by PT    Recommendations for Other Services       Precautions / Restrictions Precautions Precautions: Fall;Knee Precaution Booklet Issued: Yes (comment) Restrictions Weight Bearing Restrictions: Yes LLE Weight Bearing: Weight bearing as tolerated    Mobility  Bed Mobility Overal bed mobility: Modified Independent Bed Mobility: Supine to Sit     Supine to sit: Modified independent (Device/Increase time)     General bed mobility comments: safe technique. Doesn't require cues for sequencing. Upright posture noted.  Transfers Overall transfer level: Needs assistance Equipment used: Rolling walker (2 wheeled) Transfers: Sit to/from Stand Sit to Stand: Modified independent (Device/Increase time)         General transfer comment: safe technique from lower surface. Upright posture noted. RW used  Ambulation/Gait Ambulation/Gait assistance: Supervision Gait Distance (Feet): 200 Feet Assistive device: Rolling walker (2 wheeled) Gait Pattern/deviations: Step-through pattern Gait velocity: 10' in 9 seconds   General Gait Details: ambulated around RN station with safe technique. upright posture noted with ability to keep RW moving and symmetrical steps.     Stairs             Wheelchair Mobility    Modified Rankin (Stroke Patients Only)       Balance Overall balance assessment: Needs assistance Sitting-balance support: Feet supported Sitting balance-Leahy Scale: Good     Standing balance support: No upper extremity supported Standing balance-Leahy Scale: Good                              Cognition Arousal/Alertness: Awake/alert Behavior During Therapy: WFL for tasks assessed/performed Overall Cognitive Status: Within Functional Limits for tasks assessed                                        Exercises Total Joint Exercises Goniometric ROM: L knee AROM: 0-125 degrees Other Exercises Other Exercises: supine ther-ex performed on L LE including AP, quad sets, SLRs, SAQs, and hip abd/add x 10 reps with cga    General Comments        Pertinent Vitals/Pain Pain Assessment: 0-10 Pain Score: 2  Pain Location: L knee Pain Descriptors / Indicators: Operative site guarding Pain Intervention(s): Limited activity within patient's tolerance;Repositioned;Ice applied    Home Living                      Prior Function            PT Goals (current goals can now be found in the care plan section) Acute Rehab PT Goals Patient Stated Goal: to go home PT Goal Formulation: With patient Time For Goal  Achievement: 02/23/20 Potential to Achieve Goals: Good Progress towards PT goals: Progressing toward goals    Frequency    BID      PT Plan Current plan remains appropriate    Co-evaluation              AM-PAC PT "6 Clicks" Mobility   Outcome Measure  Help needed turning from your back to your side while in a flat bed without using bedrails?: None Help needed moving from lying on your back to sitting on the side of a flat bed without using bedrails?: None Help needed moving to and from a bed to a chair (including a wheelchair)?: None Help needed standing up from a chair  using your arms (e.g., wheelchair or bedside chair)?: None Help needed to walk in hospital room?: A Little Help needed climbing 3-5 steps with a railing? : A Little 6 Click Score: 22    End of Session Equipment Utilized During Treatment: Gait belt Activity Tolerance: Patient tolerated treatment well Patient left: in chair;with chair alarm set;with SCD's reapplied Nurse Communication: Mobility status PT Visit Diagnosis: Muscle weakness (generalized) (M62.81);Difficulty in walking, not elsewhere classified (R26.2);Pain Pain - Right/Left: Left Pain - part of body: Knee     Time: 0102-7253 PT Time Calculation (min) (ACUTE ONLY): 27 min  Charges:  $Gait Training: 8-22 mins $Therapeutic Exercise: 8-22 mins                     Elizabeth Palau, PT, DPT [phone]    Carl Yang 02/10/2020, 10:39 AM

## 2020-02-10 NOTE — Evaluation (Signed)
Occupational Therapy Evaluation Patient Details Name: Winthrop Lemelin MRN: [id number] DOB: 29-Mar-1941 Today's Date: 02/10/2020    History of Present Illness Pt admitted for L partial knee replacement. 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 Other PMH includes anemia, diabetes, GERD, DVT, and HLD.   Clinical Impression   Mr. Garretson seen for OT evaluation this date, POD#1 from above surgery. Pt was independent in all ADLs prior to surgery, however occasionally limited by L knee pain. Pt is eager to return to PLOF with less pain and improved safety and independence. He enjoys spending time with friends and visiting his independent living facility's fitness center.  Mr. Corriea presents with good knowledge of precautions and self care needs due to previous history of knee surgeries.  Pt currently requires minimal assist for LB dressing while in seated position due to pain and limited AROM of L knee. Pt instructed in polar care mgt, falls prevention strategies, home/routines modifications, DME/AE for LB bathing and dressing tasks, and compression stocking mgt. Pt would benefit from skilled OT services including additional instruction in dressing techniques with or without assistive devices for dressing and bathing skills to support recall and carryover prior to discharge and ultimately to maximize safety, independence, and minimize falls risk and caregiver burden. Recommend HHOT after discharge to support additional instruction of self care needs in pt's natural environment.     Follow Up Recommendations  Home health OT    Equipment Recommendations  None recommended by OT    Recommendations for Other Services       Precautions / Restrictions Precautions Precautions: Fall;Knee Precaution Booklet Issued: Yes (comment) Restrictions Weight Bearing Restrictions: Yes LLE Weight Bearing: Weight bearing as tolerated      Mobility Bed Mobility Overal bed mobility: Modified  Independent Bed Mobility: Supine to Sit     Supine to sit: Modified independent (Device/Increase time)     General bed mobility comments: not tested, mod I per PT report  Transfers Overall transfer level: Needs assistance Equipment used: Rolling walker (2 wheeled) Transfers: Sit to/from Stand Sit to Stand: Modified independent (Device/Increase time)         General transfer comment: safe technique from lower surface. Upright posture noted. RW used    Balance Overall balance assessment: Needs assistance Sitting-balance support: Feet supported Sitting balance-Leahy Scale: Good     Standing balance support: No upper extremity supported Standing balance-Leahy Scale: Good                             ADL either performed or assessed with clinical judgement   ADL Overall ADL's : Needs assistance/impaired                                       General ADL Comments: Pt generally requires setup assist for seated ADLs including feeding, grooming, upper body dressing and bathing.  Pt requires min assist for lower body dressing and bathing 2/2 pain and ROM deficits.  However, pt has good knowledge of how to use AE for LBD.  Pt requires supervision for functional mobility with RW.     Vision Baseline Vision/History: No visual deficits Patient Visual Report: No change from baseline       Perception     Praxis      Pertinent Vitals/Pain Pain Assessment: Faces Pain Score: 2  Faces Pain Scale: Hurts  a little bit Pain Location: L knee Pain Descriptors / Indicators: Operative site guarding Pain Intervention(s): Limited activity within patient's tolerance;Monitored during session;Ice applied     Hand Dominance     Extremity/Trunk Assessment Upper Extremity Assessment Upper Extremity Assessment: Overall WFL for tasks assessed   Lower Extremity Assessment Lower Extremity Assessment: LLE deficits/detail LLE Deficits / Details: expected post-op  pain/strength/ROM deficits   Cervical / Trunk Assessment Cervical / Trunk Assessment: Normal   Communication Communication Communication: No difficulties   Cognition Arousal/Alertness: Awake/alert Behavior During Therapy: WFL for tasks assessed/performed Overall Cognitive Status: Within Functional Limits for tasks assessed                                 General Comments: grossly oriented, pleasant and engaged in therapy   General Comments       Exercises Total Joint Exercises Goniometric ROM: L knee AROM: 0-125 degrees Other Exercises Other Exercises: supine ther-ex performed on L LE including AP, quad sets, SLRs, SAQs, and hip abd/add x 10 reps with cga Other Exercises: provided education re: OT role and plan of care, fall and safety precautions, self care, compression stocking and polar care management, lower body dressing, use of AE   Shoulder Instructions      Home Living Family/patient expects to be discharged to:: Private residence Living Arrangements: Alone(fiance nearby) Available Help at Discharge: Friend(s);Available PRN/intermittently Type of Home: Independent living facility Home Access: Level entry     Home Layout: One level     Bathroom Shower/Tub: Producer, television/film/video: Handicapped height     Home Equipment: Environmental consultant - 2 wheels;Bedside commode;Cane - single point;Shower seat;Adaptive equipment Adaptive Equipment: Reacher;Sock aid Additional Comments: Patient lives in independent living at [facility].      Prior Functioning/Environment Level of Independence: Independent        Comments: Pt is independent in ADLs and IADLs, including driving.  He denies any falls in last 12 mo.  He enjoys staying active and visiting the fitness center in his independent living facility.        OT Problem List: Decreased strength;Decreased range of motion;Impaired balance (sitting and/or standing);Decreased knowledge of use of DME or AE;Decreased  knowledge of precautions;Pain      OT Treatment/Interventions: Self-care/ADL training;Therapeutic exercise;DME and/or AE instruction;Therapeutic activities;Patient/family education;Balance training    OT Goals(Current goals can be found in the care plan section) Acute Rehab OT Goals Patient Stated Goal: to go home OT Goal Formulation: With patient Time For Goal Achievement: 02/24/20 Potential to Achieve Goals: Good  OT Frequency: Min 1X/week   Barriers to D/C:            Co-evaluation              AM-PAC OT "6 Clicks" Daily Activity     Outcome Measure Help from another person eating meals?: None Help from another person taking care of personal grooming?: None Help from another person toileting, which includes using toliet, bedpan, or urinal?: A Little Help from another person bathing (including washing, rinsing, drying)?: A Little Help from another person to put on and taking off regular upper body clothing?: None Help from another person to put on and taking off regular lower body clothing?: A Little 6 Click Score: 21   End of Session Equipment Utilized During Treatment: Rolling walker  Activity Tolerance: Patient tolerated treatment well Patient left: in chair;with call bell/phone within reach;with chair alarm set  OT Visit Diagnosis: Other abnormalities of gait and mobility (R26.89);Pain Pain - Right/Left: Left Pain - part of body: Knee                Time: 1308-6578 OT Time Calculation (min): 16 min Charges:  OT General Charges $OT Visit: 1 Visit OT Evaluation $OT Eval Moderate Complexity: 1 Mod OT Treatments $Self Care/Home Management : 8-22 mins  Kathyrn Drown Danylah Holden, OTR/L 02/10/20, 11:04 AM

## 2020-02-10 NOTE — Progress Notes (Signed)
  Subjective: 1 Day Post-Op Procedure(s) (LRB): UNICOMPARTMENTAL KNEE (Left) Patient reports pain as mild.   Patient is well, and has had no acute complaints or problems PT and care management to assist with d/c planning. Negative for chest pain and shortness of breath Fever: no Gastrointestinal:Negative for nausea and vomiting  Objective: Vital signs in last 24 hours: Temp:  [97.5 F (36.4 C)-98.4 F (36.9 C)] 97.6 F (36.4 C) (05/26 1146) Pulse Rate:  [50-85] 59 (05/26 1146) Resp:  [16-18] 16 (05/26 1146) BP: (117-141)/(54-81) 123/62 (05/26 1146) SpO2:  [94 %-100 %] 99 % (05/26 1146) Weight:  [97.5 kg] 97.5 kg (05/25 1945)  Intake/Output from previous day:  Intake/Output Summary (Last 24 hours) at 02/10/2020 1433 Last data filed at 02/10/2020 1417 Gross per 24 hour  Intake 1240 ml  Output 1395 ml  Net -155 ml    Intake/Output this shift: Total I/O In: 240 [P.O.:240] Out: 250 [Urine:250]  Labs: Recent Labs    02/10/20 0539  HGB 10.5*   Recent Labs    02/10/20 0539  WBC 13.2*  RBC 3.55*  HCT 31.9*  PLT 248   Recent Labs    02/10/20 0539  NA 137  K 4.6  CL 109  CO2 24  BUN 20  CREATININE 1.19  GLUCOSE 157*  CALCIUM 8.2*   Recent Labs    02/09/20 0636 02/10/20 0539  INR 1.6* 1.7*     EXAM General - Patient is Alert, Appropriate and Oriented Extremity - ABD soft Neurovascular intact Sensation intact distally Intact pulses distally Dorsiflexion/Plantar flexion intact Incision: scant drainage No cellulitis present Dressing/Incision - blood tinged drainage Motor Function - intact, moving foot and toes well on exam.  Abdomen soft with normal BS.  Past Medical History:  Diagnosis Date  . Arthritis   . Cancer ([place])    Melanoma  . Complication of anesthesia    After surgery at [facility], the patient coded during the night.  . Coronary artery disease    Stents  . Diabetes mellitus without complication ([place])   . DVT (deep venous thrombosis)  ([facility])    Right lower limb  . Dyspnea   . GERD (gastroesophageal reflux disease)   . Myocardial infarction ([place])    1998, 2003  . Neuromuscular disorder ([place])    Peripheral Neuropathy r/t DM  . Peripheral vascular disease ([place])    Diabetes  . Sleep apnea    Uses CPAP    Assessment/Plan: 1 Day Post-Op Procedure(s) (LRB): UNICOMPARTMENTAL KNEE (Left) Active Problems:   Status post left partial knee replacement  Estimated body mass index is 26.87 kg/m as calculated from the following:   Height as of this encounter: 6\' 3"  (1.905 m).   Weight as of this encounter: 97.5 kg. Advance diet Up with therapy D/C IV fluids when tolerating po intake.  Labs reviewed, PT/INR 1.7.  Continue Warfarin, bridge with Lovenox until above 2. Continue with PT. Work on having a BM. Plan is for d/c to rehab at the location that he is already a residence, [facility]  DVT Prophylaxis - Lovenox, Foot Pumps, TED hose and Warfarin Weight-Bearing as tolerated to left leg  J. Cameron Proud, PA-C [facility] Orthopaedic Surgery 02/10/2020, 2:33 PM

## 2020-02-10 NOTE — Progress Notes (Signed)
Physical Therapy Treatment Patient Details Name: Carl Yang MRN: [id number] DOB: 21-Sep-1940 Today's Date: 02/10/2020    History of Present Illness Pt admitted for L partial knee replacement. 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 Other PMH includes anemia, diabetes, GERD, DVT, and HLD.    PT Comments    Pt is making good progress towards goals with ability to ambulate RN station and perform stair training. Good endurance noted with there-ex. Continues to be motivated to participate. Will continue to progress.   Follow Up Recommendations  Home health PT     Equipment Recommendations  None recommended by PT    Recommendations for Other Services       Precautions / Restrictions Precautions Precautions: Fall;Knee Precaution Booklet Issued: Yes (comment) Restrictions Weight Bearing Restrictions: Yes LLE Weight Bearing: Weight bearing as tolerated    Mobility  Bed Mobility Overal bed mobility: Modified Independent Bed Mobility: Sit to Supine       Sit to supine: Modified independent (Device/Increase time)   General bed mobility comments: safe technique with ease of transfer  Transfers Overall transfer level: Modified independent Equipment used: Rolling walker (2 wheeled) Transfers: Sit to/from Stand Sit to Stand: Modified independent (Device/Increase time)         General transfer comment: safe technique from low recliner. Upright posture noted  Ambulation/Gait Ambulation/Gait assistance: Supervision Gait Distance (Feet): 200 Feet Assistive device: Rolling walker (2 wheeled) Gait Pattern/deviations: Step-through pattern     General Gait Details: ambulated with RW and upright posture. Good speed. Symmetrical steps   Stairs Stairs: Yes Stairs assistance: Supervision Stair Management: Two rails;Step to pattern Number of Stairs: 4 General stair comments: up/down stairs safely with upright posture   Wheelchair Mobility     Modified Rankin (Stroke Patients Only)       Balance Overall balance assessment: Modified Independent                                          Cognition Arousal/Alertness: Awake/alert Behavior During Therapy: WFL for tasks assessed/performed Overall Cognitive Status: Within Functional Limits for tasks assessed                                 General Comments: grossly oriented, pleasant and engaged in therapy      Exercises Other Exercises Other Exercises: seated/supine ther-ex performed on L LE including AP, quad sets, SLRs, SAQs, LAQ, seated knee flexion, and hip abd/add x 10 reps with cga    General Comments        Pertinent Vitals/Pain Pain Assessment: 0-10 Pain Score: 3  Pain Location: L knee Pain Descriptors / Indicators: Operative site guarding Pain Intervention(s): Limited activity within patient's tolerance    Home Living                      Prior Function            PT Goals (current goals can now be found in the care plan section) Acute Rehab PT Goals Patient Stated Goal: to go home PT Goal Formulation: With patient Time For Goal Achievement: 02/23/20 Potential to Achieve Goals: Good Progress towards PT goals: Progressing toward goals    Frequency    BID      PT Plan Current plan remains appropriate    Co-evaluation  AM-PAC PT "6 Clicks" Mobility   Outcome Measure  Help needed turning from your back to your side while in a flat bed without using bedrails?: None Help needed moving from lying on your back to sitting on the side of a flat bed without using bedrails?: None Help needed moving to and from a bed to a chair (including a wheelchair)?: None Help needed standing up from a chair using your arms (e.g., wheelchair or bedside chair)?: None Help needed to walk in hospital room?: None Help needed climbing 3-5 steps with a railing? : None 6 Click Score: 24    End of Session  Equipment Utilized During Treatment: Gait belt Activity Tolerance: Patient tolerated treatment well Patient left: in bed;with bed alarm set;with SCD's reapplied Nurse Communication: Mobility status PT Visit Diagnosis: Muscle weakness (generalized) (M62.81);Difficulty in walking, not elsewhere classified (R26.2);Pain Pain - Right/Left: Left Pain - part of body: Knee     Time: 3016-0109 PT Time Calculation (min) (ACUTE ONLY): 23 min  Charges:  $Gait Training: 8-22 mins $Therapeutic Exercise: 8-22 mins                     Elizabeth Palau, PT, DPT [phone]    Nyazia Canevari 02/10/2020, 5:06 PM

## 2020-02-11 ENCOUNTER — Encounter
Admission: RE | Admit: 2020-02-11 | Discharge: 2020-02-11 | Disposition: A | Discharge status: Home / Self Care | Payer: Medicare Other | Source: Ambulatory Visit | Attending: Internal Medicine | Admitting: Internal Medicine

## 2020-02-11 LAB — GLUCOSE, CAPILLARY
Glucose-Capillary: 110 mg/dL — ABNORMAL HIGH (ref 70–99)
Glucose-Capillary: 120 mg/dL — ABNORMAL HIGH (ref 70–99)
Glucose-Capillary: 152 mg/dL — ABNORMAL HIGH (ref 70–99)
Glucose-Capillary: 87 mg/dL (ref 70–99)
Glucose-Capillary: 92 mg/dL (ref 70–99)

## 2020-02-11 LAB — BASIC METABOLIC PANEL
Anion gap: 7 (ref 5–15)
BUN: 22 mg/dL (ref 8–23)
CO2: 25 mmol/L (ref 22–32)
Calcium: 8.6 mg/dL — ABNORMAL LOW (ref 8.9–10.3)
Chloride: 106 mmol/L (ref 98–111)
Creatinine, Ser: 1.17 mg/dL (ref 0.61–1.24)
GFR calc Af Amer: >60 mL/min (ref >60)
GFR calc non Af Amer: 60 mL/min — ABNORMAL LOW (ref >60)
Glucose, Bld: 137 mg/dL — ABNORMAL HIGH (ref 70–99)
Potassium: 4.7 mmol/L (ref 3.5–5.1)
Sodium: 138 mmol/L (ref 135–145)

## 2020-02-11 LAB — PROTIME-INR
INR: 1.7 — ABNORMAL HIGH (ref 0.8–1.2)
Prothrombin Time: 19.1 seconds — ABNORMAL HIGH (ref 11.4–15.2)

## 2020-02-11 LAB — CBC
HCT: 35.2 % — ABNORMAL LOW (ref 39.0–52.0)
Hemoglobin: 11.6 g/dL — ABNORMAL LOW (ref 13.0–17.0)
MCH: 29.2 pg (ref 26.0–34.0)
MCHC: 33 g/dL (ref 30.0–36.0)
MCV: 88.7 fL (ref 80.0–100.0)
Platelets: 281 10*3/uL (ref 150–400)
RBC: 3.97 MIL/uL — ABNORMAL LOW (ref 4.22–5.81)
RDW: 15.7 % — ABNORMAL HIGH (ref 11.5–15.5)
WBC: 13.6 10*3/uL — ABNORMAL HIGH (ref 4.0–10.5)
nRBC: 0 % (ref 0.0–0.2)

## 2020-02-11 LAB — SARS CORONAVIRUS 2 (TAT 6-24 HRS): SARS Coronavirus 2: NEGATIVE

## 2020-02-11 MED ORDER — ENOXAPARIN SODIUM 40 MG/0.4ML ~~LOC~~ SOLN: 40.0000 mg | SUBCUTANEOUS | 0 refills | Status: DC

## 2020-02-11 MED ORDER — OXYCODONE HCL 5 MG PO TABS: 5.0000 mg | ORAL_TABLET | ORAL | 0 refills | Status: DC | PRN

## 2020-02-11 MED ORDER — TRAMADOL HCL 50 MG PO TABS: 50.0000 mg | ORAL_TABLET | Freq: Four times a day (QID) | ORAL | 0 refills | Status: DC | PRN

## 2020-02-11 NOTE — Progress Notes (Signed)
  Subjective: 2 Days Post-Op Procedure(s) (LRB): UNICOMPARTMENTAL KNEE (Left) Patient reports pain as mild.   Patient is well, and has had no acute complaints or problems PT and care management to assist with d/c planning. Negative for chest pain and shortness of breath Fever: no Gastrointestinal:Negative for nausea and vomiting  Objective: Vital signs in last 24 hours: Temp:  [98 F (36.7 C)-98.2 F (36.8 C)] 98.2 F (36.8 C) (05/27 1620) Pulse Rate:  [71-84] 75 (05/27 1620) Resp:  [16] 16 (05/27 1620) BP: (128-154)/(68-84) 143/82 (05/27 1620) SpO2:  [96 %-98 %] 96 % (05/27 1620)  Intake/Output from previous day:  Intake/Output Summary (Last 24 hours) at 02/11/2020 1701 Last data filed at 02/11/2020 1048 Gross per 24 hour  Intake 480 ml  Output 650 ml  Net -170 ml    Intake/Output this shift: Total I/O In: 240 [P.O.:240] Out: -   Labs: Recent Labs    02/10/20 0539 02/11/20 0416  HGB 10.5* 11.6*   Recent Labs    02/10/20 0539 02/11/20 0416  WBC 13.2* 13.6*  RBC 3.55* 3.97*  HCT 31.9* 35.2*  PLT 248 281   Recent Labs    02/10/20 0539 02/11/20 0416  NA 137 138  K 4.6 4.7  CL 109 106  CO2 24 25  BUN 20 22  CREATININE 1.19 1.17  GLUCOSE 157* 137*  CALCIUM 8.2* 8.6*   Recent Labs    02/10/20 0539 02/11/20 0416  INR 1.7* 1.7*   EXAM General - Patient is Alert, Appropriate and Oriented Extremity - ABD soft Neurovascular intact Sensation intact distally Intact pulses distally Dorsiflexion/Plantar flexion intact Incision: scant drainage No cellulitis present Dressing/Incision - blood tinged drainage Motor Function - intact, moving foot and toes well on exam.  Abdomen soft with normal BS.  Past Medical History:  Diagnosis Date  . Arthritis   . Cancer ([place])    Melanoma  . Complication of anesthesia    After surgery at [facility], the patient coded during the night.  . Coronary artery disease    Stents  . Diabetes mellitus without complication  ([place])   . DVT (deep venous thrombosis) ([facility])    Right lower limb  . Dyspnea   . GERD (gastroesophageal reflux disease)   . Myocardial infarction ([place])    1998, 2003  . Neuromuscular disorder ([place])    Peripheral Neuropathy r/t DM  . Peripheral vascular disease ([place])    Diabetes  . Sleep apnea    Uses CPAP    Assessment/Plan: 2 Days Post-Op Procedure(s) (LRB): UNICOMPARTMENTAL KNEE (Left) Active Problems:   Status post left partial knee replacement  Estimated body mass index is 26.87 kg/m as calculated from the following:   Height as of this encounter: 6\' 3"  (1.905 m).   Weight as of this encounter: 97.5 kg. Advance diet Up with therapy D/C IV fluids  Labs reviewed, PT/INR 1.7.  Continue Warfarin, bridge with Lovenox until above 2. Continue with PT, plan for d/c to SN[facility] at [facility] [place]. COVID test ordered. Continue to work on BM.  DVT Prophylaxis - Lovenox, Foot Pumps, TED hose and Warfarin Weight-Bearing as tolerated to left leg  J. Cameron Proud, PA-C [facility] Orthopaedic Surgery 02/11/2020, 5:01 PM

## 2020-02-11 NOTE — Discharge Instructions (Signed)

## 2020-02-11 NOTE — Progress Notes (Signed)
Physical Therapy Treatment Patient Details Name: Carl Yang MRN: [id number] DOB: 1941/06/29 Today's Date: 02/11/2020    History of Present Illness Pt admitted for L partial knee replacement. 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 Other PMH includes anemia, diabetes, GERD, DVT, and HLD.    PT Comments    Pt is continuing to make good progress towards goals with ability to ambulate around RN station using RW. Reports slight discomfort this date compared to previous days. Good progress with ROM, stiff this AM. Pt continues to be motivated and ready to participate. Will continue to progress.   Follow Up Recommendations  Home health PT     Equipment Recommendations  None recommended by PT    Recommendations for Other Services       Precautions / Restrictions Precautions Precautions: Fall;Knee Precaution Booklet Issued: Yes (comment) Restrictions Weight Bearing Restrictions: Yes LLE Weight Bearing: Weight bearing as tolerated    Mobility  Bed Mobility               General bed mobility comments: not performed as he was received in chair  Transfers Overall transfer level: Modified independent Equipment used: Rolling walker (2 wheeled) Transfers: Sit to/from Stand Sit to Stand: Modified independent (Device/Increase time)         General transfer comment: safe technique from low recliner. Upright posture noted  Ambulation/Gait Ambulation/Gait assistance: Supervision Gait Distance (Feet): 200 Feet Assistive device: Rolling walker (2 wheeled) Gait Pattern/deviations: Step-through pattern Gait velocity: 10' in 10 seconds   General Gait Details: ambulated with reicprocal gait pattern. slightly slower speed this date due to soreness. Symmetrical steps noted. Occasional cues for posture.   Stairs             Wheelchair Mobility    Modified Rankin (Stroke Patients Only)       Balance Overall balance assessment: Modified  Independent Sitting-balance support: Feet supported Sitting balance-Leahy Scale: Good     Standing balance support: No upper extremity supported Standing balance-Leahy Scale: Good                              Cognition Arousal/Alertness: Awake/alert Behavior During Therapy: WFL for tasks assessed/performed Overall Cognitive Status: Within Functional Limits for tasks assessed                                        Exercises Total Joint Exercises Goniometric ROM: L knee AROM: 4- 118 degrees Other Exercises Other Exercises: seated/standing ther-ex performed on L LE including AP, quad sets, SLRs, LAQ, seated knee flexion, and standing hip abd. All ther-ex performed x 15 reps    General Comments        Pertinent Vitals/Pain Pain Assessment: Faces Faces Pain Scale: Hurts little more Pain Location: L knee Pain Descriptors / Indicators: Operative site guarding Pain Intervention(s): Limited activity within patient's tolerance;Repositioned;Ice applied    Home Living                      Prior Function            PT Goals (current goals can now be found in the care plan section) Acute Rehab PT Goals Patient Stated Goal: to go home PT Goal Formulation: With patient Time For Goal Achievement: 02/23/20 Potential to Achieve Goals: Good Progress towards PT goals: Progressing toward  goals    Frequency    BID      PT Plan Current plan remains appropriate    Co-evaluation              AM-PAC PT "6 Clicks" Mobility   Outcome Measure  Help needed turning from your back to your side while in a flat bed without using bedrails?: None Help needed moving from lying on your back to sitting on the side of a flat bed without using bedrails?: None Help needed moving to and from a bed to a chair (including a wheelchair)?: None Help needed standing up from a chair using your arms (e.g., wheelchair or bedside chair)?: None Help needed to  walk in hospital room?: None Help needed climbing 3-5 steps with a railing? : None 6 Click Score: 24    End of Session Equipment Utilized During Treatment: Gait belt Activity Tolerance: Patient tolerated treatment well Patient left: in chair;with chair alarm set Nurse Communication: Mobility status PT Visit Diagnosis: Muscle weakness (generalized) (M62.81);Difficulty in walking, not elsewhere classified (R26.2);Pain Pain - Right/Left: Left Pain - part of body: Knee     Time: 6213-0865 PT Time Calculation (min) (ACUTE ONLY): 26 min  Charges:  $Gait Training: 8-22 mins $Therapeutic Exercise: 8-22 mins                     Elizabeth Palau, PT, DPT [phone]    Carl Yang 02/11/2020, 11:25 AM

## 2020-02-11 NOTE — Progress Notes (Signed)
Pt has home unit but not using at this time.

## 2020-02-11 NOTE — Progress Notes (Signed)
Physical Therapy Treatment Patient Details Name: Carl Yang MRN: [id number] DOB: [birth year] Today's Date: 02/11/2020    History of Present Illness Pt admitted for L partial knee replacement. 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 Other PMH includes anemia, diabetes, GERD, DVT, and HLD.    PT Comments    Pt is making good progress towards goals with ability to ambulate around RN station with RW safely. Slight increased pain this session. Written HEP given and reviewed. All questions addressed. Continues to be motivated to participate. Will continue to progress.   Follow Up Recommendations  Home health PT     Equipment Recommendations  None recommended by PT    Recommendations for Other Services       Precautions / Restrictions Precautions Precautions: Fall;Knee Precaution Booklet Issued: Yes (comment) Restrictions Weight Bearing Restrictions: Yes LLE Weight Bearing: Weight bearing as tolerated    Mobility  Bed Mobility Overal bed mobility: Modified Independent       Supine to sit: Modified independent (Device/Increase time) Sit to supine: Modified independent (Device/Increase time)   General bed mobility comments: cued for hooking R foot under L ankle in order to pivot to side of bed. Once seated at EOB, able to demonstrate upright posture  Transfers Overall transfer level: Modified independent Equipment used: Rolling walker (2 wheeled) Transfers: Sit to/from Stand Sit to Stand: Modified independent (Device/Increase time)         General transfer comment: safe technique with RW and upright posture  Ambulation/Gait Ambulation/Gait assistance: Supervision Gait Distance (Feet): 200 Feet Assistive device: Rolling walker (2 wheeled) Gait Pattern/deviations: Step-through pattern Gait velocity: 10' in 10 seconds   General Gait Details: ambulated with reicprocal gait pattern. slightly slower speed this date due to soreness. Symmetrical  steps noted. Occasional cues for posture.   Stairs             Wheelchair Mobility    Modified Rankin (Stroke Patients Only)       Balance Overall balance assessment: Modified Independent Sitting-balance support: Feet supported Sitting balance-Leahy Scale: Good     Standing balance support: No upper extremity supported Standing balance-Leahy Scale: Good                              Cognition Arousal/Alertness: Awake/alert Behavior During Therapy: WFL for tasks assessed/performed Overall Cognitive Status: Within Functional Limits for tasks assessed                                        Exercises Other Exercises Other Exercises: supine ther-ex performed on L LE including AP, quad sets, SLRs, hip abd/add, SAQ, and hee slides. All ther-ex performed x 15 reps with occasional CGA.    General Comments        Pertinent Vitals/Pain Pain Assessment: 0-10 Pain Score: 4  Pain Location: L knee Pain Descriptors / Indicators: Operative site guarding Pain Intervention(s): Limited activity within patient's tolerance    Home Living                      Prior Function            PT Goals (current goals can now be found in the care plan section) Acute Rehab PT Goals Patient Stated Goal: to go home PT Goal Formulation: With patient Time For Goal Achievement: 02/23/20 Potential to  Achieve Goals: Good Progress towards PT goals: Progressing toward goals    Frequency    BID      PT Plan Current plan remains appropriate    Co-evaluation              AM-PAC PT "6 Clicks" Mobility   Outcome Measure  Help needed turning from your back to your side while in a flat bed without using bedrails?: None Help needed moving from lying on your back to sitting on the side of a flat bed without using bedrails?: None Help needed moving to and from a bed to a chair (including a wheelchair)?: None Help needed standing up from a chair  using your arms (e.g., wheelchair or bedside chair)?: None Help needed to walk in hospital room?: None Help needed climbing 3-5 steps with a railing? : None 6 Click Score: 24    End of Session Equipment Utilized During Treatment: Gait belt Activity Tolerance: Patient tolerated treatment well Patient left: in bed;with bed alarm set Nurse Communication: Mobility status PT Visit Diagnosis: Muscle weakness (generalized) (M62.81);Difficulty in walking, not elsewhere classified (R26.2);Pain Pain - Right/Left: Left Pain - part of body: Knee     Time: 1421-1446 PT Time Calculation (min) (ACUTE ONLY): 25 min  Charges:  $Gait Training: 8-22 mins $Therapeutic Exercise: 8-22 mins                     Elizabeth Palau, PT, DPT [phone]    Carl Yang 02/11/2020, 3:26 PM

## 2020-02-11 NOTE — Plan of Care (Signed)

## 2020-02-12 LAB — BASIC METABOLIC PANEL
Anion gap: 6 (ref 5–15)
BUN: 27 mg/dL — ABNORMAL HIGH (ref 8–23)
CO2: 27 mmol/L (ref 22–32)
Calcium: 8.2 mg/dL — ABNORMAL LOW (ref 8.9–10.3)
Chloride: 103 mmol/L (ref 98–111)
Creatinine, Ser: 1.34 mg/dL — ABNORMAL HIGH (ref 0.61–1.24)
GFR calc Af Amer: 59 mL/min — ABNORMAL LOW (ref >60)
GFR calc non Af Amer: 51 mL/min — ABNORMAL LOW (ref >60)
Glucose, Bld: 135 mg/dL — ABNORMAL HIGH (ref 70–99)
Potassium: 4.8 mmol/L (ref 3.5–5.1)
Sodium: 136 mmol/L (ref 135–145)

## 2020-02-12 LAB — GLUCOSE, CAPILLARY
Glucose-Capillary: 172 mg/dL — ABNORMAL HIGH (ref 70–99)
Glucose-Capillary: 129 mg/dL — ABNORMAL HIGH (ref 70–99)

## 2020-02-12 LAB — PROTIME-INR
INR: 1.9 — ABNORMAL HIGH (ref 0.8–1.2)
Prothrombin Time: 20.8 seconds — ABNORMAL HIGH (ref 11.4–15.2)

## 2020-02-12 MED ORDER — ENOXAPARIN SODIUM 40 MG/0.4ML ~~LOC~~ SOLN: 40.0000 mg | Freq: Once | SUBCUTANEOUS | Status: DC

## 2020-02-12 NOTE — Discharge Summary (Signed)
Physician Discharge Summary  Patient ID: Carl Yang MRN: [id number] DOB/AGE: 11-29-1940 79 y.o.  Admit date: 02/09/2020 Discharge date: 02/12/2020  Admission Diagnoses:  Status post left partial knee replacement [Z96.652]  Discharge Diagnoses: Patient Active Problem List   Diagnosis Date Noted  . Status post left partial knee replacement 02/09/2020  . Status post revision of total knee replacement, right 05/05/2019  . 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 Hypertension associated with type 1 diabetes mellitus (HCC) 07/28/2018  . OSA on CPAP 07/28/2018  . GERD without esophagitis 07/28/2018  . Chronic non-seasonal allergic rhinitis 07/28/2018  . Chronic constipation 07/28/2018  . Primary osteoarthritis of right knee 07/28/2018  . 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 Status post total knee replacement using cement, right 07/15/2018    Past Medical History:  Diagnosis Date  . Arthritis   . Cancer (HCC)    Melanoma  . Complication of anesthesia    After surgery at [facility], the patient coded during the night.  . Coronary artery disease    Stents  . Diabetes mellitus without complication (HCC)   . DVT (deep venous thrombosis) (HCC)    Right lower limb  . Dyspnea   . GERD (gastroesophageal reflux disease)   . Myocardial infarction (HCC)    1998, 2003  . Neuromuscular disorder (HCC)    Peripheral Neuropathy r/t DM  . Peripheral vascular disease (HCC)    Diabetes  . Sleep apnea    Uses CPAP     Transfusion: None   Consultants (if any):   Discharged Condition: Improved  Hospital Course: Carl Yang is an 79 y.o. male who was admitted 02/09/2020 with a diagnosis of osteoarthritis of the medial compartment of the left knee and went to the operating room on 02/09/2020 and  underwent the above named procedures.    Surgeries: Procedure(s): UNICOMPARTMENTAL KNEE on 02/09/2020 Patient tolerated the surgery well. Taken to PACU where she was stabilized and then transferred to the orthopedic floor.  Started on Lovenox 40mg  q 24 hrs in addition to Warfarin 3mg  daily. Foot pumps applied bilaterally at 80 mm. Heels elevated on bed with rolled towels. No evidence of DVT. Negative Homan. Physical therapy started on day #1 for gait training and transfer. OT started day #1 for ADL and assisted devices.  Patient's IV was removed on POD1.  Implants: All-cemented Bi[employer] [place] system with a medium femoral component, a "D" sized tibial tray, and a 7 mm meniscal bearing insert.  He was given perioperative antibiotics:  Anti-infectives (From admission, onward)   Start     Dose/Rate Route Frequency Ordered Stop   02/09/20 1400  ceFAZolin (ANCEF) IVPB 2g/100 mL premix     2 g 200 mL/hr over 30 Minutes Intravenous Every 6 hours 02/09/20 1347 02/10/20 0759   02/09/20 0615  ceFAZolin (ANCEF) IVPB 2g/100 mL premix     2 g 200 mL/hr over 30 Minutes Intravenous On call to O.R. 02/09/20 0606 02/09/20 0750   02/09/20 8841  ceFAZolin (ANCEF) 2-4 GM/100ML-% IVPB    Note to Pharmacy: Mike Craze   : cabinet override      02/09/20 6606 02/09/20 0749    .  He was given sequential compression devices, early ambulation, and warfarin and Lovenox for DVT prophylaxis.  He benefited maximally from the hospital stay and there were  no complications.    Recent vital signs:  Vitals:   02/11/20 1620 02/11/20 2332  BP: (!) 143/82 120/62  Pulse: 75 80  Resp: 16 18  Temp: 98.2 F (36.8 C) 97.6 F (36.4 C)  SpO2: 96% 94%    Recent laboratory studies:  Lab Results  Component Value Date   HGB 11.6 (L) 02/11/2020   HGB 10.5 (L) 02/10/2020   HGB 12.0 (L) 02/01/2020   Lab Results  Component Value Date   WBC 13.6 (H) 02/11/2020   PLT 281 02/11/2020   Lab Results  Component Value  Date   INR 1.9 (H) 02/12/2020   Lab Results  Component Value Date   NA 136 02/12/2020   K 4.8 02/12/2020   CL 103 02/12/2020   CO2 27 02/12/2020   BUN 27 (H) 02/12/2020   CREATININE 1.34 (H) 02/12/2020   GLUCOSE 135 (H) 02/12/2020    Discharge Medications:   Allergies as of 02/12/2020   No Known Allergies     Medication List    TAKE these medications   atorvastatin 80 MG tablet Commonly known as: LIPITOR Take 1 tablet (80 mg total) by mouth daily. What changed: when to take this   docusate sodium 100 MG capsule Commonly known as: COLACE Take 100 mg by mouth every other day.   enoxaparin 40 MG/0.4ML injection Commonly known as: LOVENOX Inject 0.4 mLs (40 mg total) into the skin daily.   esomeprazole 40 MG capsule Commonly known as: NEXIUM Take 1 capsule (40 mg total) by mouth daily.   fexofenadine 180 MG tablet Commonly known as: ALLEGRA Take 180 mg by mouth daily.   HumaLOG 100 UNIT/ML injection Generic drug: insulin lispro Inject 1.5 mLs (150 Units total) into the skin continuous. Via pump   insulin pump Soln Inject into the skin. INSULIN LISPRO 100U/ML VIAL   metFORMIN 500 MG tablet Commonly known as: GLUCOPHAGE Take 500 mg by mouth 2 (two) times daily.   multivitamin with minerals Tabs tablet Take 1 tablet by mouth daily. Centrum Silver   oxyCODONE 5 MG immediate release tablet Commonly known as: Oxy IR/ROXICODONE Take 1-2 tablets (5-10 mg total) by mouth every 4 (four) hours as needed for moderate pain (pain score 4-6).   ramipril 2.5 MG capsule Commonly known as: ALTACE Take 1 capsule (2.5 mg total) by mouth daily. What changed: when to take this   traMADol 50 MG tablet Commonly known as: ULTRAM Take 1 tablet (50 mg total) by mouth every 6 (six) hours as needed for moderate pain.   warfarin 3 MG tablet Commonly known as: COUMADIN Take 1 tablet (3 mg total) by mouth daily. Patient takes 3mg  Wed,Sat, Wynelle Link What changed:   when to take  this  additional instructions            Durable Medical Equipment  (From admission, onward)         Start     Ordered   02/09/20 1347  DME Bedside commode  Once    Question:  Patient needs a bedside commode to treat with the following condition  Answer:  Status post left partial knee replacement   02/09/20 1347   02/09/20 1347  DME 3 n 1  Once     02/09/20 1347   02/09/20 1347  DME Walker rolling  Once    Question Answer Comment  Walker: With 5 Inch Wheels   Patient needs a walker to treat with the following condition Status post left partial knee replacement  02/09/20 1347          Diagnostic Studies: DG Knee Left Port  Result Date: 02/09/2020 CLINICAL DATA:  Post partial LEFT knee replacement EXAM: PORTABLE LEFT KNEE - 1-2 VIEW COMPARISON:  Portable exam 0958 hours without priors for comparison FINDINGS: Unicompartmental prosthesis identified at medial compartment LEFT knee. Bones demineralized. No fracture, dislocation, or bone destruction. Minimal atherosclerotic calcification. Postsurgical changes of anterior soft tissues identified. IMPRESSION: Unicompartmental prosthesis at medial compartment LEFT knee. No acute osseous abnormalities. Electronically Signed   By: Ulyses Southward M.D.   On: 02/09/2020 10:16   Disposition: Discharge disposition: 03-Skilled Nursing Facility     Plan for discharge to rehab on 02/12/20.  COntinue Lovenox daily until PT/INR above 2.0  Follow-up Information    Anson [place], PA-C Follow up in 14 day(s).   Specialty: Physician Assistant Why: Mindi Slicker information: [address] [place] [address] [phone]          Signed: Lenard Forth, Almon Whitford 02/12/2020, 7:00 AM

## 2020-02-12 NOTE — Progress Notes (Signed)
  Subjective: 3 Days Post-Op Procedure(s) (LRB): UNICOMPARTMENTAL KNEE (Left) Patient reports pain as mild.   Patient is well, and has had no acute complaints or problems PT and care management to assist with d/c planning. Negative for chest pain and shortness of breath Fever: no Gastrointestinal:Negative for nausea and vomiting  Objective: Vital signs in last 24 hours: Temp:  [97.6 F (36.4 C)-98.2 F (36.8 C)] 97.6 F (36.4 C) (05/27 2332) Pulse Rate:  [71-80] 80 (05/27 2332) Resp:  [16-18] 18 (05/27 2332) BP: (120-143)/(62-82) 120/62 (05/27 2332) SpO2:  [94 %-98 %] 94 % (05/27 2332)  Intake/Output from previous day:  Intake/Output Summary (Last 24 hours) at 02/12/2020 0658 Last data filed at 02/12/2020 0200 Gross per 24 hour  Intake 240 ml  Output 700 ml  Net -460 ml    Intake/Output this shift: Total I/O In: -  Out: 700 [Urine:700]  Labs: Recent Labs    02/10/20 0539 02/11/20 0416  HGB 10.5* 11.6*   Recent Labs    02/10/20 0539 02/11/20 0416  WBC 13.2* 13.6*  RBC 3.55* 3.97*  HCT 31.9* 35.2*  PLT 248 281   Recent Labs    02/11/20 0416 02/12/20 0404  NA 138 136  K 4.7 4.8  CL 106 103  CO2 25 27  BUN 22 27*  CREATININE 1.17 1.34*  GLUCOSE 137* 135*  CALCIUM 8.6* 8.2*   Recent Labs    02/11/20 0416 02/12/20 0404  INR 1.7* 1.9*   EXAM General - Patient is Alert, Appropriate and Oriented Extremity - ABD soft Neurovascular intact Sensation intact distally Intact pulses distally Dorsiflexion/Plantar flexion intact Incision: scant drainage No cellulitis present Dressing/Incision -Ace wrap removed.  Scant drainage. Motor Function - intact, moving foot and toes well on exam.  Abdomen soft with normal BS.  Past Medical History:  Diagnosis Date  . Arthritis   . Cancer ([place])    Melanoma  . Complication of anesthesia    After surgery at [facility], the patient coded during the night.  . Coronary artery disease    Stents  . Diabetes mellitus  without complication ([place])   . DVT (deep venous thrombosis) (HCC)    Right lower limb  . Dyspnea   . GERD (gastroesophageal reflux disease)   . Myocardial infarction ([place])    1998, 2003  . Neuromuscular disorder ([place])    Peripheral Neuropathy r/t DM  . Peripheral vascular disease ([place])    Diabetes  . Sleep apnea    Uses CPAP    Assessment/Plan: 3 Days Post-Op Procedure(s) (LRB): UNICOMPARTMENTAL KNEE (Left) Active Problems:   Status post left partial knee replacement  Estimated body mass index is 26.87 kg/m as calculated from the following:   Height as of this encounter: 6\' 3"  (1.905 m).   Weight as of this encounter: 97.5 kg. Advance diet Up with therapy D/C IV fluids  Labs reviewed, PT/INR 1.9.  Continue Warfarin, bridge with Lovenox until above 2. Continue with PT, plan for d/[facility] SN[facility] [place].  Today COVID test ordered. Continue to work on BM.  DVT Prophylaxis - Lovenox, Foot Pumps, TED hose and Warfarin Weight-Bearing as tolerated to left leg  Reche Dixon PA-C [facility] Orthopaedic Surgery 02/12/2020, 6:58 AM

## 2020-02-12 NOTE — Care Management Important Message (Signed)
Important Message  Patient Details  Name: Carl Yang MRN: [id number] Date of Birth: 1941-08-08   Medicare Important Message Given:  No  Patient discharged prior to arrival to unit to deliver concurrent Medicare IM.   Dannette Barbara 02/12/2020, 1:42 PM

## 2020-02-12 NOTE — Progress Notes (Signed)
MD paged regarding Lovenox, one dose to be given prior to discharge.

## 2020-02-12 NOTE — Progress Notes (Signed)
Physical Therapy Treatment Patient Details Name: Carl Yang MRN: [id number] DOB: [birth year] Today's Date: 02/12/2020    History of Present Illness Pt admitted for L partial knee replacement. 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 Other PMH includes anemia, diabetes, GERD, DVT, and HLD.    PT Comments    Pt is making good progress towards goals and is ready to go to rehab this date. Good endurance with ambulation in hallway and good progress with there-ex and AAROM. Has met goals for dc at this time.   Follow Up Recommendations  Home health PT     Equipment Recommendations  None recommended by PT    Recommendations for Other Services       Precautions / Restrictions Precautions Precautions: Fall;Knee Precaution Booklet Issued: Yes (comment) Restrictions Weight Bearing Restrictions: Yes LLE Weight Bearing: Weight bearing as tolerated    Mobility  Bed Mobility Overal bed mobility: Needs Assistance Bed Mobility: Supine to Sit     Supine to sit: Supervision     General bed mobility comments: takes slightly longer to perform mobility this time. Once seated at EOB, able to sit with upright posture  Transfers Overall transfer level: Needs assistance Equipment used: Rolling walker (2 wheeled) Transfers: Sit to/from Stand Sit to Stand: Min guard         General transfer comment: needs bed elevated for ease of mobility. Once standing, upright posture noted  Ambulation/Gait Ambulation/Gait assistance: Supervision Gait Distance (Feet): 200 Feet Assistive device: Rolling walker (2 wheeled) Gait Pattern/deviations: Step-through pattern Gait velocity: 10' in 12 seconds   General Gait Details: ambulated with reciprocal gait pattern and safe technique. Slightly slower gait speed this date   Stairs             Wheelchair Mobility    Modified Rankin (Stroke Patients Only)       Balance Overall balance assessment: Modified  Independent Sitting-balance support: Feet supported Sitting balance-Leahy Scale: Good     Standing balance support: No upper extremity supported Standing balance-Leahy Scale: Good                              Cognition Arousal/Alertness: Awake/alert Behavior During Therapy: WFL for tasks assessed/performed Overall Cognitive Status: Within Functional Limits for tasks assessed                                        Exercises Total Joint Exercises Goniometric ROM: L knee AAROM: 0-100 degrees Other Exercises Other Exercises: supine ther-ex performed on L LE including AP, quad sets, and SLRs. All ther-ex performed x 15 reps with occasional CGA.    General Comments        Pertinent Vitals/Pain Pain Assessment: 0-10 Pain Score: 5  Pain Location: L knee Pain Descriptors / Indicators: Operative site guarding Pain Intervention(s): Limited activity within patient's tolerance;Ice applied    Home Living                      Prior Function            PT Goals (current goals can now be found in the care plan section) Acute Rehab PT Goals Patient Stated Goal: to go home PT Goal Formulation: With patient Time For Goal Achievement: 02/23/20 Potential to Achieve Goals: Good Progress towards PT goals: Progressing toward goals  Frequency    BID      PT Plan Current plan remains appropriate    Co-evaluation              AM-PAC PT "6 Clicks" Mobility   Outcome Measure  Help needed turning from your back to your side while in a flat bed without using bedrails?: None Help needed moving from lying on your back to sitting on the side of a flat bed without using bedrails?: None Help needed moving to and from a bed to a chair (including a wheelchair)?: None Help needed standing up from a chair using your arms (e.g., wheelchair or bedside chair)?: None Help needed to walk in hospital room?: None Help needed climbing 3-5 steps with a  railing? : None 6 Click Score: 24    End of Session Equipment Utilized During Treatment: Gait belt Activity Tolerance: Patient tolerated treatment well Patient left: in chair Nurse Communication: Mobility status PT Visit Diagnosis: Muscle weakness (generalized) (M62.81);Difficulty in walking, not elsewhere classified (R26.2);Pain Pain - Right/Left: Left Pain - part of body: Knee     Time: 1610-9604 PT Time Calculation (min) (ACUTE ONLY): 31 min  Charges:  $Gait Training: 8-22 mins $Therapeutic Exercise: 8-22 mins                     Elizabeth Palau, PT, DPT [phone]    Carl Yang 02/12/2020, 12:36 PM

## 2020-02-12 NOTE — Progress Notes (Signed)
Per conversation with MD, reactivate order for lovenox for discharge.

## 2020-02-12 NOTE — TOC Transition Note (Signed)
Transition of Care [facility]) - CM/SW Discharge Note   Patient Details  Name: Carl Yang MRN: [id number] Date of Birth: [birth year]  [facility] CM/SW Contact:  Su Hilt, RN Phone Number: 02/12/2020, 10:56 AM   Clinical Narrative:     Patient to Discharge to [facility] today via family transport, The DC packet is on the chart and he stated that his daughter and girlfriend are coming to take him, he stated that he had a BM yesterday.  The nurse is aware of the DC packet on the chart and will review the DC paperwork and call report to the facility    Barriers to Discharge: Continued Medical Work up   Patient Goals and CMS Choice Patient states their goals for this hospitalization and ongoing recovery are:: go to rehab to get stronger to go home      Discharge Placement                       Discharge Plan and Services                                     Social Determinants of Health (SDOH) Interventions     Readmission Risk Interventions No flowsheet data found.

## 2020-02-16 ENCOUNTER — Other Ambulatory Visit
Admission: RE | Admit: 2020-02-16 | Discharge: 2020-02-16 | Disposition: A | Discharge status: Home / Self Care | Payer: Medicare Other | Source: Ambulatory Visit | Attending: Internal Medicine | Admitting: Internal Medicine

## 2020-02-16 DIAGNOSIS — Z7901 Long term (current) use of anticoagulants: Secondary | ICD-10-CM | POA: Insufficient documentation

## 2020-02-16 LAB — PROTIME-INR
INR: 1.6 — ABNORMAL HIGH (ref 0.8–1.2)
Prothrombin Time: 18.4 seconds — ABNORMAL HIGH (ref 11.4–15.2)

## 2020-02-17 ENCOUNTER — Other Ambulatory Visit
Admission: RE | Admit: 2020-02-17 | Discharge: 2020-02-17 | Disposition: A | Discharge status: Home / Self Care | Payer: Medicare Other | Source: Ambulatory Visit | Attending: Internal Medicine | Admitting: Internal Medicine

## 2020-02-17 DIAGNOSIS — Z5181 Encounter for therapeutic drug level monitoring: Secondary | ICD-10-CM | POA: Insufficient documentation

## 2020-02-17 DIAGNOSIS — Z96652 Presence of left artificial knee joint: Secondary | ICD-10-CM | POA: Insufficient documentation

## 2020-02-17 DIAGNOSIS — Z7901 Long term (current) use of anticoagulants: Secondary | ICD-10-CM | POA: Diagnosis not present

## 2020-02-17 LAB — PROTIME-INR
INR: 1.3 — ABNORMAL HIGH (ref 0.8–1.2)
Prothrombin Time: 16.1 seconds — ABNORMAL HIGH (ref 11.4–15.2)

## 2020-02-19 ENCOUNTER — Other Ambulatory Visit
Admission: RE | Admit: 2020-02-19 | Discharge: 2020-02-19 | Disposition: A | Discharge status: Home / Self Care | Payer: Medicare Other | Source: Ambulatory Visit | Attending: Internal Medicine | Admitting: Internal Medicine

## 2020-02-19 DIAGNOSIS — Z96652 Presence of left artificial knee joint: Secondary | ICD-10-CM | POA: Diagnosis not present

## 2020-02-19 DIAGNOSIS — Z7901 Long term (current) use of anticoagulants: Secondary | ICD-10-CM | POA: Diagnosis present

## 2020-02-19 LAB — PROTIME-INR
INR: 1.3 — ABNORMAL HIGH (ref 0.8–1.2)
Prothrombin Time: 15.7 seconds — ABNORMAL HIGH (ref 11.4–15.2)

## 2020-02-22 ENCOUNTER — Other Ambulatory Visit
Admission: RE | Admit: 2020-02-22 | Discharge: 2020-02-22 | Disposition: A | Discharge status: Home / Self Care | Payer: Medicare Other | Source: Skilled Nursing Facility | Attending: Internal Medicine | Admitting: Internal Medicine

## 2020-02-22 DIAGNOSIS — Z7901 Long term (current) use of anticoagulants: Secondary | ICD-10-CM | POA: Diagnosis present

## 2020-02-22 DIAGNOSIS — Z96652 Presence of left artificial knee joint: Secondary | ICD-10-CM | POA: Insufficient documentation

## 2020-02-22 LAB — PROTIME-INR
INR: 1.4 — ABNORMAL HIGH (ref 0.8–1.2)
Prothrombin Time: 16.6 seconds — ABNORMAL HIGH (ref 11.4–15.2)

## 2020-02-23 ENCOUNTER — Other Ambulatory Visit
Admission: RE | Admit: 2020-02-23 | Discharge: 2020-02-23 | Disposition: A | Discharge status: Home / Self Care | Payer: Medicare Other | Source: Ambulatory Visit | Attending: Internal Medicine | Admitting: Internal Medicine

## 2020-02-23 DIAGNOSIS — N1831 Chronic kidney disease, stage 3a: Secondary | ICD-10-CM | POA: Insufficient documentation

## 2020-02-23 DIAGNOSIS — D649 Anemia, unspecified: Secondary | ICD-10-CM | POA: Diagnosis not present

## 2020-02-23 DIAGNOSIS — E108 Type 1 diabetes mellitus with unspecified complications: Secondary | ICD-10-CM | POA: Diagnosis present

## 2020-02-23 DIAGNOSIS — E785 Hyperlipidemia, unspecified: Secondary | ICD-10-CM | POA: Diagnosis not present

## 2020-02-23 LAB — COMPREHENSIVE METABOLIC PANEL
ALT: 27 U/L (ref 0–44)
AST: 27 U/L (ref 15–41)
Albumin: 3.3 g/dL — ABNORMAL LOW (ref 3.5–5.0)
Alkaline Phosphatase: 65 U/L (ref 38–126)
Anion gap: 7 (ref 5–15)
BUN: 23 mg/dL (ref 8–23)
CO2: 26 mmol/L (ref 22–32)
Calcium: 8.6 mg/dL — ABNORMAL LOW (ref 8.9–10.3)
Chloride: 105 mmol/L (ref 98–111)
Creatinine, Ser: 1.2 mg/dL (ref 0.61–1.24)
GFR calc Af Amer: >60 mL/min (ref >60)
GFR calc non Af Amer: 58 mL/min — ABNORMAL LOW (ref >60)
Glucose, Bld: 116 mg/dL — ABNORMAL HIGH (ref 70–99)
Potassium: 4.7 mmol/L (ref 3.5–5.1)
Sodium: 138 mmol/L (ref 135–145)
Total Bilirubin: 0.4 mg/dL (ref 0.3–1.2)
Total Protein: 6.3 g/dL — ABNORMAL LOW (ref 6.5–8.1)

## 2020-02-23 LAB — CBC WITH DIFFERENTIAL/PLATELET
Abs Immature Granulocytes: 0.02 10*3/uL (ref 0.00–0.07)
Basophils Absolute: 0 10*3/uL (ref 0.0–0.1)
Basophils Relative: 1 %
Eosinophils Absolute: 0.4 10*3/uL (ref 0.0–0.5)
Eosinophils Relative: 6 %
HCT: 36.5 % — ABNORMAL LOW (ref 39.0–52.0)
Hemoglobin: 11.9 g/dL — ABNORMAL LOW (ref 13.0–17.0)
Immature Granulocytes: 0 %
Lymphocytes Relative: 23 %
Lymphs Abs: 1.6 10*3/uL (ref 0.7–4.0)
MCH: 28.9 pg (ref 26.0–34.0)
MCHC: 32.6 g/dL (ref 30.0–36.0)
MCV: 88.6 fL (ref 80.0–100.0)
Monocytes Absolute: 0.6 10*3/uL (ref 0.1–1.0)
Monocytes Relative: 8 %
Neutro Abs: 4.3 10*3/uL (ref 1.7–7.7)
Neutrophils Relative %: 62 %
Platelets: 238 10*3/uL (ref 150–400)
RBC: 4.12 MIL/uL — ABNORMAL LOW (ref 4.22–5.81)
RDW: 16.4 % — ABNORMAL HIGH (ref 11.5–15.5)
WBC: 7 10*3/uL (ref 4.0–10.5)
nRBC: 0 % (ref 0.0–0.2)

## 2020-02-23 LAB — LIPID PANEL
Cholesterol: 116 mg/dL (ref 0–200)
HDL: 35 mg/dL — ABNORMAL LOW (ref >40)
LDL Cholesterol: 72 mg/dL (ref 0–99)
Total CHOL/HDL Ratio: 3.3 RATIO
Triglycerides: 47 mg/dL (ref <150)
VLDL: 9 mg/dL (ref 0–40)

## 2020-02-24 ENCOUNTER — Other Ambulatory Visit
Admission: RE | Admit: 2020-02-24 | Discharge: 2020-02-24 | Disposition: A | Discharge status: Home / Self Care | Payer: Medicare Other | Source: Ambulatory Visit | Attending: Internal Medicine | Admitting: Internal Medicine

## 2020-02-24 DIAGNOSIS — Z7901 Long term (current) use of anticoagulants: Secondary | ICD-10-CM | POA: Diagnosis present

## 2020-02-24 LAB — PROTIME-INR
INR: 1.6 — ABNORMAL HIGH (ref 0.8–1.2)
Prothrombin Time: 18 seconds — ABNORMAL HIGH (ref 11.4–15.2)

## 2020-02-24 LAB — HCV AB W/RFLX TO VERIFICATION: HCV Ab: <0.1 {s_co_ratio} (ref 0.0–0.9)

## 2020-02-24 LAB — HCV INTERPRETATION

## 2020-02-26 ENCOUNTER — Other Ambulatory Visit
Admission: RE | Admit: 2020-02-26 | Discharge: 2020-02-26 | Disposition: A | Discharge status: Home / Self Care | Payer: Medicare Other | Source: Ambulatory Visit | Attending: Internal Medicine | Admitting: Internal Medicine

## 2020-02-26 DIAGNOSIS — Z7901 Long term (current) use of anticoagulants: Secondary | ICD-10-CM | POA: Diagnosis present

## 2020-02-26 DIAGNOSIS — Z96652 Presence of left artificial knee joint: Secondary | ICD-10-CM | POA: Insufficient documentation

## 2020-02-26 LAB — PROTIME-INR
INR: 1.7 — ABNORMAL HIGH (ref 0.8–1.2)
Prothrombin Time: 19.1 seconds — ABNORMAL HIGH (ref 11.4–15.2)

## 2020-06-27 ENCOUNTER — Other Ambulatory Visit (INDEPENDENT_AMBULATORY_CARE_PROVIDER_SITE_OTHER): Payer: Self-pay | Admitting: Vascular Surgery

## 2020-06-27 DIAGNOSIS — L97529 Non-pressure chronic ulcer of other part of left foot with unspecified severity: Secondary | ICD-10-CM

## 2020-06-28 ENCOUNTER — Ambulatory Visit (INDEPENDENT_AMBULATORY_CARE_PROVIDER_SITE_OTHER): Payer: Medicare Other | Admitting: Vascular Surgery

## 2020-06-28 ENCOUNTER — Other Ambulatory Visit: Payer: Self-pay

## 2020-06-28 ENCOUNTER — Ambulatory Visit (INDEPENDENT_AMBULATORY_CARE_PROVIDER_SITE_OTHER): Payer: Medicare Other

## 2020-06-28 ENCOUNTER — Encounter (INDEPENDENT_AMBULATORY_CARE_PROVIDER_SITE_OTHER): Payer: Self-pay | Admitting: Vascular Surgery

## 2020-06-28 VITALS — BP 148/74 | HR 43 | Ht 75.0 in | Wt 228.0 lb

## 2020-06-28 DIAGNOSIS — I825Y1 Chronic embolism and thrombosis of unspecified deep veins of right proximal lower extremity: Secondary | ICD-10-CM | POA: Diagnosis not present

## 2020-06-28 DIAGNOSIS — I739 Peripheral vascular disease, unspecified: Secondary | ICD-10-CM | POA: Insufficient documentation

## 2020-06-28 DIAGNOSIS — I7025 Atherosclerosis of native arteries of other extremities with ulceration: Secondary | ICD-10-CM | POA: Insufficient documentation

## 2020-06-28 DIAGNOSIS — N183 Chronic kidney disease, stage 3 unspecified: Secondary | ICD-10-CM

## 2020-06-28 DIAGNOSIS — L97509 Non-pressure chronic ulcer of other part of unspecified foot with unspecified severity: Secondary | ICD-10-CM

## 2020-06-28 DIAGNOSIS — E10621 Type 1 diabetes mellitus with foot ulcer: Secondary | ICD-10-CM

## 2020-06-28 DIAGNOSIS — Z955 Presence of coronary angioplasty implant and graft: Secondary | ICD-10-CM | POA: Insufficient documentation

## 2020-06-28 DIAGNOSIS — L97529 Non-pressure chronic ulcer of other part of left foot with unspecified severity: Secondary | ICD-10-CM

## 2020-06-28 NOTE — Progress Notes (Signed)
Patient ID: Carl Yang, male   DOB: 03/31/1941, 79 y.o.   MRN: [id number]  Chief Complaint  Patient presents with  . New Patient (Initial Visit)    fowler.ulcer abli     HPI Carl Yang is a 79 y.o. male.  I am asked to see the patient by Dr. Ether Griffins for evaluation of PAD with a non-healing left foot wound.  The patient has longstanding diabetes and now has an insulin pump.  He is pretty much insensate in both feet and has had neuropathic ulcers.  He has already lost his left great toe as well as parts of toes 2 and 3.  His left great toe amputation site has a large open wound with exposed bone.  This will need to be revised surgically with an extension of the amputation and resection of bone.  Given his longstanding diabetes and other vascular risk, noninvasive studies were done today.  His right ABI is 1.39 with a normal digital pressure.  His left ABI is 1.34 although this may be falsely elevated due to medial calcification.  Digit pressures were not done on the left as he has had multiple toe amputations.  The waveforms on the right are biphasic to triphasic.  His left waveforms appear biphasic in the anterior tibial and monophasic but fairly strong monophasic in the posterior tibial.   Patient also has some lower extremity swelling.  This is worse on the right than the left.  He has previous history of DVT in the right leg many years ago.  He is still on anticoagulation.   Past Medical History:  Diagnosis Date  . Arthritis   . Cancer (HCC)    Melanoma  . Complication of anesthesia    After surgery at [facility], the patient coded during the night.  . Coronary artery disease    Stents  . Diabetes mellitus without complication (HCC)   . DVT (deep venous thrombosis) (HCC)    Right lower limb  . Dyspnea   . GERD (gastroesophageal reflux disease)   . Myocardial infarction (HCC)    1998, 2003  . Neuromuscular disorder (HCC)    Peripheral Neuropathy r/t DM  . Peripheral  vascular disease (HCC)    Diabetes  . Sleep apnea    Uses CPAP    Past Surgical History:  Procedure Laterality Date  . 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  Marland Kitchen AMPUTATION TOE Left 10/09/2018   Procedure: TOE IPJ 2ND TOE LEFT;  Surgeon: Recardo Evangelist, DPM;  Location: [facility];  Service: Podiatry;  Laterality: Left;  IVA LOCAL Diabeticv - insulin pump  . BACK SURGERY     L4 & L5 Fusion  . CARDIAC CATHETERIZATION  2003  . CORONARY ANGIOPLASTY WITH STENT PLACEMENT     stents x 2  . DIAGNOSTIC LAPAROSCOPY    . EYE SURGERY Bilateral    cataracts  . PARTIAL KNEE ARTHROPLASTY Left 02/09/2020   Procedure: UNICOMPARTMENTAL KNEE;  Surgeon: Christena Flake, MD;  Location: [facility];  Service: Orthopedics;  Laterality: Left;  . TONSILLECTOMY    . TOTAL KNEE ARTHROPLASTY Right 07/15/2018   Procedure: TOTAL KNEE ARTHROPLASTY;  Surgeon: Christena Flake, MD;  Location: [facility];  Service: Orthopedics;  Laterality: Right;  . TOTAL KNEE REVISION Right 05/05/2019   Procedure: TOTAL KNEE REVISION;  Surgeon: Christena Flake, MD;  Location: [facility];  Service: Orthopedics;  Laterality: Right;     Family  History  No bleeding disorders, clotting disorders, autoimmune diseases or aneurysms   Social History   Tobacco Use  . Smoking status: Never Smoker  . Smokeless tobacco: Never Used  Vaping Use  . Vaping Use: Never used  Substance Use Topics  . Alcohol use: Yes    Alcohol/week: 1.0 standard drink    Types: 1 Glasses of wine per week    Comment: Socially  . Drug use: Never  Retired Education officer, community   No Known Allergies  Current Outpatient Medications  Medication Sig Dispense Refill  . amoxicillin-clavulanate (AUGMENTIN) 875-125 MG tablet Take by mouth.    Marland Kitchen atorvastatin (LIPITOR) 80 MG tablet Take 1 tablet (80 mg total) by mouth daily. (Patient taking differently: Take 80 mg by mouth every evening. ) 30 tablet 0  . docusate sodium (COLACE) 100 MG capsule Take 100 mg  by mouth every other day.     . enoxaparin (LOVENOX) 40 MG/0.4ML injection Inject 0.4 mLs (40 mg total) into the skin daily. 5.6 mL 0  . esomeprazole (NEXIUM) 40 MG capsule Take 1 capsule (40 mg total) by mouth daily. 30 capsule 0  . fexofenadine (ALLEGRA) 180 MG tablet Take 180 mg by mouth daily.  1  . HUMALOG 100 UNIT/ML injection Inject 1.5 mLs (150 Units total) into the skin continuous. Via pump 10 mL 0  . Insulin Human (INSULIN PUMP) SOLN Inject into the skin. INSULIN LISPRO 100U/ML VIAL    . metFORMIN (GLUCOPHAGE) 500 MG tablet Take 500 mg by mouth 2 (two) times daily.    . Multiple Vitamin (MULTIVITAMIN WITH MINERALS) TABS tablet Take 1 tablet by mouth daily. Centrum Silver    . oxyCODONE (OXY IR/ROXICODONE) 5 MG immediate release tablet Take 1-2 tablets (5-10 mg total) by mouth every 4 (four) hours as needed for moderate pain (pain score 4-6). 60 tablet 0  . ramipril (ALTACE) 2.5 MG capsule Take 1 capsule (2.5 mg total) by mouth daily. (Patient taking differently: Take 2.5 mg by mouth every evening. ) 30 capsule 0  . silver sulfADIAZINE (SILVADENE) 1 % cream Apply topically.    . traMADol (ULTRAM) 50 MG tablet Take 1 tablet (50 mg total) by mouth every 6 (six) hours as needed for moderate pain. 40 tablet 0  . warfarin (COUMADIN) 3 MG tablet Take 1 tablet (3 mg total) by mouth daily. Patient takes 3mg  Wed,Sat, Sun (Patient taking differently: Take 3 mg by mouth every evening. ) 15 tablet 0   No current facility-administered medications for this visit.      REVIEW OF SYSTEMS (Negative unless checked)  Constitutional: [] Weight loss  [] Fever  [] Chills Cardiac: [] Chest pain   [] Chest pressure   [] Palpitations   [] Shortness of breath when laying flat   [] Shortness of breath at rest   [] Shortness of breath with exertion. Vascular:  [] Pain in legs with walking   [] Pain in legs at rest   [] Pain in legs when laying flat   [] Claudication   [] Pain in feet when walking  [] Pain in feet at rest   [] Pain in feet when laying flat   [x] History of DVT   [] Phlebitis   [x] Swelling in legs   [] Varicose veins   [x] Non-healing ulcers Pulmonary:   [] Uses home oxygen   [] Productive cough   [] Hemoptysis   [] Wheeze  [] COPD   [] Asthma Neurologic:  [] Dizziness  [] Blackouts   [] Seizures   [] History of stroke   [] History of TIA  [] Aphasia   [] Temporary blindness   [] Dysphagia   [] Weakness or  numbness in arms   [x] Weakness or numbness in legs Musculoskeletal:  [x] Arthritis   [] Joint swelling   [] Joint pain   [] Low back pain Hematologic:  [] Easy bruising  [] Easy bleeding   [] Hypercoagulable state   [] Anemic  [] Hepatitis Gastrointestinal:  [] Blood in stool   [] Vomiting blood  [x] Gastroesophageal reflux/heartburn   [] Abdominal pain Genitourinary:  [] Chronic kidney disease   [] Difficult urination  [] Frequent urination  [] Burning with urination   [] Hematuria Skin:  [] Rashes   [x] Ulcers   [x] Wounds Psychological:  [] History of anxiety   []  History of major depression.    Physical Exam BP (!) 148/74   Pulse (!) 43   Ht 6\' 3"  (1.905 m)   Wt 228 lb (103.4 kg)   BMI 28.50 kg/m  Gen:  WD/WN, NAD Head: [place]/AT, No temporalis wasting.  Ear/Nose/Throat: Hearing grossly intact, nares w/o erythema or drainage, oropharynx w/o Erythema/Exudate Eyes: Conjunctiva clear, sclera non-icteric  Neck: trachea midline.  No JVD.  Pulmonary:  Good air movement, respirations not labored, no use of accessory muscles  Cardiac: Irregular Vascular:  Vessel Right Left  Radial Palpable Palpable                          DP 1+ 1+  PT Trace  1+   Gastrointestinal:. No masses, surgical incisions, or scars. Musculoskeletal: M/S 5/5 throughout.  1-2+ right lower extremity edema.  Trace left lower extremity edema.  Left great toe amputation site with open wound and there does appear to be potential exposed bone.  Toes 2 and 3 of them partially amputated.  Mild erythema surrounding the wound of the left great toe. Neurologic:  Sensation markedly reduced in both LE.  Symmetrical.  Speech is fluent. Motor exam as listed above. Psychiatric: Judgment intact, Mood & affect appropriate for pt's clinical situation. Dermatologic: Left great toe as above    Radiology No results found.  Labs No results found for this or any previous visit (from the past 2160 hour(s)).  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 Also, involved in wound healing. On appropriate medications.   CKD (chronic kidney disease) stage 3, GFR 30-59 ml/min (HCC) Hydrate and limit contrast if we do an angiogram.  Atherosclerosis of native arteries of the extremities with ulceration (HCC) Given his longstanding diabetes and other vascular risk, noninvasive studies were done today.  His right ABI is 1.39 with a normal digital pressure.  His left ABI is 1.34 although this may be falsely elevated due to medial calcification.  Digit pressures were not done on the left as he has had multiple toe amputations.  The waveforms on the right are biphasic to triphasic.  His left waveforms appear biphasic in the anterior tibial and monophasic but fairly strong monophasic in the posterior tibial.   This is a difficult situation.  His studies would indicate likely small vessel disease that may or may not be amenable to any revascularization.  I have discussed that an angiogram is still reasonable to consider further diagnostic abilities as well as potential treatment, but it is possible it would be diagnostic only if only very small vessel disease in the foot is present.  He is slated to see his podiatrist Thursday.  He wants to discuss this with his podiatrist which is certainly reasonable.  It would be reasonable to  consider reamputation of the site and then see how things go  with an angiogram as a backup option although I think an angiogram beforehand may be more helpful.  Either can be considered.  The patient is a very reasonable individual and I will reach out to his podiatrist to help coordinate the care plan going forward.      Carl Yang 06/28/2020, 9:57 AM   This note was created with Dragon medical transcription system.  Any errors from dictation are unintentional.

## 2020-06-28 NOTE — Patient Instructions (Signed)
Peripheral Vascular Disease  Peripheral vascular disease (PVD) is a disease of the blood vessels that are not part of your heart and brain. A simple term for PVD is poor circulation. In most cases, PVD narrows the blood vessels that carry blood from your heart to the rest of your body. This can reduce the supply of blood to your arms, legs, and internal organs, like your stomach or kidneys. However, PVD most often affects a person's lower legs and feet. Without treatment, PVD tends to get worse. PVD can also lead to acute ischemic limb. This is when an arm or leg suddenly cannot get enough blood. This is a medical emergency. Follow these instructions at home: Lifestyle  Do not use any products that contain nicotine or tobacco, such as cigarettes and e-cigarettes. If you need help quitting, ask your doctor.  Lose weight if you are overweight. Or, stay at a healthy weight as told by your doctor.  Eat a diet that is low in fat and cholesterol. If you need help, ask your doctor.  Exercise regularly. Ask your doctor for activities that are right for you. General instructions  Take over-the-counter and prescription medicines only as told by your doctor.  Take good care of your feet: ? Wear comfortable shoes that fit well. ? Check your feet often for any cuts or sores.  Keep all follow-up visits as told by your doctor This is important. Contact a doctor if:  You have cramps in your legs when you walk.  You have leg pain when you are at rest.  You have coldness in a leg or foot.  Your skin changes.  You are unable to get or have an erection (erectile dysfunction).  You have cuts or sores on your feet that do not heal. Get help right away if:  Your arm or leg turns cold, numb, and blue.  Your arms or legs become red, warm, swollen, painful, or numb.  You have chest pain.  You have trouble breathing.  You suddenly have weakness in your face, arm, or leg.  You become very  confused or you cannot speak.  You suddenly have a very bad headache.  You suddenly cannot see. Summary  Peripheral vascular disease (PVD) is a disease of the blood vessels.  A simple term for PVD is poor circulation. Without treatment, PVD tends to get worse.  Treatment may include exercise, low fat and low cholesterol diet, and quitting smoking. This information is not intended to replace advice given to you by your health care provider. Make sure you discuss any questions you have with your health care provider. Document Revised: 08/16/2017 Document Reviewed: 10/11/2016 Elsevier Patient Education  2020 Elsevier Inc.  

## 2020-06-28 NOTE — Assessment & Plan Note (Signed)
Hydrate and limit contrast if we do an angiogram.

## 2020-06-28 NOTE — Assessment & Plan Note (Signed)
Given his longstanding diabetes and other vascular risk, noninvasive studies were done today.  His right ABI is 1.39 with a normal digital pressure.  His left ABI is 1.34 although this may be falsely elevated due to medial calcification.  Digit pressures were not done on the left as he has had multiple toe amputations.  The waveforms on the right are biphasic to triphasic.  His left waveforms appear biphasic in the anterior tibial and monophasic but fairly strong monophasic in the posterior tibial.   This is a difficult situation.  His studies would indicate likely small vessel disease that may or may not be amenable to any revascularization.  I have discussed that an angiogram is still reasonable to consider further diagnostic abilities as well as potential treatment, but it is possible it would be diagnostic only if only very small vessel disease in the foot is present.  He is slated to see his podiatrist Thursday.  He wants to discuss this with his podiatrist which is certainly reasonable.  It would be reasonable to consider reamputation of the site and then see how things go with an angiogram as a backup option although I think an angiogram beforehand may be more helpful.  Either can be considered.  The patient is a very reasonable individual and I will reach out to his podiatrist to help coordinate the care plan going forward.

## 2020-06-28 NOTE — Assessment & Plan Note (Signed)
blood glucose control important in reducing the progression of atherosclerotic disease. Also, involved in wound healing. On appropriate medications.  

## 2020-06-28 NOTE — Assessment & Plan Note (Signed)
Postphlebitic swelling is present

## 2020-07-06 ENCOUNTER — Other Ambulatory Visit: Payer: Self-pay | Admitting: Podiatry

## 2020-07-06 ENCOUNTER — Encounter
Admission: RE | Admit: 2020-07-06 | Discharge: 2020-07-06 | Disposition: A | Discharge status: Home / Self Care | Payer: Medicare Other | Source: Ambulatory Visit | Attending: Podiatry | Admitting: Podiatry

## 2020-07-06 ENCOUNTER — Other Ambulatory Visit: Payer: Self-pay

## 2020-07-06 HISTORY — DX: Long term (current) use of anticoagulants: Z79.01

## 2020-07-06 HISTORY — DX: Unspecified atrial fibrillation: I48.91

## 2020-07-06 NOTE — Patient Instructions (Addendum)
Your procedure is scheduled on:07-08-20 FRIDAY Report to Day Surgery on the 2nd floor of the [facility]. To find out your arrival time, please call [phone] between 1PM - 3PM on:07-07-20 THURSDAY  REMEMBER: Instructions that are not followed completely may result in serious medical risk, up to and including death; or upon the discretion of your surgeon and anesthesiologist your surgery may need to be rescheduled.  Do not eat food after midnight the night before surgery.  No gum chewing, lozengers or hard candies.  You may however, drink WATER up to 2 hours before you are scheduled to arrive for your surgery. Do not drink anything within 2 hours of your scheduled arrival time.  Type 1 and Type 2 diabetics should only drink water.  In addition, your doctor has ordered for you to drink the provided  Gatorade G2 Drinking this carbohydrate drink up to two hours before surgery helps to reduce insulin resistance and improve patient outcomes. Please complete drinking 2 hours prior to scheduled arrival time.  TAKE THESE MEDICATIONS THE MORNING OF SURGERY WITH A SIP OF WATER: -ALLEGRA (FEXOFENADINE) -NEXIUM (ESOMEPARZOLE)-take one the night before and one on the morning of surgery - helps to prevent nausea after surgery.)  Stop Metformin 2 days prior to surgery-LAST DOSE NOW (07-06-20)   INSULIN PUMP AS USUAL  Follow recommendations from Cardiologist, Pulmonologist or PCP regarding stopping Aspirin, Coumadin, Plavix, Eliquis, Pradaxa, or Pletal-LAST DOSE OF COUMADIN WAS 07-04-20 Monday-LOVENOX BRIDGE TO BE STARTED EVENING OF 10-20, THEN LOVENOX INJECTION AM AND PM  ON 10-21, AND NO LOVENOX THE MORNING OF SURGERY.   One week prior to surgery: Stop Anti-inflammatories (NSAIDS) such as Advil, Aleve, Ibuprofen, Motrin, Naproxen, Naprosyn and Aspirin based products such as Excedrin, Goodys Powder, BC Powder-OK TO TAKE TYLENOL IF NEEDED  Stop ANY OVER THE COUNTER supplements until after  surgery. (You may continue taking multivitamin.)  No Alcohol for 24 hours before or after surgery.  No Smoking including e-cigarettes for 24 hours prior to surgery.  No chewable tobacco products for at least 6 hours prior to surgery.  No nicotine patches on the day of surgery.  Do not use any "recreational" drugs for at least a week prior to your surgery.  Please be advised that the combination of cocaine and anesthesia may have negative outcomes, up to and including death. If you test positive for cocaine, your surgery will be cancelled.  On the morning of surgery brush your teeth with toothpaste and water, you may rinse your mouth with mouthwash if you wish. Do not swallow any toothpaste or mouthwash.  Do not wear jewelry, make-up, hairpins, clips or nail polish.  Do not wear lotions, powders, or perfumes.   Do not shave 48 hours prior to surgery.   Contact lenses, hearing aids and dentures may not be worn into surgery.  Do not bring valuables to the hospital. [facility] is not responsible for any missing/lost belongings or valuables.   Use CHG Soap as directed on instruction sheet.  Bring your C-PAP to the hospital with you  Notify your doctor if there is any change in your medical condition (cold, fever, infection).  Wear comfortable clothing (specific to your surgery type) to the hospital.  Plan for stool softeners for home use; pain medications have a tendency to cause constipation. You can also help prevent constipation by eating foods high in fiber such as fruits and vegetables and drinking plenty of fluids as your diet allows.  After surgery, you can help  prevent lung complications by doing breathing exercises.  Take deep breaths and cough every 1-2 hours. Your doctor may order a device called an Incentive Spirometer to help you take deep breaths. When coughing or sneezing, hold a pillow firmly against your incision with both hands. This is called "splinting." Doing  this helps protect your incision. It also decreases belly discomfort.  If you are being admitted to the hospital overnight, leave your suitcase in the car. After surgery it may be brought to your room.  If you are being discharged the day of surgery, you will not be allowed to drive home. You will need a responsible adult (18 years or older) to drive you home and stay with you that night.   If you are taking public transportation, you will need to have a responsible adult (18 years or older) with you. Please confirm with your physician that it is acceptable to use public transportation.   Please call the [facility] at [phone] if you have any questions about these instructions.  Visitation Policy:  Patients undergoing a surgery or procedure may have one family member or support person with them as long as that person is not COVID-19 positive or experiencing its symptoms.  That person may remain in the waiting area during the procedure.  Inpatient Visitation Update:   In an effort to ensure the safety of our team members and our patients, we are implementing a change to our visitation policy:  Effective Monday, Aug. 9, at 7 a.m., inpatients will be allowed one support person.  o The support person may change daily.  o The support person must pass our screening, gel in and out, and wear a mask at all times, including in the patient's room.  o Patients must also wear a mask when staff or their support person are in the room.  o Masking is required regardless of vaccination status.  Systemwide, no visitors 17 or younger.

## 2020-07-06 NOTE — Pre-Procedure Instructions (Signed)
Progress Notes - documented in this encounter  Fath, Carl Borg, MD - 07/04/2020 10:15 AM EDT Formatting of this note is different from the original.   Chief Complaint: Chief Complaint  Patient presents with  . Pre-op Exam  07/08/2020 podiatry  Date of Service: 07/04/2020 Date of Birth: 1941/05/11 PCP: Marisue Ivan, MD  History of Present Illness: Mr. Hemp is a 79 y.o.male patient who presents for follow-up visit. He is doing fairly well from a cardiac standpoint but has lower extremity peripheral vascular disease. He is contemplating undergoing podiatric surgery this week. He has no chest pain. He walks on a elliptical trainer without any difficulty. He denies syncope or presyncope. He has been compliant with his medications. He is anticoagulated with warfarin. He recently has completed several courses of antibiotics. He has a history of paroxysmal atrial fibrillation, history of DVT, history of hypertension, history of coronary disease status post PCI in 2006. He has been very active until recently he has begun having knee pain. He has undergone 2 knee replacements and did well with these surgeries.. He denies shortness of breath with rest or with exertion. Denies a rapid or irregular heartbeat. Has noted no further problems with DVTs. Has been on warfarin and is tolerating this well. Electrocardiogram done recently at [facility] showed no significant abnormalities showing sinus rhythm. Past Medical and Surgical History  Past Medical History Past Medical History:  Diagnosis Date  . Adenomatous colon polyp, unspecified 05/05/2014  . Anemia  . Atrial fibrillation (CMS-HCC)  . Benign neoplasm of large bowel, unspecified  . BPH (benign prostatic hypertrophy)  . Chronic anticoagulation 05/05/2014  . Congenital foot deformity  . Coronary atherosclerosis of autologous vein bypass graft  . Coronary atherosclerosis of native coronary artery  . Diabetes mellitus type 2, uncomplicated (CMS-HCC)   . DVT (deep venous thrombosis) (CMS-HCC)  . Dyslipidemia  . GERD (gastroesophageal reflux disease)  . H/O adenomatous polyp of colon 08/18/2014  . Hyperlipidemia  . Hypersomnolence  with sleep apnea  . Hypertension  benign  . Melanoma in situ of scalp (CMS-HCC) 12/24/2014  . Microalbuminuria  stable  . Nephropathy  . Osteoarthrosis  . Pure hypercholesterolemia  . Renal insufficiency  . Retinopathy  . Sleep apnea  on CPAP   Past Surgical History He has a past surgical history that includes fusion L4-L5; Amputation Toe (Left); Amputation Toe (Left, 01/2003); Colonoscopy (07/27/2000); Colonoscopy (04/25/2009, 05/08/2002, 12/23/2014); egd (07/27/2000); Cataract extraction (Left, 08/2014); Cataract extraction (Right, 01/16); melanoma excision (12/24/2014); Colonoscopy (12/23/2014); Right TKA using all cemented biomet vanguard system with a 75 mm PCR femur an 83 mm tibial tray with a 10 mm AS E-poly insert and a 37 x 10 mm all poly 3 pegged domed patella (Right, 07/15/2018); One stage revision right TKA using all cemented biomet 360 revision knee system with a 75 mm PS femur with a 120 mm stem a 79 mm tibial tray with a 120 mm stem a 20 mm PS insert and a 37 x 10 mm all poly 3 pegged domed patella (Right, 05/05/2019); and Left unicondylar knee arthroplasty (Left, 02/09/2020).   Medications and Allergies  Current Medications  Current Outpatient Medications  Medication Sig Dispense Refill  . atorvastatin (LIPITOR) 80 MG tablet TAKE 1 TABLET BY MOUTH EVERY DAY 90 tablet 1  . blood glucose meter kit Use as directed 1 each 0  . DEXCOM G6 RECEIVER Misc Use to monitor blood sugar. 1 each 0  . DEXCOM G6 SENSOR Devi Use to monitor blood sugar. Replace  every 10 days 3 Device 12  . DEXCOM G6 TRANSMITTER Devi Use to monitor blood sugar. Replace every 3 months 1 Device 4  . DOCUSATE CALCIUM (STOOL SOFTENER ORAL) Take 1 tablet by mouth once daily.  Marland Kitchen esomeprazole (NEXIUM) 40 MG DR capsule Take 1  capsule (40 mg total) by mouth once daily 90 capsule 1  . fexofenadine (ALLEGRA) 180 MG tablet TAKE 1 TABLET BY MOUTH ONCE A DAY 90 tablet 1  . glucagon (BAQSIMI) 3 mg/actuation nasal spray 1 spray by Intranasal route as needed (in case of severe hypoglycemia) 2 each 1  . insulin GLARGINE (LANTUS) injection (concentration 100 units/mL) Inject 50 Units subcutaneously once daily when off pump 10 mL 1  . insulin LISPRO (HUMALOG) injection (concentration 100 units/mL) INJECT UP TO 150 UNITS UNDER THE SKIN VIA INSULIN PUMP PER DAY 50 mL 5  . metFORMIN (GLUCOPHAGE) 500 MG tablet TAKE 1 TABLET(500 MG) BY MOUTH TWICE DAILY WITH MEALS 180 tablet 1  . mometasone (ELOCON) 0.1 % lotion APPLY TO THE AFFECTED AREA EVERY DAY 30 mL 1  . MULTIVITAMIN ORAL Take 1 tablet by mouth once daily.  Letta Pate DELICA LANCETS 33 gauge Misc TEST 5 TIMES DAILY 3  . ONETOUCH ULTRA BLUE TEST STRIP test strip by XX route 2 (two) times daily Use as instructed. E10.42 100 each 6  . ramipriL (ALTACE) 2.5 MG capsule TAKE 1 CAPSULE(2.5 MG) BY MOUTH EVERY DAY 90 capsule 1  . silver sulfADIAZINE (SSD) 1 % cream Apply topically once daily 90 g 1  . UNABLE TO FIND Inhale into the lungs CPAP at night  . warfarin (COUMADIN) 3 MG tablet Take 1 tablet (3 mg total) by mouth once daily (Patient taking differently: Take 3 mg by mouth as directed (4 mg daily) ) 90 tablet 0  . amoxicillin-clavulanate (AUGMENTIN) 875-125 mg tablet Take 1 tablet (875 mg total) by mouth every 12 (twelve) hours for 10 days 20 tablet 0   No current facility-administered medications for this visit.   Allergies: Patient has no known allergies.  Social and Family History  Social History reports that he has never smoked. He has never used smokeless tobacco. He reports current alcohol use. He reports that he does not use drugs.  Family History Family History  Problem Relation Age of Onset  . Leukemia Other  Wife  . Cancer Mother  . Alzheimer's disease Father  .  Breast cancer Sister  . No Known Problems Brother  . No Known Problems Sister  . No Known Problems Son  . No Known Problems Daughter   Review of Systems  Review of Systems  Constitutional: Negative for chills, diaphoresis, fever, malaise/fatigue and weight loss.  HENT: Negative for congestion, ear discharge, hearing loss and tinnitus.  Eyes: Negative for blurred vision.  Respiratory: Negative for cough, hemoptysis, sputum production, shortness of breath and wheezing.  Cardiovascular: Negative for chest pain, palpitations, orthopnea, claudication, leg swelling and PND.  Gastrointestinal: Negative for abdominal pain, blood in stool, constipation, diarrhea, heartburn, melena, nausea and vomiting.  Genitourinary: Negative for dysuria, frequency, hematuria and urgency.  Musculoskeletal: Positive for joint pain. Negative for back pain, falls and myalgias.  Skin: Negative for itching and rash.  Neurological: Negative for dizziness, tingling, focal weakness, loss of consciousness, weakness and headaches.  Endo/Heme/Allergies: Negative for polydipsia. Does not bruise/bleed easily.  Psychiatric/Behavioral: Negative for depression, memory loss and substance abuse. The patient is not nervous/anxious.   Physical Examination   Vitals:BP 130/84  Pulse 89  Resp  16  Ht 190.5 cm (6\' 3" )  Wt (!) 103.4 kg (228 lb)  SpO2 98%  BMI 28.50 kg/m  Ht:190.5 cm (6\' 3" ) Wt:(!) 103.4 kg (228 lb) ZOX:WRUE surface area is 2.34 meters squared. Body mass index is 28.5 kg/m.  Wt Readings from Last 3 Encounters:  07/04/20 (!) 103.4 kg (228 lb)  06/30/20 (!) 103 kg (227 lb)  06/23/20 (!) 103 kg (227 lb 1.2 oz)   BP Readings from Last 3 Encounters:  07/04/20 130/84  05/13/20 118/60  05/12/20 130/70   General appearance appears in no acute distress  Head Mouth and Eye exam Normocephalic, without obvious abnormality, atraumatic Dentition is good Eyes appear anicteric       LUNGS Breath Sounds:  Normal Percussion: Normal  CARDIOVASCULAR JVP CV wave: no HJR: no Elevation at 90 degrees: None Carotid Pulse: normal pulsation bilaterally Bruit: None Apex: apical impulse normal  Auscultation Rhythm: normal sinus rhythm S1: normal S2: normal Clicks: no Rub: no Murmurs: no murmurs  Gallop: None  EXTREMITIES Clubbing: no Edema: trace to 1+ bilateral pedal edema on the right and left not assessable due to boot in place. Pulses: peripheral pulses symmetrical Femoral Bruits: no Amputation: Status post several left lower extremity toe amputations. SKIN Rash: no Cyanosis: no Embolic phemonenon: no Bruising: no NEURO Alert and Oriented to person, place and time: yes Non focal: yes  PSYCH: Pt appears to have normal affect  LABS REVIEWED Last 3 CBC results: Lab Results  Component Value Date  WBC 7.4 03/01/2020  WBC 7.0 08/25/2019  WBC 8.3 04/03/2019   Lab Results  Component Value Date  HGB 11.6 (L) 03/01/2020  HGB 11.8 (L) 08/25/2019  HGB 12.8 (L) 04/03/2019   Lab Results  Component Value Date  HCT 37.0 (L) 03/01/2020  HCT 38.0 (L) 08/25/2019  HCT 41.3 04/03/2019   Lab Results  Component Value Date  PLT 255 03/01/2020  PLT 184 08/25/2019  PLT 250 04/03/2019   Lab Results  Component Value Date  CREATININE 1.3 03/01/2020  BUN 25 03/01/2020  NA 141 03/01/2020  K 4.7 03/01/2020  CL 108 03/01/2020  CO2 28.4 03/01/2020   Lab Results  Component Value Date  HGBA1C 7.0 (H) 03/30/2020   Lab Results  Component Value Date  HDL 37.6 03/01/2020  HDL 39.5 08/25/2019  HDL 34.0 03/10/2019   Lab Results  Component Value Date  LDLCALC 78 03/01/2020  LDLCALC 56 08/25/2019  LDLCALC 68 03/10/2019   Lab Results  Component Value Date  TRIG 69 03/01/2020  TRIG 78 08/25/2019  TRIG 85 03/10/2019   Lab Results  Component Value Date  ALT 22 03/01/2020  AST 19 03/01/2020  ALKPHOS 76 03/01/2020   No results found for: TSH  Diagnostic Studies  Reviewed:  EKG EKG demonstrated normal sinus rhythm, nonspecific ST and T waves changes.  Assessment and Plan   79 y.o. male with  ICD-10-CM ICD-9-CM  1. Atherosclerosis of native coronary artery of native heart with stable angina pectoris (CMS-HCC)-status post PCI greater than 10 years ago. Is doing well. No ischemia with activity which is fairly vigorous. He is able to ambulate and exercise fairly vigorously despite his lower extremity abnormalities. Low risk for surgery. Would proceed with routine cardiopulmonary monitoring. No further cardiac work-up indicated. Patient is optimized for surgery. Okay to stop warfarin 5 days prior to the procedure. Should resume as soon as possible postop. I25.118 414.01  413.9  2. Sleep apnea, unspecified type G47.30 780.57  3. Type I diabetes mellitus with  nephropathy (CMS-HCC)-continue with insulin pump. E10.21 250.41  583.81  4. History of DVT-he is on warfarin. INR goal between 2 and 3. We will continue to follow.  Return in about 1 year (around 07/04/2021).  These notes generated with voice recognition software. I apologize for typographical errors.  Denton Ar, MD    Electronically signed by Denton Ar, MD at 07/04/2020 10:48 AM EDT  Plan of Treatment - documented as of this encounter

## 2020-07-07 ENCOUNTER — Encounter: Payer: Self-pay | Admitting: Podiatry

## 2020-07-07 ENCOUNTER — Other Ambulatory Visit
Admission: RE | Admit: 2020-07-07 | Discharge: 2020-07-07 | Disposition: A | Discharge status: Home / Self Care | Payer: Medicare Other | Source: Ambulatory Visit | Attending: Podiatry | Admitting: Podiatry

## 2020-07-07 DIAGNOSIS — Z01818 Encounter for other preprocedural examination: Secondary | ICD-10-CM | POA: Diagnosis present

## 2020-07-07 DIAGNOSIS — Z20822 Contact with and (suspected) exposure to covid-19: Secondary | ICD-10-CM | POA: Diagnosis not present

## 2020-07-07 LAB — BASIC METABOLIC PANEL
Anion gap: 8 (ref 5–15)
BUN: 25 mg/dL — ABNORMAL HIGH (ref 8–23)
CO2: 23 mmol/L (ref 22–32)
Calcium: 8.6 mg/dL — ABNORMAL LOW (ref 8.9–10.3)
Chloride: 106 mmol/L (ref 98–111)
Creatinine, Ser: 1.35 mg/dL — ABNORMAL HIGH (ref 0.61–1.24)
GFR, Estimated: 54 mL/min — ABNORMAL LOW (ref >60)
Glucose, Bld: 221 mg/dL — ABNORMAL HIGH (ref 70–99)
Potassium: 4.7 mmol/L (ref 3.5–5.1)
Sodium: 137 mmol/L (ref 135–145)

## 2020-07-07 LAB — CBC
HCT: 35.1 % — ABNORMAL LOW (ref 39.0–52.0)
Hemoglobin: 11.1 g/dL — ABNORMAL LOW (ref 13.0–17.0)
MCH: 27.8 pg (ref 26.0–34.0)
MCHC: 31.6 g/dL (ref 30.0–36.0)
MCV: 87.8 fL (ref 80.0–100.0)
Platelets: 193 10*3/uL (ref 150–400)
RBC: 4 MIL/uL — ABNORMAL LOW (ref 4.22–5.81)
RDW: 16.4 % — ABNORMAL HIGH (ref 11.5–15.5)
WBC: 5.6 10*3/uL (ref 4.0–10.5)
nRBC: 0 % (ref 0.0–0.2)

## 2020-07-07 LAB — SARS CORONAVIRUS 2 (TAT 6-24 HRS): SARS Coronavirus 2: NEGATIVE

## 2020-07-07 MED ORDER — ORAL CARE MOUTH RINSE: 15.0000 mL | Freq: Once | OROMUCOSAL | Status: AC

## 2020-07-07 MED ORDER — CEFAZOLIN SODIUM-DEXTROSE 2-4 GM/100ML-% IV SOLN
2.0000 g | INTRAVENOUS | Status: AC
  Administered 2020-07-08: 2 g via INTRAVENOUS

## 2020-07-07 MED ORDER — CHLORHEXIDINE GLUCONATE 0.12 % MT SOLN: 15.0000 mL | Freq: Once | OROMUCOSAL | Status: AC

## 2020-07-07 MED ORDER — SODIUM CHLORIDE 0.9 % IV SOLN: INTRAVENOUS | Status: DC

## 2020-07-07 MED ORDER — POVIDONE-IODINE 10 % EX SWAB: 2.0000 "application " | Freq: Once | CUTANEOUS | Status: DC

## 2020-07-07 NOTE — Pre-Procedure Instructions (Signed)
CALLED Carl Yang, DIABETIC COORDINATOR, AND INFORMED HER THAT PT HAS AN INSULIN PUMP AND IS SCHEDULED FOR SURGERY WITH [place] TOMORROW. Carl Yang STATES THEY WILL KEEP AN EYE OUT FOR HIS GLUCOSE AND WILL SEE IF HE STAYS OVERNIGHT AND IF SO WILL FOLLOW [place]

## 2020-07-07 NOTE — Progress Notes (Signed)
[facility] Perioperative Services  Pre-Admission/Anesthesia Testing Clinical Review  Date: 07/07/20  Patient Demographics:  Name: Carl Yang DOB:   November 15, 1940 MRN:   [id number]  Planned Surgical Procedure(s):    Case: [id number] Date/Time: 07/08/20 0845   Procedure: EXCISION BONE METATARSAL LEFT (Left )   Anesthesia type: Choice   Pre-op diagnosis: L97.525 ULCER LEFT FOOT   Location: [facility] OR ROOM 03 / [facility]S FOR ANESTH[facility]IA [facility]   Surgeons: Gwyneth Revels, DPM     NOTE: Available PAT nursing documentation and vital signs have been reviewed. Clinical nursing staff has updated patient's PMH/PSHx, current medication list, and drug allergies/intolerances to ensure comprehensive history available to assist in medical decision making as it pertains to the aforementioned surgical procedure and anticipated anesthetic course.   Clinical Discussion:  Carl Yang is a 79 y.o. male who is submitted for pre-surgical anesthesia review and clearance prior to him undergoing the above procedure. Patient has never been a smoker. Pertinent PMH includes: CAD (s/p PCI and stent placement), MI x2 (1998 and 2003), A. fib, PVD, DVT, HTN, HLD, T1DM (on insulin pump), OSAH (requires nocturnal PAP therapy), dyspnea, GERD (on daily PPI), anemia of chronic disease, OA, peripheral neuropathy, diabetic retinopathy.  Patient is followed by cardiology Lady Gary, MD). He was last seen in the cardiology clinic on 07/04/2020; notes reviewed.  At the time of his last clinic visit, patient was reported to be doing "fairly well" from a cardiac perspective.  He denied any chest pain, significant shortness of breath, PND, orthopnea, palpitations, vertiginous symptoms, presyncope/syncope.  Patient with lower extremity peripheral vascular disease and has plans to undergo podiatric surgery.  Patient's hypertension well controlled on ACEi monotherapy.  He is on a statin for his HLD.  Patient chronically  anticoagulated (warfarin) for his A. fib.  Last TTE done in 03/2006 revealed a LV systolic function with an LVEF of >55%.  Cardiac catheterization performed in 07/2012 revealed two 95% lesions to the mid LAD, and a 75% lesion to the D2; lesions were stented.  I do not see evidence of any further cardiovascular testing. Patient with a functional capacity of >4 METS; activity described as "fairly vigorous". Patient scheduled to follow up with outpatient cardiology in 1 year.   Patient scheduled to undergo podiatric surgery on 07/08/2020 with Dr. Gwyneth Revels.  Patient with an infected ulcer on his foot.  Patient with T1 DM diagnosis.  He was seen by endocrinology on 07/05/2020 and changes were made to his medication regimen.  Given the current infected nature of his wound, both podiatry and endocrinology recommending that patient proceed with surgery despite current glycemic control; hemoglobin A1c 8.3%. Per cardiology, "this patient is optimized for surgery and may proceed with the planned procedural course with a LOW risk stratification. Would proceed with routine cardiopulmonary monitoring. No further cardiac work-up indicated".  This patient is on daily anticoagulation therapy. He has been instructed on recommendations for holding his warfarin for 5 days prior to his procedure. The patient's last dose of his anticoagulant was taken on 07/02/2020.   He reports previous perioperative complications, however is unsure if they are related to his anesthetic course. He reports that after having surgical procedure at [facility] he "coded" during the night. He underwent a general anesthetic course here (ASA III) in 01/2020 with no documented complications. Of note, patient has insulin pump in place. Diabetes coordinator has been made aware. Recommendations are to leave in place during procedure.   Vitals with BMI 06/28/2020  02/12/2020 02/11/2020  Height 6\' 3"  - -  Weight 228 lbs - -  BMI 28.5 - -  Systolic 148 133 962    Diastolic 74 78 62  Pulse 43 82 80    Providers/Specialists:   NOTE: Primary physician provider listed below. Patient may have been seen by APP or partner within same practice.   PROVIDER ROLE LAST OV  No att. providers found Podiatry (Surgeon) 06/30/2020  Marisue Ivan, MD Primary Care Provider 05/12/2020  Harold Hedge, MD Cardiology 07/04/2020   Allergies:  Patient has no known allergies.  Current Home Medications:   . atorvastatin (LIPITOR) 80 MG tablet  . docusate sodium (COLACE) 100 MG capsule  . enoxaparin (LOVENOX) 40 MG/0.4ML injection  . esomeprazole (NEXIUM) 40 MG capsule  . fexofenadine (ALLEGRA) 180 MG tablet  . HUMALOG 100 UNIT/ML injection  . metFORMIN (GLUCOPHAGE) 500 MG tablet  . Multiple Vitamin (MULTIVITAMIN WITH MINERALS) TABS tablet  . ramipril (ALTACE) 2.5 MG capsule  . enoxaparin (LOVENOX) 40 MG/0.4ML injection  . Insulin Human (INSULIN PUMP) SOLN  . oxyCODONE (OXY IR/ROXICODONE) 5 MG immediate release tablet  . traMADol (ULTRAM) 50 MG tablet  . warfarin (COUMADIN) 3 MG tablet  . warfarin (COUMADIN) 4 MG tablet   No current facility-administered medications for this encounter.   History:   Past Medical History:  Diagnosis Date  . Arthritis   . Atrial fibrillation (HCC)   . Cancer (HCC)    Melanoma  . Chronic anticoagulation    Warfarin  . Complication of anesthesia    After surgery at [facility], the patient coded during the night.  . Coronary artery disease    Stents  . Diabetes mellitus without complication (HCC)   . DVT (deep venous thrombosis) (HCC)    Right lower limb  . Dyspnea   . GERD (gastroesophageal reflux disease)   . Insulin pump in place   . Myocardial infarction (HCC)    1998, 2003  . Neuromuscular disorder (HCC)    Peripheral Neuropathy r/t DM  . Peripheral vascular disease (HCC)    Diabetes  . Sleep apnea    Uses CPAP   Past Surgical History:  Procedure Laterality Date  . AMPUTATION TOE Left    All 5 toes on  the left foot amputated.  Marland Kitchen AMPUTATION TOE Left 10/09/2018   Procedure: TOE IPJ 2ND TOE LEFT;  Surgeon: Recardo Evangelist, DPM;  Location: [facility];  Service: Podiatry;  Laterality: Left;  IVA LOCAL Diabeticv - insulin pump  . BACK SURGERY     L4 & L5 Fusion  . CARDIAC CATHETERIZATION  2003  . CORONARY ANGIOPLASTY WITH STENT PLACEMENT     stents x 2  . DIAGNOSTIC LAPAROSCOPY    . EYE SURGERY Bilateral    cataracts  . PARTIAL KNEE ARTHROPLASTY Left 02/09/2020   Procedure: UNICOMPARTMENTAL KNEE;  Surgeon: Christena Flake, MD;  Location: [facility] ORS;  Service: Orthopedics;  Laterality: Left;  . TONSILLECTOMY    . TOTAL KNEE ARTHROPLASTY Right 07/15/2018   Procedure: TOTAL KNEE ARTHROPLASTY;  Surgeon: Christena Flake, MD;  Location: [facility] ORS;  Service: Orthopedics;  Laterality: Right;  . TOTAL KNEE REVISION Right 05/05/2019   Procedure: TOTAL KNEE REVISION;  Surgeon: Christena Flake, MD;  Location: [facility] ORS;  Service: Orthopedics;  Laterality: Right;   History reviewed. No pertinent family history. Social History   Tobacco Use  . Smoking status: Never Smoker  . Smokeless tobacco: Never Used  Vaping Use  . Vaping Use: Never  used  Substance Use Topics  . Alcohol use: Yes    Alcohol/week: 1.0 standard drink    Types: 1 Glasses of wine per week    Comment: Socially  . Drug use: Never    Pertinent Clinical Results:  LABS: Labs reviewed: Acceptable for surgery.  Hospital Outpatient Visit on 07/07/2020  Component Date Value Ref Range Status  . Sodium 07/07/2020 137  135 - 145 mmol/L Final  . Potassium 07/07/2020 4.7  3.5 - 5.1 mmol/L Final  . Chloride 07/07/2020 106  98 - 111 mmol/L Final  . CO2 07/07/2020 23  22 - 32 mmol/L Final  . Glucose, Bld 07/07/2020 221* 70 - 99 mg/dL Final   Glucose reference range applies only to samples taken after fasting for at least 8 hours.  . BUN 07/07/2020 25* 8 - 23 mg/dL Final  . Creatinine, Ser 07/07/2020 1.35* 0.61 - 1.24 mg/dL Final  .  Calcium 29/52/8413 8.6* 8.9 - 10.3 mg/dL Final  . GFR, Estimated 07/07/2020 54* >60 mL/min Final   Comment: (NOTE) Calculated using the CKD-EPI Creatinine Equation (2021)   . Anion gap 07/07/2020 8  5 - 15 Final   Performed at [facility], [address] [place], [place] [address]  . WBC 07/07/2020 5.6  4.0 - 10.5 K/uL Final  . RBC 07/07/2020 4.00* 4.22 - 5.81 MIL/uL Final  . Hemoglobin 07/07/2020 11.1* 13.0 - 17.0 g/dL Final  . HCT 02/72/5366 35.1* 39 - 52 % Final  . MCV 07/07/2020 87.8  80.0 - 100.0 fL Final  . MCH 07/07/2020 27.8  26.0 - 34.0 pg Final  . MCHC 07/07/2020 31.6  30.0 - 36.0 g/dL Final  . RDW 44/11/4740 16.4* 11.5 - 15.5 % Final  . Platelets 07/07/2020 193  150 - 400 K/uL Final  . nRBC 07/07/2020 0.0  0.0 - 0.2 % Final   Performed at [facility], [address] [place], [place] [address]    ECG: Date: 02/01/2020 Time ECG obtained: 1009 AM Rate: 92 bpm Rhythm: normal sinus Axis (leads I and aVF): Normal Intervals: PR 176 ms. QRS 90 ms. QTc 427 ms. ST segment and T wave changes: Nonspecific ST abnormality  Comparison: Similar to previous tracing obtained on 05/05/2019   IMAGING / PROCEDURES: ECHOCARDIOGRAM done on 04/03/2006 1. LVEF >55% 2. Normal LV systolic function 3. Diastolic function class: Relaxation abnormality (grade 1) corresponds to E/A reversal 4. No valvular regurgitation 5. No valvular stenosis  LEFT HEART CATHETERIZATION done on 12/09/1997 1. Lesion #1:  Mid LAD 95%  (pre)  to <25%  (post)  2. Lesion #2:  Mid LAD 95%  (pre)  to <25%  (post) 3. Lesion #3:  D2 75%  (pre)  to <25%  (post)   Impression and Plan:  Carl Yang has been referred for pre-anesthesia review and clearance prior to him undergoing the planned anesthetic and procedural courses. Available labs, pertinent testing, and imaging results were personally reviewed by me. This patient has been appropriately cleared by cardiology. Diabetes coordinator  aware of patient having procedure, as he has an insulin pump in place.    Based on clinical review performed today (07/07/20), barring any significant acute changes in the patient's overall condition, it is anticipated that he will be able to proceed with the planned surgical intervention. Any acute changes in clinical condition may necessitate his procedure being postponed and/or cancelled. Pre-surgical instructions were reviewed with the patient during his PAT appointment and questions were fielded by PAT clinical  staff.  Quentin Mulling, MSN, APRN, FNP-C, CEN [facility]  Peri-operative Services Nurse Practitioner Phone: [phone] 07/07/20 4:31 PM  NOTE: This note has been prepared using Dragon dictation software. Despite my best ability to proofread, there is always the potential that unintentional transcriptional errors may still occur from this process.

## 2020-07-08 ENCOUNTER — Ambulatory Visit: Payer: Medicare Other

## 2020-07-08 ENCOUNTER — Ambulatory Visit
Admission: RE | Admit: 2020-07-08 | Discharge: 2020-07-08 | Disposition: A | Discharge status: Home / Self Care | Payer: Medicare Other | Attending: Podiatry | Admitting: Podiatry

## 2020-07-08 ENCOUNTER — Ambulatory Visit: Payer: Medicare Other | Admitting: Anesthesiology

## 2020-07-08 ENCOUNTER — Ambulatory Visit: Payer: Medicare Other | Admitting: Urgent Care

## 2020-07-08 ENCOUNTER — Encounter
Admission: RE | Disposition: A | Discharge status: Home / Self Care | Payer: Self-pay | Source: Home / Self Care | Attending: Podiatry

## 2020-07-08 ENCOUNTER — Other Ambulatory Visit: Payer: Self-pay

## 2020-07-08 ENCOUNTER — Encounter: Payer: Self-pay | Admitting: Podiatry

## 2020-07-08 DIAGNOSIS — E10621 Type 1 diabetes mellitus with foot ulcer: Secondary | ICD-10-CM | POA: Insufficient documentation

## 2020-07-08 DIAGNOSIS — Z794 Long term (current) use of insulin: Secondary | ICD-10-CM | POA: Diagnosis not present

## 2020-07-08 DIAGNOSIS — L97526 Non-pressure chronic ulcer of other part of left foot with bone involvement without evidence of necrosis: Secondary | ICD-10-CM | POA: Diagnosis not present

## 2020-07-08 DIAGNOSIS — E1042 Type 1 diabetes mellitus with diabetic polyneuropathy: Secondary | ICD-10-CM | POA: Insufficient documentation

## 2020-07-08 DIAGNOSIS — Z7901 Long term (current) use of anticoagulants: Secondary | ICD-10-CM | POA: Diagnosis not present

## 2020-07-08 DIAGNOSIS — Z79899 Other long term (current) drug therapy: Secondary | ICD-10-CM | POA: Diagnosis not present

## 2020-07-08 DIAGNOSIS — Z9641 Presence of insulin pump (external) (internal): Secondary | ICD-10-CM | POA: Insufficient documentation

## 2020-07-08 HISTORY — PX: BONE EXCISION: SHX6730

## 2020-07-08 HISTORY — DX: Presence of insulin pump (external) (internal): Z96.41

## 2020-07-08 LAB — GLUCOSE, CAPILLARY
Glucose-Capillary: 149 mg/dL — ABNORMAL HIGH (ref 70–99)
Glucose-Capillary: 141 mg/dL — ABNORMAL HIGH (ref 70–99)

## 2020-07-08 LAB — PROTIME-INR
INR: 1.1 (ref 0.8–1.2)
Prothrombin Time: 13.4 seconds (ref 11.4–15.2)

## 2020-07-08 SURGERY — BONE EXCISION
Anesthesia: General | Laterality: Left

## 2020-07-08 MED ORDER — PROPOFOL 10 MG/ML IV BOLUS
INTRAVENOUS | Status: DC | PRN
  Administered 2020-07-08: 75 ug/kg/min via INTRAVENOUS

## 2020-07-08 MED ORDER — PROPOFOL 500 MG/50ML IV EMUL
INTRAVENOUS | Status: AC
  Filled 2020-07-08: qty 50

## 2020-07-08 MED ORDER — ONDANSETRON HCL 4 MG/2ML IJ SOLN
INTRAMUSCULAR | Status: DC | PRN
  Administered 2020-07-08: 4 mg via INTRAVENOUS

## 2020-07-08 MED ORDER — ONDANSETRON HCL 4 MG PO TABS: 4.0000 mg | ORAL_TABLET | Freq: Four times a day (QID) | ORAL | Status: DC | PRN

## 2020-07-08 MED ORDER — ONDANSETRON HCL 4 MG/2ML IJ SOLN: 4.0000 mg | Freq: Once | INTRAMUSCULAR | Status: DC | PRN

## 2020-07-08 MED ORDER — PROPOFOL 500 MG/50ML IV EMUL
INTRAVENOUS | Status: DC | PRN
  Administered 2020-07-08: 20 mg via INTRAVENOUS

## 2020-07-08 MED ORDER — CHLORHEXIDINE GLUCONATE 0.12 % MT SOLN
OROMUCOSAL | Status: AC
  Administered 2020-07-08: 15 mL via OROMUCOSAL
  Filled 2020-07-08: qty 15

## 2020-07-08 MED ORDER — FENTANYL CITRATE (PF) 100 MCG/2ML IJ SOLN
INTRAMUSCULAR | Status: DC | PRN
  Administered 2020-07-08: 25 ug via INTRAVENOUS

## 2020-07-08 MED ORDER — TRAMADOL HCL 50 MG PO TABS
50.0000 mg | ORAL_TABLET | Freq: Four times a day (QID) | ORAL | 0 refills | Status: AC | PRN
Start: 2020-07-08 — End: 2021-07-08

## 2020-07-08 MED ORDER — BUPIVACAINE HCL (PF) 0.5 % IJ SOLN
INTRAMUSCULAR | Status: AC
  Filled 2020-07-08: qty 30

## 2020-07-08 MED ORDER — OXYCODONE HCL 5 MG PO TABS: 5.0000 mg | ORAL_TABLET | Freq: Once | ORAL | Status: DC | PRN

## 2020-07-08 MED ORDER — BUPIVACAINE HCL (PF) 0.5 % IJ SOLN
INTRAMUSCULAR | Status: DC | PRN
  Administered 2020-07-08: 5 mL

## 2020-07-08 MED ORDER — BUPIVACAINE LIPOSOME 1.3 % IJ SUSP
INTRAMUSCULAR | Status: AC
  Filled 2020-07-08: qty 20

## 2020-07-08 MED ORDER — CEFAZOLIN SODIUM-DEXTROSE 2-4 GM/100ML-% IV SOLN
INTRAVENOUS | Status: AC
  Filled 2020-07-08: qty 100

## 2020-07-08 MED ORDER — FENTANYL CITRATE (PF) 100 MCG/2ML IJ SOLN
INTRAMUSCULAR | Status: AC
  Filled 2020-07-08: qty 2

## 2020-07-08 MED ORDER — ONDANSETRON HCL 4 MG/2ML IJ SOLN: 4.0000 mg | Freq: Four times a day (QID) | INTRAMUSCULAR | Status: DC | PRN

## 2020-07-08 MED ORDER — LIDOCAINE HCL (PF) 1 % IJ SOLN
INTRAMUSCULAR | Status: DC | PRN
  Administered 2020-07-08: 5 mL

## 2020-07-08 MED ORDER — FENTANYL CITRATE (PF) 100 MCG/2ML IJ SOLN: 25.0000 ug | INTRAMUSCULAR | Status: DC | PRN

## 2020-07-08 SURGICAL SUPPLY — 55 items
BLADE MED AGGRESSIVE (BLADE) ×2 IMPLANT
BLADE OSC/SAGITTAL MD 5.5X18 (BLADE) IMPLANT
BLADE SURG 15 STRL LF DISP TIS (BLADE) ×2 IMPLANT
BLADE SURG 15 STRL SS (BLADE) ×4
BLADE SURG MINI STRL (BLADE) ×2 IMPLANT
BNDG CMPR STD VLCR NS LF 5.8X4 (GAUZE/BANDAGES/DRESSINGS) ×1
BNDG CMPR STD VLCR NS LF 5.8X6 (GAUZE/BANDAGES/DRESSINGS)
BNDG COHESIVE 4X5 TAN STRL (GAUZE/BANDAGES/DRESSINGS) ×2 IMPLANT
BNDG CONFORM 3 STRL LF (GAUZE/BANDAGES/DRESSINGS) ×2 IMPLANT
BNDG ELASTIC 4X5.8 VLCR NS LF (GAUZE/BANDAGES/DRESSINGS) ×2 IMPLANT
BNDG ELASTIC 6X5.8 VLCR NS LF (GAUZE/BANDAGES/DRESSINGS) IMPLANT
BNDG ESMARK 4X12 TAN STRL LF (GAUZE/BANDAGES/DRESSINGS) ×2 IMPLANT
BNDG GAUZE 4.5X4.1 6PLY STRL (MISCELLANEOUS) ×2 IMPLANT
CANISTER SUCT 1200ML W/VALVE (MISCELLANEOUS) ×2 IMPLANT
COVER WAND RF STERILE (DRAPES) ×2 IMPLANT
CUFF TOURN SGL QUICK 12 (TOURNIQUET CUFF) IMPLANT
CUFF TOURN SGL QUICK 18X4 (TOURNIQUET CUFF) ×2 IMPLANT
DRAPE FLUOR MINI C-ARM 54X84 (DRAPES) ×2 IMPLANT
DURAPREP 26ML APPLICATOR (WOUND CARE) ×2 IMPLANT
ELECT REM PT RETURN 9FT ADLT (ELECTROSURGICAL) ×2
ELECTRODE REM PT RTRN 9FT ADLT (ELECTROSURGICAL) ×1 IMPLANT
GAUZE 4X4 16PLY RFD (DISPOSABLE) ×2 IMPLANT
GAUZE SPONGE 4X4 12PLY STRL (GAUZE/BANDAGES/DRESSINGS) ×2 IMPLANT
GAUZE XEROFORM 1X8 LF (GAUZE/BANDAGES/DRESSINGS) ×2 IMPLANT
GLOVE BIO SURGEON STRL SZ7.5 (GLOVE) ×2 IMPLANT
GLOVE INDICATOR 8.0 STRL GRN (GLOVE) ×2 IMPLANT
GOWN STRL REUS W/ TWL XL LVL3 (GOWN DISPOSABLE) ×2 IMPLANT
GOWN STRL REUS W/TWL XL LVL3 (GOWN DISPOSABLE) ×4
NEEDLE FILTER BLUNT 18X 1/2SAF (NEEDLE) ×1
NEEDLE FILTER BLUNT 18X1 1/2 (NEEDLE) ×1 IMPLANT
NEEDLE HYPO 22GX1.5 SAFETY (NEEDLE) ×2 IMPLANT
NS IRRIG 500ML POUR BTL (IV SOLUTION) ×2 IMPLANT
PACK EXTREMITY (MISCELLANEOUS) ×2 IMPLANT
PAD ABD DERMACEA PRESS 5X9 (GAUZE/BANDAGES/DRESSINGS) ×2 IMPLANT
PAD CAST CTTN 4X4 STRL (SOFTGOODS) IMPLANT
PAD PREP 24X41 OB/GYN DISP (PERSONAL CARE ITEMS) ×2 IMPLANT
PADDING CAST COTTON 4X4 STRL (SOFTGOODS)
PENCIL ELECTRO HAND CTR (MISCELLANEOUS) ×2 IMPLANT
PIN BALLS 3/8 F/.054-.062 WIRE (MISCELLANEOUS) IMPLANT
RASP SM TEAR CROSS CUT (RASP) IMPLANT
STOCKINETTE STRL 6IN 960660 (GAUZE/BANDAGES/DRESSINGS) ×2 IMPLANT
STRAP SAFETY 5IN WIDE (MISCELLANEOUS) ×2 IMPLANT
STRIP CLOSURE SKIN 1/4X4 (GAUZE/BANDAGES/DRESSINGS) IMPLANT
SUT ETH BLK MONO 3 0 FS 1 12/B (SUTURE) ×2 IMPLANT
SUT ETHILON 5-0 FS-2 18 BLK (SUTURE) IMPLANT
SUT MNCRL 4-0 (SUTURE) ×2
SUT MNCRL 4-0 27XMFL (SUTURE) ×1
SUT VIC AB 3-0 SH 27 (SUTURE) ×2
SUT VIC AB 3-0 SH 27X BRD (SUTURE) ×1 IMPLANT
SUTURE MNCRL 4-0 27XMF (SUTURE) ×1 IMPLANT
SYR 10ML LL (SYRINGE) ×2 IMPLANT
SYR 50ML LL SCALE MARK (SYRINGE) ×2 IMPLANT
SYR BULB IRRIG 60ML STRL (SYRINGE) ×2 IMPLANT
WIRE Z .045 C-WIRE SPADE TIP (WIRE) IMPLANT
WIRE Z .062 C-WIRE SPADE TIP (WIRE) IMPLANT

## 2020-07-08 NOTE — Anesthesia Preprocedure Evaluation (Addendum)
Anesthesia Evaluation  Patient identified by MRN, date of birth, ID band Patient awake    Reviewed: Allergy & Precautions, H&P , NPO status , Patient's Chart, lab work & pertinent test results  History of Anesthesia Complications Negative for: history of anesthetic complications  Airway Mallampati: II  TM Distance: <3 FB Neck ROM: full    Dental   Pulmonary sleep apnea and Continuous Positive Airway Pressure Ventilation , neg COPD,           Cardiovascular hypertension, (-) angina+ CAD, + Past MI, + Cardiac Stents and + Peripheral Vascular Disease  (-) dysrhythmias      Neuro/Psych negative neurological ROS  negative psych ROS   GI/Hepatic Neg liver ROS, GERD  Controlled,  Endo/Other  diabetes  Renal/GU Renal disease (CKD)  negative genitourinary   Musculoskeletal   Abdominal   Peds  Hematology negative hematology ROS (+)   Anesthesia Other Findings Past Medical History: No date: Arthritis No date: Atrial fibrillation (HCC) No date: Cancer ([place])     Comment:  Melanoma No date: Chronic anticoagulation     Comment:  Warfarin No date: Complication of anesthesia     Comment:  After surgery at [place], the patient coded during the               night. No date: Coronary artery disease     Comment:  Stents No date: Diabetes mellitus without complication (HCC) No date: DVT (deep venous thrombosis) (HCC)     Comment:  Right lower limb No date: Dyspnea No date: GERD (gastroesophageal reflux disease) No date: Insulin pump in place No date: Myocardial infarction [facility])     Comment:  1998, 2003 No date: Neuromuscular disorder ([place])     Comment:  Peripheral Neuropathy r/t DM No date: Peripheral vascular disease (HCC)     Comment:  Diabetes No date: Sleep apnea     Comment:  Uses CPAP  Past Surgical History: No date: AMPUTATION TOE; Left     Comment:  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 10/09/2018: AMPUTATION TOE;  Left     Comment:  Procedure: TOE IPJ 2ND TOE LEFT;  Surgeon: Albertine Patricia, DPM;  Location: [facility];  Service:               Podiatry;  Laterality: Left;  IVA LOCAL Diabeticv -               insulin pump No date: BACK SURGERY     Comment:  L4 & L5 Fusion 2003: CARDIAC CATHETERIZATION No date: CORONARY ANGIOPLASTY WITH STENT PLACEMENT     Comment:  stents x 2 No date: DIAGNOSTIC LAPAROSCOPY No date: EYE SURGERY; Bilateral     Comment:  cataracts 02/09/2020: PARTIAL KNEE ARTHROPLASTY; Left     Comment:  Procedure: UNICOMPARTMENTAL KNEE;  Surgeon: Corky Mull, MD;  Location: [facility];  Service: Orthopedics;                Laterality: Left; No date: TONSILLECTOMY 07/15/2018: TOTAL KNEE ARTHROPLASTY; Right     Comment:  Procedure: TOTAL KNEE ARTHROPLASTY;  Surgeon: Corky Mull, MD;  Location: [facility];  Service: Orthopedics;  Laterality: Right; 05/05/2019: TOTAL KNEE REVISION; Right     Comment:  Procedure: TOTAL KNEE REVISION;  Surgeon: Corky Mull,              MD;  Location: [facility];  Service: Orthopedics;                Laterality: Right;  BMI    Body Mass Index: 28.38 kg/m      Reproductive/Obstetrics negative OB ROS                            Anesthesia Physical Anesthesia Plan  ASA: III  Anesthesia Plan: General   Post-op Pain Management:    Induction:   PONV Risk Score and Plan: Propofol infusion  Airway Management Planned: Simple Face Mask  Additional Equipment:   Intra-op Plan:   Post-operative Plan:   Informed Consent: I have reviewed the patients History and Physical, chart, labs and discussed the procedure including the risks, benefits and alternatives for the proposed anesthesia with the patient or authorized representative who has indicated his/her understanding and acceptance.     Dental Advisory Given  Plan Discussed with: Anesthesiologist,  CRNA and Surgeon  Anesthesia Plan Comments:        Anesthesia Quick Evaluation

## 2020-07-08 NOTE — Op Note (Signed)
Operative note   Surgeon:Usman Millett Lawyer: None    Preop diagnosis: Full-thickness ulceration left 1st metatarsal bone    Postop diagnosis: Same    Procedure: Excision distal 1st metatarsal left foot    EBL: Minimal    Anesthesia:local and IV sedation    Hemostasis: Ankle tourniquet inflated to 200 mmHg for approximately 15 minutes    Specimen: Bone for pathology and deep wound culture    Complications: None    Operative indications:Carl Yang is an 79 y.o. that presents today for surgical intervention.  The risks/benefits/alternatives/complications have been discussed and consent has been given.    Procedure:  Patient was brought into the OR and placed on the operating table in thesupine position. After anesthesia was obtained theleft lower extremity was prepped and draped in usual sterile fashion.  Attention was directed to the distal aspect of the left 1st metatarsal where a full-thickness open ulceration was noted. This time an incision was made proximal and distal with excision of the entire ulceration full-thickness. Full-thickness incision taken down to the level of the bone. The distal aspect of the 1st metatarsal head was then exposed. With a power saw the distal 1st metatarsal head was excised. This was sent for pathological examination. A deep wound culture of the fluid was then taken at this time. The wound was then flushed with copious amounts of irrigation. Closure was then performed with a 3-0 Vicryl the deeper layer and a 3-0 nylon for the skin. A bulky sterile dressing was applied.    Patient tolerated the procedure and anesthesia well.  Was transported from the OR to the PACU with all vital signs stable and vascular status intact. To be discharged per routine protocol.  Will follow up in approximately 1 week in the outpatient clinic.

## 2020-07-08 NOTE — Transfer of Care (Signed)
Immediate Anesthesia Transfer of Care Note  Patient: Carl Yang  Procedure(s) Performed: EXCISION BONE METATARSAL LEFT (Left )  Patient Location: PACU  Anesthesia Type:MAC combined with regional for post-op pain  Level of Consciousness: awake, alert  and oriented  Airway & Oxygen Therapy: Patient Spontanous Breathing  Post-op Assessment: Report given to RN and Post -op Vital signs reviewed and stable  Post vital signs: stable  Last Vitals:  Vitals Value Taken Time  BP 112/53 07/08/20 0957  Temp 36.1 C 07/08/20 0957  Pulse 25 07/08/20 0959  Resp 16 07/08/20 0959  SpO2 100 % 07/08/20 0959  Vitals shown include unvalidated device data.  Last Pain:  Vitals:   07/08/20 0957  TempSrc:   PainSc: 0-No pain         Complications: No complications documented.

## 2020-07-08 NOTE — Discharge Instructions (Addendum)
[facility]  POST OPERATIVE INSTRUCTIONS FOR DR. TROXLER, DR. FOWLER, AND DR. [facility] PODIATRY DEPARTMENT   1. Take your medication as prescribed.  Pain medication should be taken only as needed.  2. Keep the dressing clean, dry and intact.  3. Keep your foot elevated above the heart level for the first 48 hours.  4. Walking to the bathroom and brief periods of walking are acceptable, unless we have instructed you to be non-weight bearing.  5. Always wear your post-op shoe when walking.  Always use your crutches if you are to be non-weight bearing.  6. Do not take a shower. Baths are permissible as long as the foot is kept out of the water.   7. Every hour you are awake:  - Bend your knee 15 times. - Flex foot 15 times - Massage calf 15 times  8. Call [facility] ([phone]) if any of the following problems occur: - You develop a temperature or fever. - The bandage becomes saturated with blood. - Medication does not stop your pain. - Injury of the foot occurs. - Any symptoms of infection including redness, odor, or red streaks running from wound.  AMBULATORY SURGERY  DISCHARGE INSTRUCTIONS  1) The drugs that you were given will stay in your system until tomorrow so for the next 24 hours you should not: A) Drive an automobile B) Make any legal decisions C) Drink any alcoholic beverage  2) You may resume regular meals tomorrow.  Today it is better to start with liquids and gradually work up to solid foods. You may eat anything you prefer, but it is better to start with liquids, then soup and crackers, and gradually work up to solid foods.  3) Please notify your doctor immediately if you have any unusual bleeding, trouble breathing, redness and pain at the surgery site, drainage, fever, or pain not relieved by medication.  4) Additional Instructions:   Please contact your physician with any problems or Same  Day Surgery at [phone], Monday through Friday 6 am to 4 pm, or [place] at  Main number at [phone]. 

## 2020-07-11 ENCOUNTER — Encounter: Payer: Self-pay | Admitting: Podiatry

## 2020-07-11 NOTE — Anesthesia Postprocedure Evaluation (Signed)
Anesthesia Post Note  Patient: Carl Yang  Procedure(s) Performed: EXCISION BONE METATARSAL LEFT (Left )  Patient location during evaluation: PACU Anesthesia Type: General Level of consciousness: awake and alert Pain management: pain level controlled Vital Signs Assessment: post-procedure vital signs reviewed and stable Respiratory status: spontaneous breathing, nonlabored ventilation and respiratory function stable Cardiovascular status: blood pressure returned to baseline and stable Postop Assessment: no apparent nausea or vomiting Anesthetic complications: no   No complications documented.   Last Vitals:  Vitals:   07/08/20 1027 07/08/20 1039  BP: (!) 144/62 (!) 142/68  Pulse:  88  Resp:  16  Temp:  (!) 36.2 C  SpO2:  100%    Last Pain:  Vitals:   07/08/20 1039  TempSrc: Temporal  PainSc: 0-No pain                 Brett Canales Isair Inabinet

## 2020-07-12 LAB — SURGICAL PATHOLOGY

## 2020-07-13 LAB — AEROBIC/ANAEROBIC CULTURE W GRAM STAIN (SURGICAL/DEEP WOUND)

## 2020-10-06 ENCOUNTER — Other Ambulatory Visit: Payer: Self-pay | Admitting: Internal Medicine

## 2020-10-06 ENCOUNTER — Other Ambulatory Visit (HOSPITAL_COMMUNITY): Payer: Self-pay | Admitting: Internal Medicine

## 2020-10-06 DIAGNOSIS — M25561 Pain in right knee: Secondary | ICD-10-CM

## 2020-10-06 DIAGNOSIS — M25511 Pain in right shoulder: Secondary | ICD-10-CM

## 2020-10-09 ENCOUNTER — Ambulatory Visit: Payer: Medicare Other

## 2020-10-09 ENCOUNTER — Ambulatory Visit: Admission: RE | Admit: 2020-10-09 | Payer: Medicare Other | Source: Ambulatory Visit

## 2020-10-12 ENCOUNTER — Other Ambulatory Visit: Payer: Self-pay

## 2020-10-12 ENCOUNTER — Ambulatory Visit
Admission: RE | Admit: 2020-10-12 | Discharge: 2020-10-12 | Disposition: A | Discharge status: Home / Self Care | Payer: Medicare Other | Source: Ambulatory Visit | Attending: Internal Medicine | Admitting: Internal Medicine

## 2020-10-12 DIAGNOSIS — M25511 Pain in right shoulder: Secondary | ICD-10-CM

## 2020-10-12 DIAGNOSIS — M25561 Pain in right knee: Secondary | ICD-10-CM | POA: Insufficient documentation

## 2020-10-13 ENCOUNTER — Encounter
Admission: RE | Admit: 2020-10-13 | Discharge: 2020-10-13 | Disposition: A | Discharge status: Home / Self Care | Payer: Medicare Other | Source: Ambulatory Visit | Attending: Internal Medicine | Admitting: Internal Medicine

## 2020-10-14 ENCOUNTER — Other Ambulatory Visit
Admission: RE | Admit: 2020-10-14 | Discharge: 2020-10-14 | Disposition: A | Discharge status: Home / Self Care | Payer: Medicare Other | Source: Ambulatory Visit | Attending: Internal Medicine | Admitting: Internal Medicine

## 2020-10-14 DIAGNOSIS — I4891 Unspecified atrial fibrillation: Secondary | ICD-10-CM | POA: Diagnosis present

## 2020-10-14 LAB — PROTIME-INR
INR: 2.6 — ABNORMAL HIGH (ref 0.8–1.2)
Prothrombin Time: 26.8 seconds — ABNORMAL HIGH (ref 11.4–15.2)

## 2020-10-28 ENCOUNTER — Telehealth (INDEPENDENT_AMBULATORY_CARE_PROVIDER_SITE_OTHER): Payer: Self-pay

## 2020-10-28 NOTE — Telephone Encounter (Signed)
I called the patient on his mobile phone attempting to ask him about scheduling a procedure and the patient answered and hung up.

## 2021-03-06 ENCOUNTER — Other Ambulatory Visit: Payer: Self-pay

## 2021-03-06 ENCOUNTER — Encounter (INDEPENDENT_AMBULATORY_CARE_PROVIDER_SITE_OTHER): Payer: Medicare Other | Admitting: Ophthalmology

## 2021-03-06 DIAGNOSIS — E113313 Type 2 diabetes mellitus with moderate nonproliferative diabetic retinopathy with macular edema, bilateral: Secondary | ICD-10-CM | POA: Diagnosis not present

## 2021-03-06 DIAGNOSIS — H43813 Vitreous degeneration, bilateral: Secondary | ICD-10-CM | POA: Diagnosis not present

## 2021-03-27 ENCOUNTER — Other Ambulatory Visit: Payer: Self-pay

## 2021-03-27 ENCOUNTER — Encounter (INDEPENDENT_AMBULATORY_CARE_PROVIDER_SITE_OTHER): Payer: Medicare Other | Admitting: Ophthalmology

## 2021-03-27 DIAGNOSIS — E113311 Type 2 diabetes mellitus with moderate nonproliferative diabetic retinopathy with macular edema, right eye: Secondary | ICD-10-CM | POA: Diagnosis not present

## 2021-04-03 ENCOUNTER — Encounter (INDEPENDENT_AMBULATORY_CARE_PROVIDER_SITE_OTHER): Payer: Medicare Other | Admitting: Ophthalmology

## 2021-04-03 ENCOUNTER — Other Ambulatory Visit: Payer: Self-pay

## 2021-04-03 DIAGNOSIS — H43813 Vitreous degeneration, bilateral: Secondary | ICD-10-CM

## 2021-04-03 DIAGNOSIS — E113312 Type 2 diabetes mellitus with moderate nonproliferative diabetic retinopathy with macular edema, left eye: Secondary | ICD-10-CM

## 2021-04-03 DIAGNOSIS — E113391 Type 2 diabetes mellitus with moderate nonproliferative diabetic retinopathy without macular edema, right eye: Secondary | ICD-10-CM | POA: Diagnosis not present

## 2021-05-01 ENCOUNTER — Other Ambulatory Visit: Payer: Self-pay

## 2021-05-01 ENCOUNTER — Encounter (INDEPENDENT_AMBULATORY_CARE_PROVIDER_SITE_OTHER): Payer: Medicare Other | Admitting: Ophthalmology

## 2021-05-01 DIAGNOSIS — E113312 Type 2 diabetes mellitus with moderate nonproliferative diabetic retinopathy with macular edema, left eye: Secondary | ICD-10-CM

## 2021-05-01 DIAGNOSIS — H43813 Vitreous degeneration, bilateral: Secondary | ICD-10-CM | POA: Diagnosis not present

## 2021-05-01 DIAGNOSIS — E113391 Type 2 diabetes mellitus with moderate nonproliferative diabetic retinopathy without macular edema, right eye: Secondary | ICD-10-CM | POA: Diagnosis not present

## 2021-05-29 ENCOUNTER — Other Ambulatory Visit: Payer: Self-pay

## 2021-05-29 ENCOUNTER — Encounter (INDEPENDENT_AMBULATORY_CARE_PROVIDER_SITE_OTHER): Payer: Medicare Other | Admitting: Ophthalmology

## 2021-05-29 DIAGNOSIS — E113312 Type 2 diabetes mellitus with moderate nonproliferative diabetic retinopathy with macular edema, left eye: Secondary | ICD-10-CM | POA: Diagnosis not present

## 2021-05-29 DIAGNOSIS — E113391 Type 2 diabetes mellitus with moderate nonproliferative diabetic retinopathy without macular edema, right eye: Secondary | ICD-10-CM | POA: Diagnosis not present

## 2021-05-29 DIAGNOSIS — H43813 Vitreous degeneration, bilateral: Secondary | ICD-10-CM | POA: Diagnosis not present

## 2021-07-24 ENCOUNTER — Other Ambulatory Visit: Payer: Self-pay

## 2021-07-24 ENCOUNTER — Encounter (INDEPENDENT_AMBULATORY_CARE_PROVIDER_SITE_OTHER): Payer: Medicare Other | Admitting: Ophthalmology

## 2021-07-24 DIAGNOSIS — E113312 Type 2 diabetes mellitus with moderate nonproliferative diabetic retinopathy with macular edema, left eye: Secondary | ICD-10-CM

## 2021-07-24 DIAGNOSIS — E113391 Type 2 diabetes mellitus with moderate nonproliferative diabetic retinopathy without macular edema, right eye: Secondary | ICD-10-CM | POA: Diagnosis not present

## 2021-07-24 DIAGNOSIS — H43813 Vitreous degeneration, bilateral: Secondary | ICD-10-CM | POA: Diagnosis not present

## 2021-08-21 ENCOUNTER — Other Ambulatory Visit: Payer: Self-pay

## 2021-08-21 ENCOUNTER — Encounter (INDEPENDENT_AMBULATORY_CARE_PROVIDER_SITE_OTHER): Payer: Medicare Other | Admitting: Ophthalmology

## 2021-08-21 DIAGNOSIS — H43813 Vitreous degeneration, bilateral: Secondary | ICD-10-CM

## 2021-08-21 DIAGNOSIS — E113312 Type 2 diabetes mellitus with moderate nonproliferative diabetic retinopathy with macular edema, left eye: Secondary | ICD-10-CM

## 2021-08-21 DIAGNOSIS — E113391 Type 2 diabetes mellitus with moderate nonproliferative diabetic retinopathy without macular edema, right eye: Secondary | ICD-10-CM

## 2021-09-25 ENCOUNTER — Encounter (INDEPENDENT_AMBULATORY_CARE_PROVIDER_SITE_OTHER): Payer: Medicare Other | Admitting: Ophthalmology

## 2021-09-28 IMAGING — MR MR KNEE*R* W/O CM
6 series · 40 of 40 positions shown · non-contrast
Comparison: X-ray 05/05/2019

CLINICAL DATA: Right knee pain after fall on 10/04/2020

EXAM:
MRI OF THE RIGHT KNEE WITHOUT CONTRAST
TECHNIQUE: Multiplanar, multisequence MR imaging of the knee was performed. No
intravenous contrast was administered. Metal artifact reduction
protocol was utilized.

[Series 5: ax stir_high-bw · axial · right · 4.0mm · 0.62mm/px · z∈[-93,+52]mm · 7 of 30 slices shown]
[im 1/30]
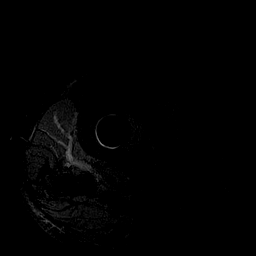
[im 5/30]
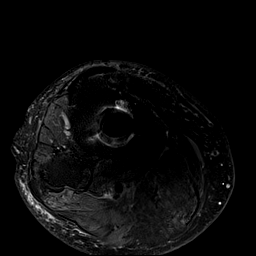
[im 10/30]
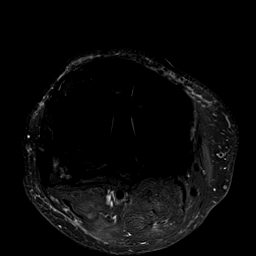
[im 15/30]
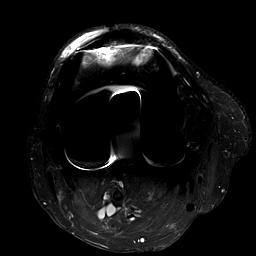
[im 20/30]
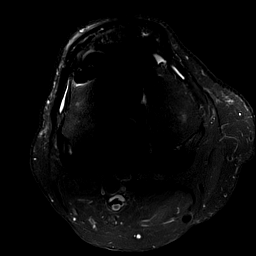
[im 25/30]
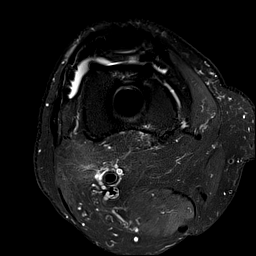
[im 30/30]
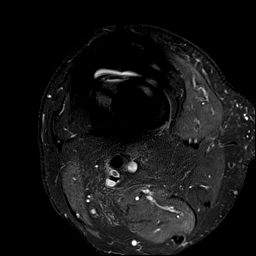

[Series 6: cor t1_high-bw · coronal · right · 4.0mm · 0.50mm/px · 6 of 30 slices shown]
[im 1/30]
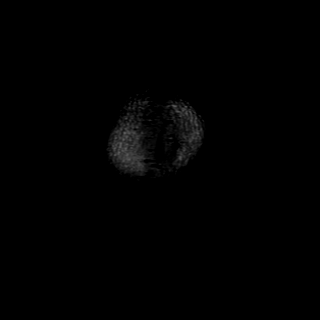
[im 6/30]
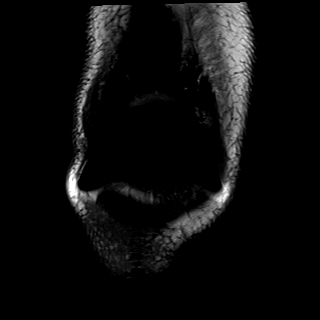
[im 12/30]
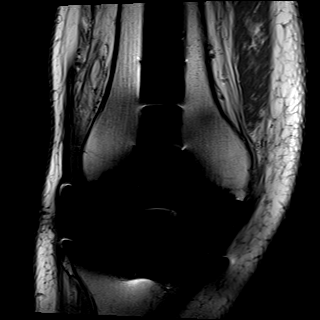
[im 18/30]
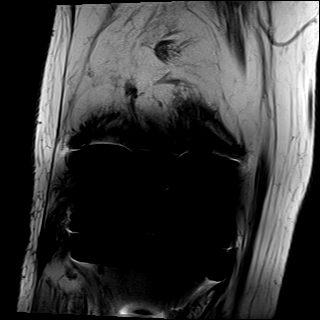
[im 24/30]
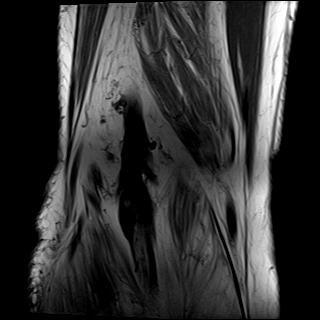
[im 30/30]
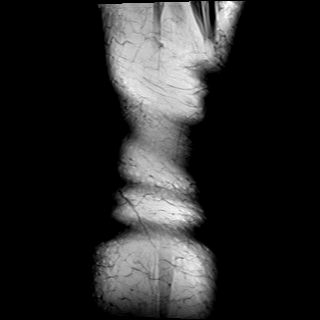

[Series 7: cor stir_high-bw · coronal · right · 4.0mm · 0.62mm/px · 6 of 30 slices shown]
[im 1/30]
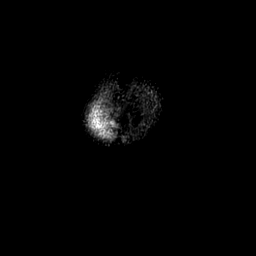
[im 6/30]
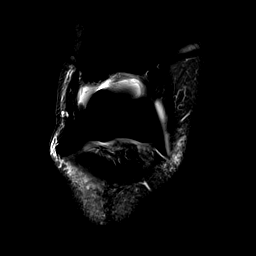
[im 12/30]
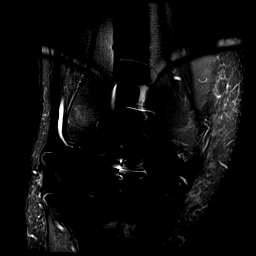
[im 18/30]
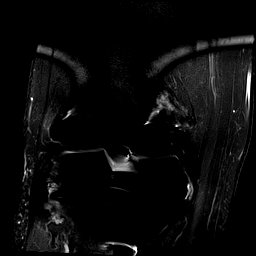
[im 24/30]
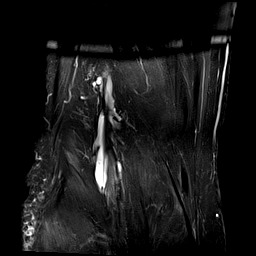
[im 30/30]
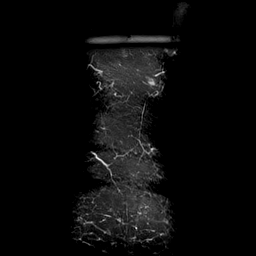

[Series 8: PD fat-sat · coronal · right · 4.0mm · 0.59mm/px · 7 of 32 slices shown]
[im 1/32]
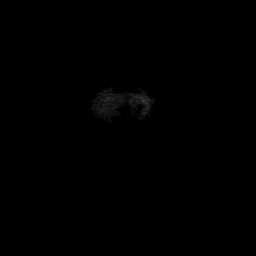
[im 6/32]
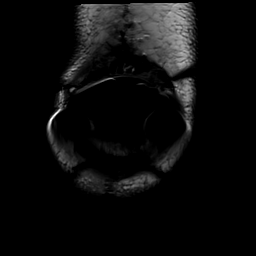
[im 11/32]
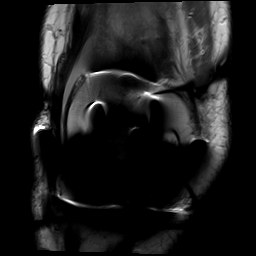
[im 16/32]
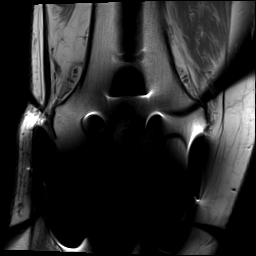
[im 21/32]
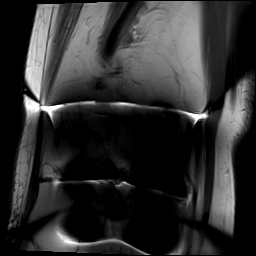
[im 26/32]
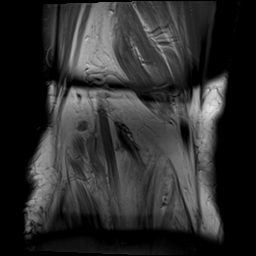
[im 32/32]
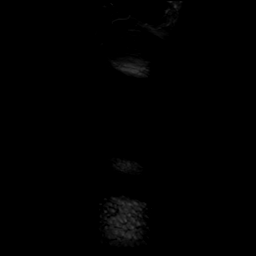

[Series 9: t2_tse_stir_sag_cs8_semac · sagittal · right · 3.0mm · 0.78mm/px · 7 of 36 slices shown]
[im 1/36]
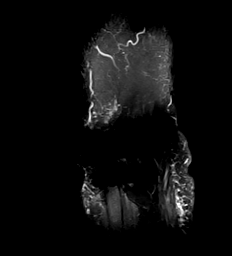
[im 6/36]
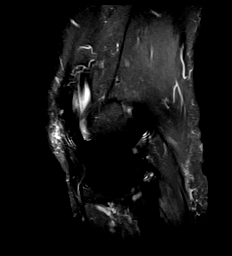
[im 12/36]
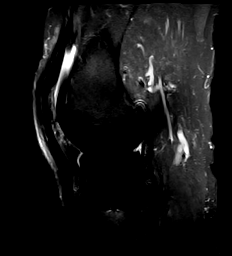
[im 18/36]
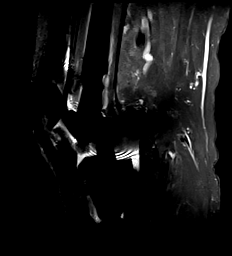
[im 24/36]
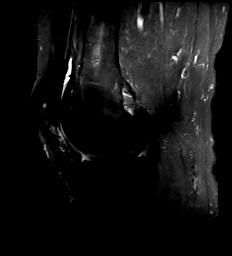
[im 30/36]
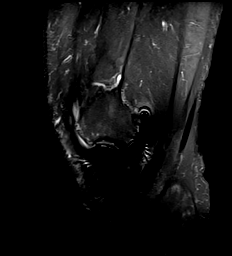
[im 36/36]
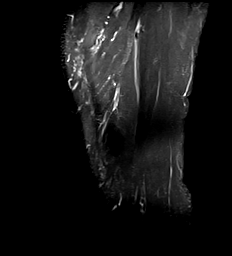

[Series 10: pd_tse_sag_cs8_semac · sagittal · right · 3.0mm · 0.50mm/px · 7 of 36 slices shown]
[im 1/36]
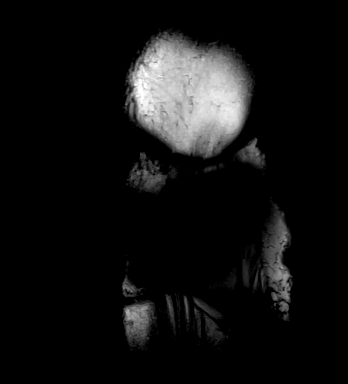
[im 6/36]
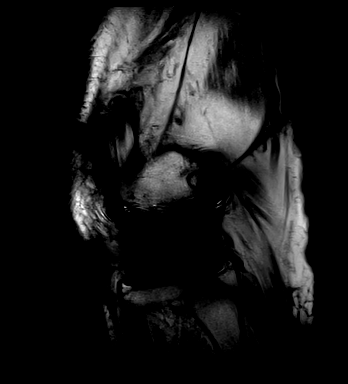
[im 12/36]
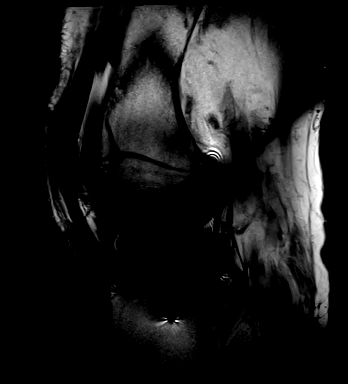
[im 18/36]
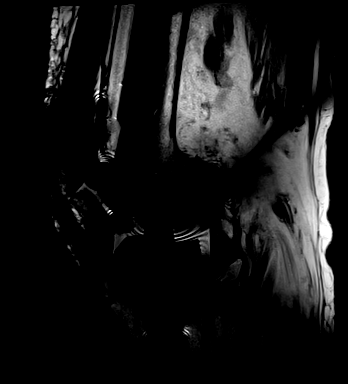
[im 24/36]
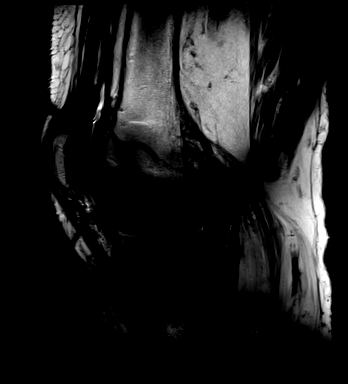
[im 30/36]
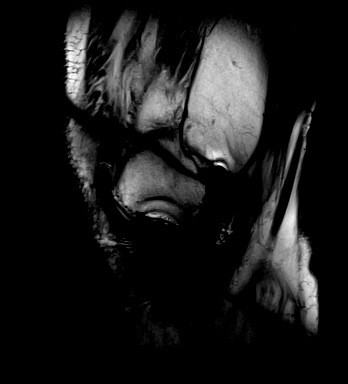
[im 36/36]
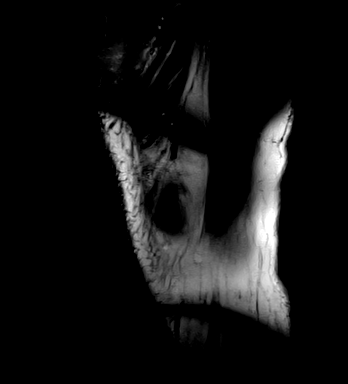

[40 of 40 positions shown; findings below may reference images not displayed]

FINDINGS: Bones/Joint/Cartilage

Patient is status post right total knee arthroplasty with long stem
tibial and femoral components. Evaluation of the adjacent structures
is limited secondary to the degree of metallic susceptibility
artifact. Metal artifact reduction protocol was utilized. Within the
limitations, there is no evidence of periprosthetic loosening or
fracture. No bone marrow edema is seen. There is a small
suprapatellar knee joint effusion. No hematocrit or fat-fluid level
is evident. No Baker's cyst.

Ligaments

Collateral ligaments appear grossly intact.

Muscles and Tendons

No acute tendinous abnormality identified. Muscle atrophy and fatty
infiltration most predominantly affecting the posterior calf
musculature.

Soft tissues

Mild prepatellar subcutaneous edema. No fluid collections within the
soft tissues.
IMPRESSION: 1. No evidence of acute osseous abnormality or hardware complication
status post right total knee arthroplasty.
2. Small knee joint effusion.
3. Muscle atrophy and fatty infiltration most predominantly
affecting the posterior calf musculature.

## 2021-10-02 ENCOUNTER — Other Ambulatory Visit: Payer: Self-pay

## 2021-10-02 ENCOUNTER — Encounter (INDEPENDENT_AMBULATORY_CARE_PROVIDER_SITE_OTHER): Payer: Medicare Other | Admitting: Ophthalmology

## 2021-10-02 DIAGNOSIS — H43813 Vitreous degeneration, bilateral: Secondary | ICD-10-CM

## 2021-10-02 DIAGNOSIS — E113312 Type 2 diabetes mellitus with moderate nonproliferative diabetic retinopathy with macular edema, left eye: Secondary | ICD-10-CM | POA: Diagnosis not present

## 2021-10-02 DIAGNOSIS — E113391 Type 2 diabetes mellitus with moderate nonproliferative diabetic retinopathy without macular edema, right eye: Secondary | ICD-10-CM

## 2021-11-06 ENCOUNTER — Encounter (INDEPENDENT_AMBULATORY_CARE_PROVIDER_SITE_OTHER): Payer: Medicare Other | Admitting: Ophthalmology

## 2021-11-06 ENCOUNTER — Other Ambulatory Visit: Payer: Self-pay

## 2021-11-06 DIAGNOSIS — H43813 Vitreous degeneration, bilateral: Secondary | ICD-10-CM

## 2021-11-06 DIAGNOSIS — E113312 Type 2 diabetes mellitus with moderate nonproliferative diabetic retinopathy with macular edema, left eye: Secondary | ICD-10-CM

## 2021-11-06 DIAGNOSIS — E113391 Type 2 diabetes mellitus with moderate nonproliferative diabetic retinopathy without macular edema, right eye: Secondary | ICD-10-CM

## 2021-12-11 ENCOUNTER — Encounter (INDEPENDENT_AMBULATORY_CARE_PROVIDER_SITE_OTHER): Payer: Medicare Other | Admitting: Ophthalmology

## 2021-12-12 ENCOUNTER — Encounter (INDEPENDENT_AMBULATORY_CARE_PROVIDER_SITE_OTHER): Payer: Medicare Other | Admitting: Ophthalmology

## 2021-12-13 ENCOUNTER — Encounter (INDEPENDENT_AMBULATORY_CARE_PROVIDER_SITE_OTHER): Payer: Medicare Other | Admitting: Ophthalmology

## 2021-12-13 DIAGNOSIS — E113391 Type 2 diabetes mellitus with moderate nonproliferative diabetic retinopathy without macular edema, right eye: Secondary | ICD-10-CM | POA: Diagnosis not present

## 2021-12-13 DIAGNOSIS — H43813 Vitreous degeneration, bilateral: Secondary | ICD-10-CM

## 2021-12-13 DIAGNOSIS — E113312 Type 2 diabetes mellitus with moderate nonproliferative diabetic retinopathy with macular edema, left eye: Secondary | ICD-10-CM

## 2022-01-15 ENCOUNTER — Encounter (INDEPENDENT_AMBULATORY_CARE_PROVIDER_SITE_OTHER): Payer: Medicare Other | Admitting: Ophthalmology

## 2022-01-22 ENCOUNTER — Encounter (INDEPENDENT_AMBULATORY_CARE_PROVIDER_SITE_OTHER): Payer: Medicare Other | Admitting: Ophthalmology

## 2022-01-29 ENCOUNTER — Encounter (INDEPENDENT_AMBULATORY_CARE_PROVIDER_SITE_OTHER): Payer: Medicare Other | Admitting: Ophthalmology

## 2022-01-29 DIAGNOSIS — H43813 Vitreous degeneration, bilateral: Secondary | ICD-10-CM | POA: Diagnosis not present

## 2022-01-29 DIAGNOSIS — E113312 Type 2 diabetes mellitus with moderate nonproliferative diabetic retinopathy with macular edema, left eye: Secondary | ICD-10-CM | POA: Diagnosis not present

## 2022-01-29 DIAGNOSIS — E113391 Type 2 diabetes mellitus with moderate nonproliferative diabetic retinopathy without macular edema, right eye: Secondary | ICD-10-CM

## 2022-02-26 ENCOUNTER — Encounter (INDEPENDENT_AMBULATORY_CARE_PROVIDER_SITE_OTHER): Payer: Medicare Other | Admitting: Ophthalmology

## 2022-02-26 DIAGNOSIS — E113312 Type 2 diabetes mellitus with moderate nonproliferative diabetic retinopathy with macular edema, left eye: Secondary | ICD-10-CM | POA: Diagnosis not present

## 2022-02-26 DIAGNOSIS — H43813 Vitreous degeneration, bilateral: Secondary | ICD-10-CM | POA: Diagnosis not present

## 2022-02-26 DIAGNOSIS — E113391 Type 2 diabetes mellitus with moderate nonproliferative diabetic retinopathy without macular edema, right eye: Secondary | ICD-10-CM

## 2022-04-23 ENCOUNTER — Encounter (INDEPENDENT_AMBULATORY_CARE_PROVIDER_SITE_OTHER): Payer: Medicare Other | Admitting: Ophthalmology

## 2022-04-23 DIAGNOSIS — H43813 Vitreous degeneration, bilateral: Secondary | ICD-10-CM

## 2022-04-23 DIAGNOSIS — E113312 Type 2 diabetes mellitus with moderate nonproliferative diabetic retinopathy with macular edema, left eye: Secondary | ICD-10-CM | POA: Diagnosis not present

## 2022-04-23 DIAGNOSIS — E113391 Type 2 diabetes mellitus with moderate nonproliferative diabetic retinopathy without macular edema, right eye: Secondary | ICD-10-CM

## 2022-05-23 ENCOUNTER — Encounter (INDEPENDENT_AMBULATORY_CARE_PROVIDER_SITE_OTHER): Payer: Medicare Other | Admitting: Ophthalmology

## 2022-05-23 DIAGNOSIS — H43813 Vitreous degeneration, bilateral: Secondary | ICD-10-CM | POA: Diagnosis not present

## 2022-05-23 DIAGNOSIS — E113313 Type 2 diabetes mellitus with moderate nonproliferative diabetic retinopathy with macular edema, bilateral: Secondary | ICD-10-CM | POA: Diagnosis not present

## 2022-07-02 ENCOUNTER — Encounter (INDEPENDENT_AMBULATORY_CARE_PROVIDER_SITE_OTHER): Payer: Medicare Other | Admitting: Ophthalmology

## 2022-07-02 DIAGNOSIS — E113312 Type 2 diabetes mellitus with moderate nonproliferative diabetic retinopathy with macular edema, left eye: Secondary | ICD-10-CM | POA: Diagnosis not present

## 2022-07-02 DIAGNOSIS — H43813 Vitreous degeneration, bilateral: Secondary | ICD-10-CM

## 2022-07-02 DIAGNOSIS — E113391 Type 2 diabetes mellitus with moderate nonproliferative diabetic retinopathy without macular edema, right eye: Secondary | ICD-10-CM

## 2022-07-16 ENCOUNTER — Encounter (INDEPENDENT_AMBULATORY_CARE_PROVIDER_SITE_OTHER): Payer: Self-pay

## 2022-08-03 ENCOUNTER — Encounter (INDEPENDENT_AMBULATORY_CARE_PROVIDER_SITE_OTHER): Payer: Medicare Other | Admitting: Ophthalmology

## 2022-08-03 DIAGNOSIS — E113312 Type 2 diabetes mellitus with moderate nonproliferative diabetic retinopathy with macular edema, left eye: Secondary | ICD-10-CM

## 2022-08-03 DIAGNOSIS — E113391 Type 2 diabetes mellitus with moderate nonproliferative diabetic retinopathy without macular edema, right eye: Secondary | ICD-10-CM

## 2022-08-03 DIAGNOSIS — H43813 Vitreous degeneration, bilateral: Secondary | ICD-10-CM

## 2022-09-07 ENCOUNTER — Encounter (INDEPENDENT_AMBULATORY_CARE_PROVIDER_SITE_OTHER): Payer: Medicare Other | Admitting: Ophthalmology

## 2022-09-07 DIAGNOSIS — E113391 Type 2 diabetes mellitus with moderate nonproliferative diabetic retinopathy without macular edema, right eye: Secondary | ICD-10-CM | POA: Diagnosis not present

## 2022-09-07 DIAGNOSIS — H35033 Hypertensive retinopathy, bilateral: Secondary | ICD-10-CM | POA: Diagnosis not present

## 2022-09-07 DIAGNOSIS — H43813 Vitreous degeneration, bilateral: Secondary | ICD-10-CM

## 2022-09-07 DIAGNOSIS — I10 Essential (primary) hypertension: Secondary | ICD-10-CM

## 2022-09-07 DIAGNOSIS — E113312 Type 2 diabetes mellitus with moderate nonproliferative diabetic retinopathy with macular edema, left eye: Secondary | ICD-10-CM

## 2022-10-12 ENCOUNTER — Encounter (INDEPENDENT_AMBULATORY_CARE_PROVIDER_SITE_OTHER): Payer: Medicare Other | Admitting: Ophthalmology

## 2022-10-12 DIAGNOSIS — H43813 Vitreous degeneration, bilateral: Secondary | ICD-10-CM | POA: Diagnosis not present

## 2022-10-12 DIAGNOSIS — E113312 Type 2 diabetes mellitus with moderate nonproliferative diabetic retinopathy with macular edema, left eye: Secondary | ICD-10-CM | POA: Diagnosis not present

## 2022-10-12 DIAGNOSIS — E113391 Type 2 diabetes mellitus with moderate nonproliferative diabetic retinopathy without macular edema, right eye: Secondary | ICD-10-CM

## 2022-11-16 ENCOUNTER — Encounter (INDEPENDENT_AMBULATORY_CARE_PROVIDER_SITE_OTHER): Payer: Medicare Other | Admitting: Ophthalmology

## 2022-11-16 DIAGNOSIS — E113391 Type 2 diabetes mellitus with moderate nonproliferative diabetic retinopathy without macular edema, right eye: Secondary | ICD-10-CM

## 2022-11-16 DIAGNOSIS — H43813 Vitreous degeneration, bilateral: Secondary | ICD-10-CM | POA: Diagnosis not present

## 2022-11-16 DIAGNOSIS — E113312 Type 2 diabetes mellitus with moderate nonproliferative diabetic retinopathy with macular edema, left eye: Secondary | ICD-10-CM

## 2022-12-17 ENCOUNTER — Encounter (INDEPENDENT_AMBULATORY_CARE_PROVIDER_SITE_OTHER): Payer: Medicare Other | Admitting: Ophthalmology

## 2022-12-17 DIAGNOSIS — E113391 Type 2 diabetes mellitus with moderate nonproliferative diabetic retinopathy without macular edema, right eye: Secondary | ICD-10-CM

## 2022-12-17 DIAGNOSIS — E113312 Type 2 diabetes mellitus with moderate nonproliferative diabetic retinopathy with macular edema, left eye: Secondary | ICD-10-CM | POA: Diagnosis not present

## 2022-12-17 DIAGNOSIS — H43813 Vitreous degeneration, bilateral: Secondary | ICD-10-CM

## 2023-01-21 ENCOUNTER — Encounter (INDEPENDENT_AMBULATORY_CARE_PROVIDER_SITE_OTHER): Payer: Medicare Other | Admitting: Ophthalmology

## 2023-01-21 DIAGNOSIS — E113312 Type 2 diabetes mellitus with moderate nonproliferative diabetic retinopathy with macular edema, left eye: Secondary | ICD-10-CM | POA: Diagnosis not present

## 2023-01-21 DIAGNOSIS — H43813 Vitreous degeneration, bilateral: Secondary | ICD-10-CM | POA: Diagnosis not present

## 2023-01-21 DIAGNOSIS — E113391 Type 2 diabetes mellitus with moderate nonproliferative diabetic retinopathy without macular edema, right eye: Secondary | ICD-10-CM

## 2023-01-21 DIAGNOSIS — Z794 Long term (current) use of insulin: Secondary | ICD-10-CM

## 2023-02-18 ENCOUNTER — Encounter (INDEPENDENT_AMBULATORY_CARE_PROVIDER_SITE_OTHER): Payer: Medicare Other | Admitting: Ophthalmology

## 2023-02-18 DIAGNOSIS — Z7984 Long term (current) use of oral hypoglycemic drugs: Secondary | ICD-10-CM

## 2023-02-18 DIAGNOSIS — E113391 Type 2 diabetes mellitus with moderate nonproliferative diabetic retinopathy without macular edema, right eye: Secondary | ICD-10-CM

## 2023-02-18 DIAGNOSIS — E113312 Type 2 diabetes mellitus with moderate nonproliferative diabetic retinopathy with macular edema, left eye: Secondary | ICD-10-CM | POA: Diagnosis not present

## 2023-02-18 DIAGNOSIS — H43813 Vitreous degeneration, bilateral: Secondary | ICD-10-CM | POA: Diagnosis not present

## 2023-02-18 DIAGNOSIS — Z794 Long term (current) use of insulin: Secondary | ICD-10-CM

## 2023-04-22 ENCOUNTER — Encounter (INDEPENDENT_AMBULATORY_CARE_PROVIDER_SITE_OTHER): Payer: Medicare Other | Admitting: Ophthalmology

## 2023-05-01 ENCOUNTER — Encounter (INDEPENDENT_AMBULATORY_CARE_PROVIDER_SITE_OTHER): Payer: Medicare Other | Admitting: Ophthalmology

## 2023-05-01 DIAGNOSIS — H43813 Vitreous degeneration, bilateral: Secondary | ICD-10-CM | POA: Diagnosis not present

## 2023-05-01 DIAGNOSIS — E113391 Type 2 diabetes mellitus with moderate nonproliferative diabetic retinopathy without macular edema, right eye: Secondary | ICD-10-CM

## 2023-05-01 DIAGNOSIS — E113312 Type 2 diabetes mellitus with moderate nonproliferative diabetic retinopathy with macular edema, left eye: Secondary | ICD-10-CM | POA: Diagnosis not present

## 2023-05-01 DIAGNOSIS — Z7984 Long term (current) use of oral hypoglycemic drugs: Secondary | ICD-10-CM | POA: Diagnosis not present

## 2023-06-04 ENCOUNTER — Other Ambulatory Visit: Payer: Self-pay

## 2023-06-04 ENCOUNTER — Emergency Department: Payer: Medicare Other

## 2023-06-04 ENCOUNTER — Inpatient Hospital Stay: Payer: Medicare Other

## 2023-06-04 ENCOUNTER — Inpatient Hospital Stay
Admission: EM | Admit: 2023-06-04 | Discharge: 2023-06-12 | DRG: 629 | Disposition: A | Discharge status: Skilled Nursing Facility | Payer: Medicare Other | Source: Ambulatory Visit | Attending: Internal Medicine | Admitting: Internal Medicine

## 2023-06-04 DIAGNOSIS — E1165 Type 2 diabetes mellitus with hyperglycemia: Secondary | ICD-10-CM | POA: Diagnosis not present

## 2023-06-04 DIAGNOSIS — I252 Old myocardial infarction: Secondary | ICD-10-CM

## 2023-06-04 DIAGNOSIS — I152 Hypertension secondary to endocrine disorders: Secondary | ICD-10-CM | POA: Diagnosis present

## 2023-06-04 DIAGNOSIS — D638 Anemia in other chronic diseases classified elsewhere: Secondary | ICD-10-CM | POA: Diagnosis present

## 2023-06-04 DIAGNOSIS — I480 Paroxysmal atrial fibrillation: Secondary | ICD-10-CM | POA: Diagnosis not present

## 2023-06-04 DIAGNOSIS — N179 Acute kidney failure, unspecified: Secondary | ICD-10-CM | POA: Diagnosis present

## 2023-06-04 DIAGNOSIS — Z794 Long term (current) use of insulin: Secondary | ICD-10-CM

## 2023-06-04 DIAGNOSIS — L97424 Non-pressure chronic ulcer of left heel and midfoot with necrosis of bone: Secondary | ICD-10-CM | POA: Diagnosis not present

## 2023-06-04 DIAGNOSIS — B961 Klebsiella pneumoniae [K. pneumoniae] as the cause of diseases classified elsewhere: Secondary | ICD-10-CM | POA: Diagnosis present

## 2023-06-04 DIAGNOSIS — Z751 Person awaiting admission to adequate facility elsewhere: Secondary | ICD-10-CM

## 2023-06-04 DIAGNOSIS — I825Y1 Chronic embolism and thrombosis of unspecified deep veins of right proximal lower extremity: Secondary | ICD-10-CM | POA: Diagnosis not present

## 2023-06-04 DIAGNOSIS — E109 Type 1 diabetes mellitus without complications: Secondary | ICD-10-CM | POA: Diagnosis present

## 2023-06-04 DIAGNOSIS — N1831 Chronic kidney disease, stage 3a: Secondary | ICD-10-CM | POA: Diagnosis present

## 2023-06-04 DIAGNOSIS — E11621 Type 2 diabetes mellitus with foot ulcer: Secondary | ICD-10-CM | POA: Diagnosis present

## 2023-06-04 DIAGNOSIS — M86672 Other chronic osteomyelitis, left ankle and foot: Secondary | ICD-10-CM | POA: Diagnosis present

## 2023-06-04 DIAGNOSIS — B958 Unspecified staphylococcus as the cause of diseases classified elsewhere: Secondary | ICD-10-CM | POA: Diagnosis present

## 2023-06-04 DIAGNOSIS — G4733 Obstructive sleep apnea (adult) (pediatric): Secondary | ICD-10-CM | POA: Diagnosis present

## 2023-06-04 DIAGNOSIS — I739 Peripheral vascular disease, unspecified: Secondary | ICD-10-CM | POA: Diagnosis not present

## 2023-06-04 DIAGNOSIS — I4891 Unspecified atrial fibrillation: Secondary | ICD-10-CM | POA: Diagnosis not present

## 2023-06-04 DIAGNOSIS — L97429 Non-pressure chronic ulcer of left heel and midfoot with unspecified severity: Secondary | ICD-10-CM | POA: Insufficient documentation

## 2023-06-04 DIAGNOSIS — L03116 Cellulitis of left lower limb: Secondary | ICD-10-CM | POA: Diagnosis present

## 2023-06-04 DIAGNOSIS — Z955 Presence of coronary angioplasty implant and graft: Secondary | ICD-10-CM

## 2023-06-04 DIAGNOSIS — E1059 Type 1 diabetes mellitus with other circulatory complications: Secondary | ICD-10-CM | POA: Diagnosis present

## 2023-06-04 DIAGNOSIS — L089 Local infection of the skin and subcutaneous tissue, unspecified: Principal | ICD-10-CM

## 2023-06-04 DIAGNOSIS — L97428 Non-pressure chronic ulcer of left heel and midfoot with other specified severity: Secondary | ICD-10-CM | POA: Diagnosis present

## 2023-06-04 DIAGNOSIS — I82501 Chronic embolism and thrombosis of unspecified deep veins of right lower extremity: Secondary | ICD-10-CM | POA: Diagnosis present

## 2023-06-04 DIAGNOSIS — E1051 Type 1 diabetes mellitus with diabetic peripheral angiopathy without gangrene: Secondary | ICD-10-CM | POA: Diagnosis present

## 2023-06-04 DIAGNOSIS — B965 Pseudomonas (aeruginosa) (mallei) (pseudomallei) as the cause of diseases classified elsewhere: Secondary | ICD-10-CM | POA: Diagnosis present

## 2023-06-04 DIAGNOSIS — E10628 Type 1 diabetes mellitus with other skin complications: Secondary | ICD-10-CM | POA: Diagnosis present

## 2023-06-04 DIAGNOSIS — B955 Unspecified streptococcus as the cause of diseases classified elsewhere: Secondary | ICD-10-CM | POA: Diagnosis present

## 2023-06-04 DIAGNOSIS — Z9989 Dependence on other enabling machines and devices: Secondary | ICD-10-CM

## 2023-06-04 DIAGNOSIS — E1022 Type 1 diabetes mellitus with diabetic chronic kidney disease: Secondary | ICD-10-CM | POA: Diagnosis present

## 2023-06-04 DIAGNOSIS — I10 Essential (primary) hypertension: Secondary | ICD-10-CM | POA: Diagnosis present

## 2023-06-04 DIAGNOSIS — E871 Hypo-osmolality and hyponatremia: Secondary | ICD-10-CM | POA: Diagnosis present

## 2023-06-04 DIAGNOSIS — Z7901 Long term (current) use of anticoagulants: Secondary | ICD-10-CM

## 2023-06-04 DIAGNOSIS — L97509 Non-pressure chronic ulcer of other part of unspecified foot with unspecified severity: Secondary | ICD-10-CM | POA: Diagnosis not present

## 2023-06-04 DIAGNOSIS — N183 Chronic kidney disease, stage 3 unspecified: Secondary | ICD-10-CM | POA: Diagnosis not present

## 2023-06-04 DIAGNOSIS — Z96653 Presence of artificial knee joint, bilateral: Secondary | ICD-10-CM | POA: Diagnosis present

## 2023-06-04 DIAGNOSIS — K219 Gastro-esophageal reflux disease without esophagitis: Secondary | ICD-10-CM | POA: Diagnosis present

## 2023-06-04 DIAGNOSIS — E1069 Type 1 diabetes mellitus with other specified complication: Secondary | ICD-10-CM | POA: Diagnosis present

## 2023-06-04 DIAGNOSIS — Z7984 Long term (current) use of oral hypoglycemic drugs: Secondary | ICD-10-CM

## 2023-06-04 DIAGNOSIS — Z79899 Other long term (current) drug therapy: Secondary | ICD-10-CM

## 2023-06-04 DIAGNOSIS — Z981 Arthrodesis status: Secondary | ICD-10-CM

## 2023-06-04 DIAGNOSIS — E1042 Type 1 diabetes mellitus with diabetic polyneuropathy: Secondary | ICD-10-CM | POA: Diagnosis present

## 2023-06-04 DIAGNOSIS — I70244 Atherosclerosis of native arteries of left leg with ulceration of heel and midfoot: Secondary | ICD-10-CM | POA: Diagnosis not present

## 2023-06-04 DIAGNOSIS — L97422 Non-pressure chronic ulcer of left heel and midfoot with fat layer exposed: Secondary | ICD-10-CM | POA: Diagnosis not present

## 2023-06-04 DIAGNOSIS — Z9641 Presence of insulin pump (external) (internal): Secondary | ICD-10-CM | POA: Diagnosis present

## 2023-06-04 DIAGNOSIS — E10621 Type 1 diabetes mellitus with foot ulcer: Secondary | ICD-10-CM | POA: Diagnosis present

## 2023-06-04 DIAGNOSIS — I2510 Atherosclerotic heart disease of native coronary artery without angina pectoris: Secondary | ICD-10-CM | POA: Diagnosis present

## 2023-06-04 DIAGNOSIS — Z89422 Acquired absence of other left toe(s): Secondary | ICD-10-CM

## 2023-06-04 DIAGNOSIS — I70201 Unspecified atherosclerosis of native arteries of extremities, right leg: Secondary | ICD-10-CM | POA: Diagnosis not present

## 2023-06-04 DIAGNOSIS — Z85820 Personal history of malignant melanoma of skin: Secondary | ICD-10-CM

## 2023-06-04 LAB — PROTIME-INR
INR: 1.2 (ref 0.8–1.2)
Prothrombin Time: 15.1 s (ref 11.4–15.2)

## 2023-06-04 LAB — CBC WITH DIFFERENTIAL/PLATELET
Abs Immature Granulocytes: 0.03 10*3/uL (ref 0.00–0.07)
Basophils Absolute: 0 10*3/uL (ref 0.0–0.1)
Basophils Relative: 0 %
Eosinophils Absolute: 0.1 10*3/uL (ref 0.0–0.5)
Eosinophils Relative: 1 %
HCT: 38.7 % — ABNORMAL LOW (ref 39.0–52.0)
Hemoglobin: 12.4 g/dL — ABNORMAL LOW (ref 13.0–17.0)
Immature Granulocytes: 0 %
Lymphocytes Relative: 12 %
Lymphs Abs: 1.2 10*3/uL (ref 0.7–4.0)
MCH: 30.5 pg (ref 26.0–34.0)
MCHC: 32 g/dL (ref 30.0–36.0)
MCV: 95.3 fL (ref 80.0–100.0)
Monocytes Absolute: 0.7 10*3/uL (ref 0.1–1.0)
Monocytes Relative: 6 %
Neutro Abs: 8.2 10*3/uL — ABNORMAL HIGH (ref 1.7–7.7)
Neutrophils Relative %: 81 %
Platelets: UNDETERMINED 10*3/uL (ref 150–400)
RBC: 4.06 MIL/uL — ABNORMAL LOW (ref 4.22–5.81)
RDW: 14 % (ref 11.5–15.5)
WBC: 10.2 10*3/uL (ref 4.0–10.5)
nRBC: 0 % (ref 0.0–0.2)

## 2023-06-04 LAB — COMPREHENSIVE METABOLIC PANEL
ALT: 21 U/L (ref 0–44)
AST: 33 U/L (ref 15–41)
Albumin: 3.2 g/dL — ABNORMAL LOW (ref 3.5–5.0)
Alkaline Phosphatase: 98 U/L (ref 38–126)
Anion gap: 13 (ref 5–15)
BUN: 24 mg/dL — ABNORMAL HIGH (ref 8–23)
CO2: 20 mmol/L — ABNORMAL LOW (ref 22–32)
Calcium: 8.4 mg/dL — ABNORMAL LOW (ref 8.9–10.3)
Chloride: 99 mmol/L (ref 98–111)
Creatinine, Ser: 1.66 mg/dL — ABNORMAL HIGH (ref 0.61–1.24)
GFR, Estimated: 41 mL/min — ABNORMAL LOW (ref >60)
Glucose, Bld: 352 mg/dL — ABNORMAL HIGH (ref 70–99)
Potassium: 5.1 mmol/L (ref 3.5–5.1)
Sodium: 132 mmol/L — ABNORMAL LOW (ref 135–145)
Total Bilirubin: 1 mg/dL (ref 0.3–1.2)
Total Protein: 6.9 g/dL (ref 6.5–8.1)

## 2023-06-04 LAB — CBG MONITORING, ED
Glucose-Capillary: 317 mg/dL — ABNORMAL HIGH (ref 70–99)
Glucose-Capillary: 277 mg/dL — ABNORMAL HIGH (ref 70–99)
Glucose-Capillary: 234 mg/dL — ABNORMAL HIGH (ref 70–99)

## 2023-06-04 LAB — LACTIC ACID, PLASMA
Lactic Acid, Venous: 2.4 mmol/L (ref 0.5–1.9)
Lactic Acid, Venous: 1.6 mmol/L (ref 0.5–1.9)

## 2023-06-04 MED ORDER — VANCOMYCIN HCL IN DEXTROSE 1-5 GM/200ML-% IV SOLN
1000.0000 mg | Freq: Once | INTRAVENOUS | Status: AC
  Administered 2023-06-04: 1000 mg via INTRAVENOUS
  Filled 2023-06-04: qty 200

## 2023-06-04 MED ORDER — SODIUM CHLORIDE 0.9% FLUSH
3.0000 mL | Freq: Two times a day (BID) | INTRAVENOUS | Status: DC
  Administered 2023-06-04 – 2023-06-12 (×10): 3 mL via INTRAVENOUS

## 2023-06-04 MED ORDER — ACETAMINOPHEN 325 MG PO TABS: 650.0000 mg | ORAL_TABLET | Freq: Four times a day (QID) | ORAL | Status: DC | PRN

## 2023-06-04 MED ORDER — PANTOPRAZOLE SODIUM 40 MG IV SOLR
40.0000 mg | Freq: Two times a day (BID) | INTRAVENOUS | Status: DC
  Administered 2023-06-04 – 2023-06-12 (×15): 40 mg via INTRAVENOUS
  Filled 2023-06-04 (×15): qty 10

## 2023-06-04 MED ORDER — ROSUVASTATIN CALCIUM 10 MG PO TABS
40.0000 mg | ORAL_TABLET | Freq: Every day | ORAL | Status: DC
  Administered 2023-06-05 – 2023-06-12 (×7): 40 mg via ORAL
  Filled 2023-06-04 (×7): qty 4

## 2023-06-04 MED ORDER — ATORVASTATIN CALCIUM 20 MG PO TABS
80.0000 mg | ORAL_TABLET | Freq: Every evening | ORAL | Status: DC
  Administered 2023-06-04: 80 mg via ORAL
  Filled 2023-06-04: qty 4

## 2023-06-04 MED ORDER — SODIUM CHLORIDE 0.9 % IV SOLN
2.0000 g | Freq: Two times a day (BID) | INTRAVENOUS | Status: DC
  Administered 2023-06-04 – 2023-06-10 (×12): 2 g via INTRAVENOUS
  Filled 2023-06-04 (×13): qty 12.5

## 2023-06-04 MED ORDER — VANCOMYCIN HCL 1250 MG/250ML IV SOLN
1250.0000 mg | INTRAVENOUS | Status: DC
  Filled 2023-06-04: qty 250

## 2023-06-04 MED ORDER — MORPHINE SULFATE (PF) 2 MG/ML IV SOLN: 2.0000 mg | INTRAVENOUS | Status: DC | PRN

## 2023-06-04 MED ORDER — HYDRALAZINE HCL 20 MG/ML IJ SOLN
5.0000 mg | INTRAMUSCULAR | Status: DC | PRN
  Administered 2023-06-09: 5 mg via INTRAVENOUS
  Filled 2023-06-04: qty 1

## 2023-06-04 MED ORDER — INSULIN ASPART 100 UNIT/ML IJ SOLN: 0.0000 [IU] | Freq: Three times a day (TID) | INTRAMUSCULAR | Status: DC

## 2023-06-04 MED ORDER — ACETAMINOPHEN 650 MG RE SUPP: 650.0000 mg | Freq: Four times a day (QID) | RECTAL | Status: DC | PRN

## 2023-06-04 MED ORDER — HYDROCODONE-ACETAMINOPHEN 5-325 MG PO TABS: 1.0000 | ORAL_TABLET | ORAL | Status: DC | PRN

## 2023-06-04 MED ORDER — DEXTROSE-SODIUM CHLORIDE 5-0.9 % IV SOLN: INTRAVENOUS | Status: DC

## 2023-06-04 MED ORDER — APIXABAN 2.5 MG PO TABS
2.5000 mg | ORAL_TABLET | Freq: Two times a day (BID) | ORAL | Status: DC
  Administered 2023-06-04 – 2023-06-05 (×3): 2.5 mg via ORAL
  Filled 2023-06-04 (×3): qty 1

## 2023-06-04 MED ORDER — RAMIPRIL 2.5 MG PO CAPS: 2.5000 mg | ORAL_CAPSULE | Freq: Every evening | ORAL | Status: DC

## 2023-06-04 NOTE — Assessment & Plan Note (Addendum)
    Latest Ref Rng & Units 06/04/2023    3:34 PM 07/07/2020    8:46 AM 02/23/2020    5:55 AM  CBC  WBC 4.0 - 10.5 K/uL 10.2  5.6  7.0   Hemoglobin 13.0 - 17.0 g/dL 29.5  62.1  30.8   Hematocrit 39.0 - 52.0 % 38.7  35.1  36.5   Platelets 150 - 400 K/uL PLATELET CLUMPS NOTED ON SMEAR, UNABLE TO ESTIMATE  193  238   2/2 ACD. Type/screen.  Monitor count.

## 2023-06-04 NOTE — Assessment & Plan Note (Addendum)
IV PPI.

## 2023-06-04 NOTE — ED Notes (Signed)
MD Paduchowski informed of lactic 2.4

## 2023-06-04 NOTE — Assessment & Plan Note (Addendum)
Pt is in sinus rhythm. Cont eliquis.

## 2023-06-04 NOTE — ED Notes (Signed)
Informed RN Dahlia Client) pt has bed assigned @ 19:52

## 2023-06-04 NOTE — Assessment & Plan Note (Addendum)
Pt states he has ulcer on heel that was not there about 10 days ago. He says he has no sensation in his feet from his neuropathy. Pt was seen by podiatry today and referred here for ongoing eval and abx regimen. We will continue vancomycin and cefepime - pharmacy consult.  Podiatry and vascular consult and wound care.

## 2023-06-04 NOTE — ED Provider Triage Note (Signed)
Emergency Medicine Provider Triage Evaluation Note  Carl Yang , a 82 y.o. male  was evaluated in triage.  Pt complains of diabetic wound to his left heel. He was seen by his primary who recommended he come to the ED as the wound had a foul odor. He had an xray of his left foot which was negative for osteomyelitis. Patient's podiatrist believes he will need to be admitted for IV antibiotics.   Review of Systems  Positive: Wound to left heel Negative: pain  Physical Exam  Pulse 66   Temp 99.3 F (37.4 C) (Oral)   Resp 18   Ht 6\' 3"  (1.905 m)   Wt 95.3 kg   SpO2 99%   BMI 26.25 kg/m  Gen:   Awake, no distress   Resp:  Normal effort  MSK:   Moves extremities without difficulty  Other:    Medical Decision Making  Medically screening exam initiated at 3:30 PM.  Appropriate orders placed.  Carl Yang was informed that the remainder of the evaluation will be completed by another provider, this initial triage assessment does not replace that evaluation, and the importance of remaining in the ED until their evaluation is complete.    Cameron Ali, PA-C 06/04/23 1537

## 2023-06-04 NOTE — ED Provider Notes (Signed)
[facility] Provider Note    Event Date/Time   First MD Initiated Contact with Patient 06/04/23 1651     (approximate)  History   Chief Complaint: Wound Check  HPI  Carl Yang is a 82 y.o. male with a past medical history of arthritis, diabetes, peripheral vascular disease, presents to the emergency department for worsening left foot wound.  According to the patient he has been following up with Dr. Ether Griffins for a wound on his left foot.  He has been on antibiotics he states for the last 10 days was seen in the office today and was told that the wound is getting worse and not better so the patient was told to go to the emergency department for admission and IV antibiotics.  Patient denies any fever.  Denies any pain states he has very little sensation in that foot.  Patient has had prior partial amputations of his toes on that foot.  Physical Exam   Triage Vital Signs: ED Triage Vitals  Encounter Vitals Group     BP 06/04/23 1531 (!) 161/48     Systolic BP Percentile --      Diastolic BP Percentile --      Pulse Rate 06/04/23 1528 66     Resp 06/04/23 1528 18     Temp 06/04/23 1528 99.3 F (37.4 C)     Temp Source 06/04/23 1528 Oral     SpO2 06/04/23 1528 99 %     Weight 06/04/23 1527 210 lb (95.3 kg)     Height 06/04/23 1527 6\' 3"  (1.905 m)     Head Circumference --      Peak Flow --      Pain Score 06/04/23 1527 0     Pain Loc --      Pain Education --      Exclude from Growth Chart --     Most recent vital signs: Vitals:   06/04/23 1531 06/04/23 1654  BP: (!) 161/48 130/81  Pulse:  (!) 55  Resp:  16  Temp:  98.5 F (36.9 C)  SpO2:  100%    General: Awake, no distress.  CV:  Good peripheral perfusion.  Regular rate and rhythm  Resp:  Normal effort.  Equal breath sounds bilaterally.  Abd:  No distention.  Soft, nontender.  No rebound or guarding. Other:  Patient has an ulceration approximately 6 cm in diameter to the heel of his  left foot that is foul-smelling with a mild amount of discharge on the dressing.  Foot is warm to the touch with no signs of significant ischemia at least.   ED Results / Procedures / Treatments   RADIOLOGY  Reviewed and interpreted the x-ray images.  There does appear to be a small amount of emphysema around the ulceration but I do not see any obvious bony destruction or signs of osteomyelitis. Podiatry has requested an MRI for further evaluation.   MEDICATIONS ORDERED IN ED: Medications  vancomycin (VANCOCIN) IVPB 1000 mg/200 mL premix (has no administration in time range)     IMPRESSION / MDM / ASSESSMENT AND PLAN / ED COURSE  I reviewed the triage vital signs and the nursing notes.  Patient's presentation is most consistent with acute presentation with potential threat to life or bodily function.  Patient presents emergency department for worsening wound to his left foot.  Patient has been taking antibiotics he has been following up with Dr. Ether Griffins of podiatry and was sent to  the emergency department today for IV antibiotics given worsening ulceration.  We will check labs, cultures, start the patient on IV antibiotics and obtain x-ray images of the foot to rule out gas.  Patient will require admission to the hospital service for further workup and treatment as well as podiatry consultation.  We will discuss with podiatry for any further recommendations.  Patient's lab work shows a mild lactate elevation of 2.4 chemistry shows hyperglycemia although the patient is dosing his own insulin via pump we will IV hydrate.  Patient CBC is reassuring with no leukocytosis.  Blood cultures have been sent.  I spoke to Dr. Ether Griffins who has requested an MRI in addition to the x-ray images as well as consults to podiatry and vascular surgery which I have placed.  We will admit to the hospital service for further workup and treatment.  FINAL CLINICAL IMPRESSION(S) / ED DIAGNOSES   Left foot  wound Cellulitis   Note:  This document was prepared using Dragon voice recognition software and may include unintentional dictation errors.   Minna Antis, MD 06/04/23 1758

## 2023-06-04 NOTE — Assessment & Plan Note (Addendum)
Stable no c/o chest pain.  Cont Eliquis and statin.

## 2023-06-04 NOTE — ED Notes (Signed)
Patient going to MRI

## 2023-06-04 NOTE — ED Triage Notes (Signed)
Patient states sent over by Dr. Ether Griffins for wound to left heel

## 2023-06-04 NOTE — Assessment & Plan Note (Addendum)
Cpap per home settings.

## 2023-06-04 NOTE — Consult Note (Addendum)
Pharmacy Antibiotic Note  Carl Yang is a 82 y.o. male admitted on 06/04/2023 with  foot wound . PMH significant for arthritis, PVD, T1DM, OSA on CPAP, anemia, CKD3, CAD, AF (on warfarin), HLD. He has recently been on doxycycline x10 days for this infection. He was sent from the office to the emergency department for admission and IV antibiotics. Pharmacy has been consulted for vancomycin, cefepime dosing.  Plan: Day 1 of antibiotics Give vancomycin 1000 mg IV x1 now to complete 2000 mg loading dose followed by 1250 mg IV Q24H. Goal AUC 400-550. Expected AUC: 459.9 Expected Css min: 12.2 SCr used: 1.66 (at baseline) Weight used: IBW, Vd used: 0.72 (BMI 26.2) Start cefepime 2 g IV Q12H Continue to monitor renal function and follow culture results   Height: 6\' 3"  (190.5 cm) Weight: 95.3 kg (210 lb) IBW/kg (Calculated) : 84.5  Temp (24hrs), Avg:98.9 F (37.2 C), Min:98.5 F (36.9 C), Max:99.3 F (37.4 C)  Recent Labs  Lab 06/04/23 1534 06/04/23 1711  WBC 10.2  --   CREATININE 1.66*  --   LATICACIDVEN 2.4* 1.6    Estimated Creatinine Clearance: 42.4 mL/min (A) (by C-G formula based on SCr of 1.66 mg/dL (H)).    No Known Allergies  Antimicrobials this admission: 9/17 Vancomycin >>  9/17 Cefepime >>   Dose adjustments this admission: N/A  Microbiology results: 9/17 BCx: IP 9/17 Wound Cx: IP  Thank you for allowing pharmacy to be a part of this patient's care.  Celene Squibb, PharmD Clinical Pharmacist 06/04/2023 7:52 PM

## 2023-06-04 NOTE — H&P (Signed)
History and Physical    Patient: Carl Yang YNW:[id number] DOB: [birth year] DOA: 06/04/2023 DOS: the patient was seen and examined on 06/04/2023 PCP: Marisue Ivan, MD  Patient coming from: Home   Chief Complaint:  Chief Complaint  Patient presents with   Wound Check    HPI: Carl Yang is a 82 y.o. male with medical history significant for a.fib on Eliquis , cad, GERD, DM II, coming with left foot ulcer.  Pt states he did not have this ulcer 10 days ago and it has progressively gotten worse with drainage and foul odor. Pt is otherwise stable and denies any fevers noted some chills today. Pt is doing his insulin.  In ed pt is afebrile and NAD. Blood work shows mild hyponatremia of 132.  Mild AKI with cr of 1.66 ands initial lactic of 2.4 which has resolved to 1.6. Medications  sodium chloride flush (NS) 0.9 % injection 3 mL (3 mLs Intravenous Given 06/04/23 2136)  acetaminophen (TYLENOL) tablet 650 mg (has no administration in time range)    Or  acetaminophen (TYLENOL) suppository 650 mg (has no administration in time range)  HYDROcodone-acetaminophen (NORCO/VICODIN) 5-325 MG per tablet 1 tablet (has no administration in time range)  morphine (PF) 2 MG/ML injection 2 mg (has no administration in time range)  pantoprazole (PROTONIX) injection 40 mg (40 mg Intravenous Given 06/04/23 2136)  vancomycin (VANCOCIN) IVPB 1000 mg/200 mL premix (0 mg Intravenous Paused 06/04/23 2210)  vancomycin (VANCOREADY) IVPB 1250 mg/250 mL (has no administration in time range)  ceFEPIme (MAXIPIME) 2 g in sodium chloride 0.9 % 100 mL IVPB (0 g Intravenous Stopped 06/04/23 2131)  apixaban (ELIQUIS) tablet 2.5 mg (has no administration in time range)  rosuvastatin (CRESTOR) tablet 40 mg (has no administration in time range)  hydrALAZINE (APRESOLINE) injection 5 mg (has no administration in time range)  vancomycin (VANCOCIN) IVPB 1000 mg/200 mL premix (0 mg Intravenous Stopped 06/04/23 1832)    Review of Systems: Review of Systems  Musculoskeletal:        Left heel pain and ulcer wound x 10 days.   Past Medical History:  Diagnosis Date   Arthritis    Atrial fibrillation (HCC)    Cancer (HCC)    Melanoma   Chronic anticoagulation    Warfarin   Complication of anesthesia    After surgery at [facility], the patient coded during the night.   Coronary artery disease    Stents   Diabetes mellitus without complication (HCC)    DVT (deep venous thrombosis) (HCC)    Right lower limb   Dyspnea    GERD (gastroesophageal reflux disease)    Insulin pump in place    Myocardial infarction (HCC)    1998, 2003   Neuromuscular disorder (HCC)    Peripheral Neuropathy r/t DM   Peripheral vascular disease (HCC)    Diabetes   Sleep apnea    Uses CPAP   Past Surgical History:  Procedure Laterality Date   AMPUTATION TOE Left    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   AMPUTATION TOE Left 10/09/2018   Procedure: TOE IPJ 2ND TOE LEFT;  Surgeon: Recardo Evangelist, DPM;  Location: [facility];  Service: Podiatry;  Laterality: Left;  IVA LOCAL Diabeticv - insulin pump   BACK SURGERY     L4 & L5 Fusion   BONE EXCISION Left 07/08/2020   Procedure: EXCISION BONE METATARSAL LEFT;  Surgeon: Gwyneth Revels, DPM;  Location: [facility];  Service: Podiatry;  Laterality: Left;   CARDIAC CATHETERIZATION  2003   CORONARY ANGIOPLASTY WITH STENT PLACEMENT     stents x 2   DIAGNOSTIC LAPAROSCOPY     EYE SURGERY Bilateral    cataracts   PARTIAL KNEE ARTHROPLASTY Left 02/09/2020   Procedure: UNICOMPARTMENTAL KNEE;  Surgeon: Christena Flake, MD;  Location: [facility];  Service: Orthopedics;  Laterality: Left;   TONSILLECTOMY     TOTAL KNEE ARTHROPLASTY Right 07/15/2018   Procedure: TOTAL KNEE ARTHROPLASTY;  Surgeon: Christena Flake, MD;  Location: [facility];  Service: Orthopedics;  Laterality: Right;   TOTAL KNEE REVISION Right 05/05/2019   Procedure: TOTAL KNEE REVISION;  Surgeon: Christena Flake, MD;   Location: [facility];  Service: Orthopedics;  Laterality: Right;   Social History:   reports that he has never smoked. He has never used smokeless tobacco. He reports current alcohol use of about 1.0 standard drink of alcohol per week. He reports that he does not use drugs.  No Known Allergies  History reviewed. No pertinent family history.  Prior to Admission medications   Medication Sig Start Date End Date Taking? Authorizing Provider  amoxicillin-clavulanate (AUGMENTIN) 875-125 MG tablet Take 1 tablet by mouth 2 (two) times daily.    [provider]  atorvastatin (LIPITOR) 80 MG tablet Take 1 tablet (80 mg total) by mouth daily. Patient taking differently: Take 80 mg by mouth every evening.  07/30/18   Sharee Holster, NP  docusate sodium (COLACE) 100 MG capsule Take 100 mg by mouth daily.     [provider]  enoxaparin (LOVENOX) 40 MG/0.4ML injection Inject 0.4 mLs (40 mg total) into the skin daily. 02/11/20   Anson Oregon, PA-C  enoxaparin (LOVENOX) 40 MG/0.4ML injection Inject 40 mg into the skin. LOVENOX BRIDGE-PT TO START LOVENOX THE EVENING OF 10-20 AND THEN 10-21 WILL DO LOVENOX AM AND PM AND NONE THE MORNING OF SURGERY (10-22) AND WILL RESUME HIS COUMADIN THE NIGHT AFTER HIS SURGERY    [provider]  esomeprazole (NEXIUM) 40 MG capsule Take 1 capsule (40 mg total) by mouth daily. Patient taking differently: Take 40 mg by mouth every morning.  07/30/18   Sharee Holster, NP  fexofenadine (ALLEGRA) 180 MG tablet Take 180 mg by mouth every morning.  05/09/18   [provider]  HUMALOG 100 UNIT/ML injection Inject 1.5 mLs (150 Units total) into the skin continuous. Via pump 07/30/18   Sharee Holster, NP  Insulin Human (INSULIN PUMP) SOLN Inject into the skin. INSULIN LISPRO 100U/ML VIAL    [provider]  metFORMIN (GLUCOPHAGE) 500 MG tablet Take 500 mg by mouth 2 (two) times daily. 12/16/19   [provider]  Multiple  Vitamin (MULTIVITAMIN WITH MINERALS) TABS tablet Take 1 tablet by mouth daily. Centrum Silver    [provider]  ramipril (ALTACE) 2.5 MG capsule Take 1 capsule (2.5 mg total) by mouth daily. Patient taking differently: Take 2.5 mg by mouth every evening.  07/30/18   Sharee Holster, NP  warfarin (COUMADIN) 3 MG tablet Take 1 tablet (3 mg total) by mouth daily. Patient takes 3mg  Wed,Sat, Sun Patient taking differently: Take 3 mg by mouth See admin instructions. Take 1 tablet (3 mg) by mouth in the evening on Saturdays & Sundays. 07/30/18   Sharee Holster, NP  warfarin (COUMADIN) 4 MG tablet Take 4 mg by mouth every Monday, Tuesday, Wednesday, Thursday, and Friday. In the evenings.    [provider]  Vitals:   06/04/23 1930 06/04/23 2000 06/04/23 2030 06/04/23 2100  BP: (!) 148/62 (!) 141/56 (!) 156/79 (!) 147/69  Pulse: (!) 51 78 (!) 48 (!) 52  Resp:      Temp:      TempSrc:      SpO2: 98% 96% 99% 98%  Weight:      Height:       Physical Exam Vitals and nursing note reviewed.  Constitutional:      General: He is not in acute distress. HENT:     Head: Normocephalic and atraumatic.     Right Ear: Hearing normal.     Left Ear: Hearing normal.     Nose: Nose normal. No nasal deformity.     Mouth/Throat:     Lips: Pink.     Tongue: No lesions.     Pharynx: Oropharynx is clear.  Eyes:     General: Lids are normal.     Extraocular Movements: Extraocular movements intact.  Cardiovascular:     Rate and Rhythm: Normal rate and regular rhythm.     Pulses:          Dorsalis pedis pulses are 1+ on the right side and 2+ on the left side.       Posterior tibial pulses are 1+ on the right side and 2+ on the left side.     Heart sounds: Normal heart sounds.  Pulmonary:     Effort: Pulmonary effort is normal.     Breath sounds: Normal breath sounds.  Abdominal:     General: Bowel sounds are normal. There is no distension.     Palpations: Abdomen is soft. There is no  mass.     Tenderness: There is no abdominal tenderness.  Musculoskeletal:     Right lower leg: No edema.     Left lower leg: No edema.  Feet:     Left foot:     Skin integrity: Ulcer present.  Skin:    General: Skin is warm.  Neurological:     General: No focal deficit present.     Mental Status: He is alert and oriented to person, place, and time.     Cranial Nerves: Cranial nerves 2-12 are intact.  Psychiatric:        Attention and Perception: Attention normal.        Mood and Affect: Mood normal.        Speech: Speech normal.        Behavior: Behavior normal. Behavior is cooperative.     Labs on Admission: I have personally reviewed following labs and imaging studies. CBC: Recent Labs  Lab 06/04/23 1534  WBC 10.2  NEUTROABS 8.2*  HGB 12.4*  HCT 38.7*  MCV 95.3  PLT PLATELET CLUMPS NOTED ON SMEAR, UNABLE TO ESTIMATE   Basic Metabolic Panel: Recent Labs  Lab 06/04/23 1534  NA 132*  K 5.1  CL 99  CO2 20*  GLUCOSE 352*  BUN 24*  CREATININE 1.66*  CALCIUM 8.4*   GFR: Estimated Creatinine Clearance: 42.4 mL/min (A) (by C-G formula based on SCr of 1.66 mg/dL (H)). Liver Function Tests: Recent Labs  Lab 06/04/23 1534  AST 33  ALT 21  ALKPHOS 98  BILITOT 1.0  PROT 6.9  ALBUMIN 3.2*   No results for input(s): "LIPASE", "AMYLASE" in the last 168 hours. No results for input(s): "AMMONIA" in the last 168 hours. Coagulation Profile: Recent Labs  Lab 06/04/23 2007  INR 1.2   Cardiac Enzymes: No  results for input(s): "CKTOTAL", "CKMB", "CKMBINDEX", "TROPONINI" in the last 168 hours. BNP (last 3 results) No results for input(s): "PROBNP" in the last 8760 hours. HbA1C: No results for input(s): "HGBA1C" in the last 72 hours. CBG: Recent Labs  Lab 06/04/23 1836 06/04/23 2009  GLUCAP 317* 277*   Lipid Profile: No results for input(s): "CHOL", "HDL", "LDLCALC", "TRIG", "CHOLHDL", "LDLDIRECT" in the last 72 hours. Thyroid Function Tests: No results for  input(s): "TSH", "T4TOTAL", "FREET4", "T3FREE", "THYROIDAB" in the last 72 hours. Anemia Panel: No results for input(s): "VITAMINB12", "FOLATE", "FERRITIN", "TIBC", "IRON", "RETICCTPCT" in the last 72 hours. Urinalysis    Component Value Date/Time   COLORURINE YELLOW (A) 02/01/2020 1008   APPEARANCEUR CLEAR (A) 02/01/2020 1008   LABSPEC 1.016 02/01/2020 1008   PHURINE 6.0 02/01/2020 1008   GLUCOSEU >=500 (A) 02/01/2020 1008   HGBUR NEGATIVE 02/01/2020 1008   BILIRUBINUR NEGATIVE 02/01/2020 1008   KETONESUR NEGATIVE 02/01/2020 1008   PROTEINUR 30 (A) 02/01/2020 1008   NITRITE NEGATIVE 02/01/2020 1008   LEUKOCYTESUR NEGATIVE 02/01/2020 1008   Unresulted Labs (From admission, onward)     Start     Ordered   06/05/23 0500  Comprehensive metabolic panel  Tomorrow morning,   R        06/04/23 1937   06/05/23 0500  CBC  Tomorrow morning,   R        06/04/23 1937   06/05/23 0500  Protime-INR  Tomorrow morning,   R        06/04/23 1953   06/04/23 1935  Hemoglobin A1c  Once,   R       Comments: To assess prior glycemic control    06/04/23 1937   06/04/23 1535  Culture, blood (routine x 2)  BLOOD CULTURE X 2,   STAT      06/04/23 1534   06/04/23 1534  Aerobic/Anaerobic Culture w Gram Stain (surgical/deep wound)  Once,   URGENT        06/04/23 1534           Medications  sodium chloride flush (NS) 0.9 % injection 3 mL (3 mLs Intravenous Given 06/04/23 2136)  acetaminophen (TYLENOL) tablet 650 mg (has no administration in time range)    Or  acetaminophen (TYLENOL) suppository 650 mg (has no administration in time range)  HYDROcodone-acetaminophen (NORCO/VICODIN) 5-325 MG per tablet 1 tablet (has no administration in time range)  morphine (PF) 2 MG/ML injection 2 mg (has no administration in time range)  pantoprazole (PROTONIX) injection 40 mg (40 mg Intravenous Given 06/04/23 2136)  vancomycin (VANCOCIN) IVPB 1000 mg/200 mL premix (1,000 mg Intravenous New Bag/Given 06/04/23 2140)   vancomycin (VANCOREADY) IVPB 1250 mg/250 mL (has no administration in time range)  ceFEPIme (MAXIPIME) 2 g in sodium chloride 0.9 % 100 mL IVPB (0 g Intravenous Stopped 06/04/23 2131)  apixaban (ELIQUIS) tablet 2.5 mg (has no administration in time range)  rosuvastatin (CRESTOR) tablet 40 mg (has no administration in time range)  hydrALAZINE (APRESOLINE) injection 5 mg (has no administration in time range)  vancomycin (VANCOCIN) IVPB 1000 mg/200 mL premix (0 mg Intravenous Stopped 06/04/23 1832)   Radiological Exams on Admission: DG Foot Complete Left  Result Date: 06/04/2023 CLINICAL DATA:  Cellulitis.  Draining wound. EXAM: LEFT FOOT - COMPLETE 3 VIEW COMPARISON:  None Available. 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 Underlying osteopenia. Degenerative changes of the midfoot. Plantar calcaneal spur.  Vascular calcifications. Soft tissue swelling about the foot greatest distally. There is also some loss of roundness to the head of second metatarsal. Please correlate for any history such as AVN. No definite erosive changes identified at this time. Please correlate for exact location of open wound. There is some lucency in the soft tissues plantar to the calcaneus posteriorly. Please correlate for soft tissue gas. If there is further concern of bone infection, recommend follow up MRI or bone scan for higher sensitivity. IMPRESSION: No definite erosive changes at this time suggest acute bone infection. Osteopenia with soft tissue swelling and degenerative changes. Previous multifocal amputation. Focal soft tissue gas along the plantar aspect of the foot the level of the calcaneus. Electronically Signed   By: Karen Kays M.D.   On: 06/04/2023 18:23    Data Reviewed: Relevant notes from primary care and specialist visits, past discharge summaries as available in EHR, including Care Everywhere. Prior diagnostic testing  as pertinent to current admission diagnoses Updated medications and problem lists for reconciliation ED course, including vitals, labs, imaging, treatment and response to treatment Triage notes, nursing and pharmacy notes and ED provider's notes Notable results as noted in HPI Assessment & Plan Ulcer of left heel (HCC) Pt states he has ulcer on heel that was not there about 10 days ago. He says he has no sensation in his feet from his neuropathy. Pt was seen by podiatry today and referred here for ongoing eval and abx regimen. We will continue vancomycin and cefepime - pharmacy consult.  Podiatry and vascular consult and wound care.  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 Ramipril held for abnormal kidney function.   OSA on CPAP Cpap per home settings.   GERD without esophagitis IV PPI.   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  Anemia of chronic disease    Latest Ref Rng & Units 06/04/2023    3:34 PM 07/07/2020    8:46 AM 02/23/2020    5:55 AM  CBC  WBC 4.0 - 10.5 K/uL 10.2  5.6  7.0   Hemoglobin 13.0 - 17.0 g/dL 10.2  72.5  36.6   Hematocrit 39.0 - 52.0 % 38.7  35.1  36.5   Platelets 150 - 400 K/uL PLATELET CLUMPS NOTED ON SMEAR, UNABLE TO ESTIMATE  193  238   2/2 ACD. Type/screen.  Monitor count.  CKD (chronic kidney disease) stage 3, GFR 30-59 ml/min (HCC) Lab Results  Component Value Date   CREATININE 1.66 (H) 06/04/2023   CREATININE 1.35 (H) 07/07/2020   CREATININE 1.20 02/23/2020  Mild worsening.  We will avoid contrast.  Renally dose meds.   History of coronary artery stent placement Stable no c/o chest  pain.  Cont Eliquis and statin.  Type 1 diabetes mellitus (HCC) Pt advised to cont using his pump.  We will make him npo after midnight.  Atrial fibrillation (HCC) Pt is in sinus rhythm . Cont eliquis.   DVT prophylaxis:  Eliquis.   Consults:  Podiatry: Dr. Ether Griffins.  Vascular. Sheppard Plumber.   Advance Care Planning:    Code Status: Full Code   Family Communication:  None   Disposition Plan:  Home   Severity of Illness: The appropriate patient status for this patient is INPATIENT. Inpatient status is judged to be reasonable and necessary in order to provide the required intensity of service to ensure the patient's safety. The patient's presenting symptoms, physical exam findings, and initial radiographic and laboratory data in the context of their chronic comorbidities is felt to place them at high risk for further clinical deterioration. Furthermore, it is not anticipated that the patient will be medically stable for discharge from the hospital within 2 midnights of admission.   * I certify that at the point of admission it is my clinical judgment that the patient will require inpatient hospital care spanning beyond 2 midnights from the point of admission due to high intensity of service, high risk for further deterioration and high frequency of surveillance required.*  Author: Gertha Calkin, MD 06/04/2023 9:42 PM  For on call review [email or web address].

## 2023-06-04 NOTE — Assessment & Plan Note (Addendum)
Continue eliquis at 2.5 mg.

## 2023-06-04 NOTE — Assessment & Plan Note (Addendum)
Pt advised to cont using his pump.  We will make him npo after midnight.

## 2023-06-04 NOTE — Assessment & Plan Note (Addendum)
Vitals:   06/04/23 1531 06/04/23 1654 06/04/23 1730 06/04/23 1800  BP: (!) 161/48 130/81 115/72 (!) 145/58   06/04/23 1830 06/04/23 1900 06/04/23 1930 06/04/23 2000  BP: (!) 155/67 (!) 130/91 (!) 148/62 (!) 141/56   06/04/23 2030 06/04/23 2100  BP: (!) 156/79 (!) 147/69  PRN hydralazine. Ramipril held for abnormal kidney function.

## 2023-06-04 NOTE — Assessment & Plan Note (Addendum)
Lab Results  Component Value Date   CREATININE 1.66 (H) 06/04/2023   CREATININE 1.35 (H) 07/07/2020   CREATININE 1.20 02/23/2020  Mild worsening.  We will avoid contrast.  Renally dose meds.

## 2023-06-05 DIAGNOSIS — I825Y1 Chronic embolism and thrombosis of unspecified deep veins of right proximal lower extremity: Secondary | ICD-10-CM

## 2023-06-05 DIAGNOSIS — I739 Peripheral vascular disease, unspecified: Secondary | ICD-10-CM

## 2023-06-05 DIAGNOSIS — E1059 Type 1 diabetes mellitus with other circulatory complications: Secondary | ICD-10-CM

## 2023-06-05 DIAGNOSIS — I152 Hypertension secondary to endocrine disorders: Secondary | ICD-10-CM

## 2023-06-05 DIAGNOSIS — E10621 Type 1 diabetes mellitus with foot ulcer: Secondary | ICD-10-CM

## 2023-06-05 DIAGNOSIS — L97424 Non-pressure chronic ulcer of left heel and midfoot with necrosis of bone: Secondary | ICD-10-CM

## 2023-06-05 DIAGNOSIS — G4733 Obstructive sleep apnea (adult) (pediatric): Secondary | ICD-10-CM

## 2023-06-05 DIAGNOSIS — D638 Anemia in other chronic diseases classified elsewhere: Secondary | ICD-10-CM

## 2023-06-05 DIAGNOSIS — I480 Paroxysmal atrial fibrillation: Secondary | ICD-10-CM | POA: Diagnosis not present

## 2023-06-05 DIAGNOSIS — N183 Chronic kidney disease, stage 3 unspecified: Secondary | ICD-10-CM

## 2023-06-05 DIAGNOSIS — L97509 Non-pressure chronic ulcer of other part of unspecified foot with unspecified severity: Secondary | ICD-10-CM

## 2023-06-05 DIAGNOSIS — Z955 Presence of coronary angioplasty implant and graft: Secondary | ICD-10-CM

## 2023-06-05 LAB — GLUCOSE, CAPILLARY
Glucose-Capillary: 270 mg/dL — ABNORMAL HIGH (ref 70–99)
Glucose-Capillary: 408 mg/dL — ABNORMAL HIGH (ref 70–99)
Glucose-Capillary: 440 mg/dL — ABNORMAL HIGH (ref 70–99)
Glucose-Capillary: 311 mg/dL — ABNORMAL HIGH (ref 70–99)

## 2023-06-05 MED ORDER — CEFAZOLIN SODIUM-DEXTROSE 2-4 GM/100ML-% IV SOLN: 2.0000 g | INTRAVENOUS | Status: DC

## 2023-06-05 MED ORDER — DIPHENHYDRAMINE HCL 50 MG/ML IJ SOLN: 50.0000 mg | Freq: Once | INTRAMUSCULAR | Status: DC | PRN

## 2023-06-05 MED ORDER — FENTANYL CITRATE PF 50 MCG/ML IJ SOSY: 12.5000 ug | PREFILLED_SYRINGE | Freq: Once | INTRAMUSCULAR | Status: DC | PRN

## 2023-06-05 MED ORDER — METHYLPREDNISOLONE SODIUM SUCC 125 MG IJ SOLR: 125.0000 mg | Freq: Once | INTRAMUSCULAR | Status: DC | PRN

## 2023-06-05 MED ORDER — ACETAMINOPHEN 325 MG PO TABS: 650.0000 mg | ORAL_TABLET | Freq: Four times a day (QID) | ORAL | Status: DC | PRN

## 2023-06-05 MED ORDER — INSULIN ASPART 100 UNIT/ML IJ SOLN
0.0000 [IU] | Freq: Three times a day (TID) | INTRAMUSCULAR | Status: DC
  Administered 2023-06-06 – 2023-06-07 (×2): 3 [IU] via SUBCUTANEOUS
  Administered 2023-06-07 – 2023-06-08 (×2): 9 [IU] via SUBCUTANEOUS
  Administered 2023-06-08: 7 [IU] via SUBCUTANEOUS
  Administered 2023-06-08: 2 [IU] via SUBCUTANEOUS
  Administered 2023-06-09: 9 [IU] via SUBCUTANEOUS
  Administered 2023-06-09: 3 [IU] via SUBCUTANEOUS
  Administered 2023-06-09 – 2023-06-10 (×3): 9 [IU] via SUBCUTANEOUS
  Administered 2023-06-10: 1 [IU] via SUBCUTANEOUS
  Administered 2023-06-11 (×2): 5 [IU] via SUBCUTANEOUS
  Administered 2023-06-12: 9 [IU] via SUBCUTANEOUS
  Filled 2023-06-05 (×17): qty 1

## 2023-06-05 MED ORDER — VANCOMYCIN HCL 1500 MG/300ML IV SOLN
1500.0000 mg | INTRAVENOUS | Status: DC
  Administered 2023-06-05 – 2023-06-09 (×5): 1500 mg via INTRAVENOUS
  Filled 2023-06-05 (×6): qty 300

## 2023-06-05 MED ORDER — HYDROMORPHONE HCL 1 MG/ML IJ SOLN: 1.0000 mg | Freq: Once | INTRAMUSCULAR | Status: DC | PRN

## 2023-06-05 MED ORDER — INSULIN GLARGINE-YFGN 100 UNIT/ML ~~LOC~~ SOLN
35.0000 [IU] | Freq: Every day | SUBCUTANEOUS | Status: DC
  Administered 2023-06-05 – 2023-06-06 (×2): 35 [IU] via SUBCUTANEOUS
  Filled 2023-06-05 (×2): qty 0.35

## 2023-06-05 MED ORDER — ACETAMINOPHEN 650 MG RE SUPP: 650.0000 mg | Freq: Four times a day (QID) | RECTAL | Status: DC | PRN

## 2023-06-05 MED ORDER — INSULIN ASPART 100 UNIT/ML IJ SOLN
15.0000 [IU] | Freq: Once | INTRAMUSCULAR | Status: AC
  Administered 2023-06-05: 15 [IU] via SUBCUTANEOUS
  Filled 2023-06-05: qty 1

## 2023-06-05 MED ORDER — INSULIN ASPART 100 UNIT/ML IJ SOLN
16.0000 [IU] | Freq: Once | INTRAMUSCULAR | Status: AC
  Administered 2023-06-05: 16 [IU] via SUBCUTANEOUS
  Filled 2023-06-05: qty 1

## 2023-06-05 MED ORDER — FAMOTIDINE 20 MG PO TABS: 40.0000 mg | ORAL_TABLET | Freq: Once | ORAL | Status: DC | PRN

## 2023-06-05 MED ORDER — SODIUM CHLORIDE 0.9 % IV SOLN: INTRAVENOUS | Status: DC

## 2023-06-05 MED ORDER — ONDANSETRON HCL 4 MG/2ML IJ SOLN: 4.0000 mg | Freq: Four times a day (QID) | INTRAMUSCULAR | Status: DC | PRN

## 2023-06-05 MED ORDER — MIDAZOLAM HCL 2 MG/ML PO SYRP: 8.0000 mg | ORAL_SOLUTION | Freq: Once | ORAL | Status: DC | PRN

## 2023-06-05 NOTE — H&P (View-Only) (Signed)
Hospital Consult    Reason for Consult:  Left Lower Extremity Ulceration Requesting Physician:  Dr. Linus Galas MD MRN #:  [id number]  History of Present Illness: This is a 82 y.o. male with medical history significant for a.fib on Eliquis , cad, GERD, DM II, coming with left foot ulcer. Pt states he did not have this ulcer 10 days ago and it has progressively gotten worse with drainage and foul odor.   On exam this morning patient is resting comfortably in bed.  Patient endorses that his heel hurts mostly when walking.  Patient endorses that he takes his Eliquis 2.5 mg twice a day and has not missed a dose.  Patient endorses that podiatry has seen him and wants to do surgery on his left heel.  Patient denies any chest pain, shortness of breath, dizziness or blurred vision today.  No complaints overnight.  Vitals all remained stable.  Vascular surgery consulted to evaluate patient's left lower extremity.  Past Medical History:  Diagnosis Date   Arthritis    Atrial fibrillation (HCC)    Cancer (HCC)    Melanoma   Chronic anticoagulation    Warfarin   Complication of anesthesia    After surgery at [facility], the patient coded during the night.   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   AMPUTATION TOE Left 10/09/2018   Procedure: TOE IPJ 2ND TOE LEFT;  Surgeon: Recardo Evangelist, DPM;  Location: [facility];  Service: Podiatry;  Laterality: Left;  IVA LOCAL Diabeticv - insulin pump   BACK SURGERY     L4  & L5 Fusion   BONE EXCISION Left 07/08/2020   Procedure: EXCISION BONE METATARSAL LEFT;  Surgeon: Gwyneth Revels, DPM;  Location: [facility];  Service: Podiatry;  Laterality: Left;   CARDIAC CATHETERIZATION  2003   CORONARY ANGIOPLASTY WITH STENT PLACEMENT     stents x 2   DIAGNOSTIC LAPAROSCOPY     EYE SURGERY Bilateral    cataracts   PARTIAL KNEE ARTHROPLASTY Left 02/09/2020   Procedure: UNICOMPARTMENTAL KNEE;  Surgeon: Christena Flake, MD;  Location: [facility] OR[facility];  Service: Orthopedics;  Laterality: Left;   TONSILLECTOMY     TOTAL KNEE ARTHROPLASTY Right 07/15/2018   Procedure: TOTAL KNEE ARTHROPLASTY;  Surgeon: Christena Flake, MD;  Location: [facility];  Service: Orthopedics;  Laterality: Right;   TOTAL KNEE REVISION Right 05/05/2019   Procedure: TOTAL KNEE REVISION;  Surgeon: Christena Flake, MD;  Location: [facility];  Service: Orthopedics;  Laterality: Right;    No Known Allergies  Prior to Admission medications   Medication Sig Start Date End Date Taking? Authorizing Provider  BAQSIMI ONE PACK 3 MG/DOSE POWD One spray in one nostril in case of severe hypoglycemia 05/07/23  Yes [provider]  BESIVANCE 0.6 % SUSP Apply to eye.   Yes [provider]  docusate sodium (COLACE) 100  MG capsule Take 100 mg by mouth daily.    Yes [provider]  ELIQUIS 2.5 MG TABS tablet Take 2.5 mg by mouth 2 (two) times daily.   Yes [provider]  esomeprazole (NEXIUM) 40 MG capsule Take 1 capsule (40 mg total) by mouth daily. Patient taking differently: Take 40 mg by mouth every morning. 07/30/18  Yes Sharee Holster, NP  fexofenadine (ALLEGRA) 180 MG tablet Take 180 mg by mouth every morning.  05/09/18  Yes [provider]  HUMALOG 100 UNIT/ML injection Inject 1.5 mLs (150 Units total) into the skin continuous. Via pump 07/30/18  Yes Sharee Holster, NP  metFORMIN (GLUCOPHAGE) 500 MG tablet Take 500 mg by mouth 2 (two) times daily. 12/16/19  Yes [provider]  Multiple Vitamin (MULTIVITAMIN WITH MINERALS) TABS tablet Take 1 tablet by mouth daily. Centrum Silver   Yes [provider]  ramipril (ALTACE) 2.5 MG capsule Take 1 capsule (2.5 mg total) by mouth daily. Patient taking differently: Take 2.5 mg by mouth every evening. 07/30/18  Yes Sharee Holster, NP  rosuvastatin (CRESTOR) 40 MG tablet Take 40 mg by mouth daily. 04/18/23  Yes [provider]  amoxicillin-clavulanate (AUGMENTIN) 875-125 MG tablet Take 1 tablet by mouth 2 (two) times daily. Patient not taking: Reported on 06/04/2023    [provider]  atorvastatin (LIPITOR) 80 MG tablet Take 1 tablet (80 mg total) by mouth daily. Patient not taking: Reported on 06/04/2023 07/30/18   Sharee Holster, NP  enoxaparin (LOVENOX) 40 MG/0.4ML injection Inject 0.4 mLs (40 mg total) into the skin daily. Patient not taking: Reported on 06/04/2023 02/11/20   Anson Oregon, PA-C  enoxaparin (LOVENOX) 40 MG/0.4ML injection Inject 40 mg into the skin. LOVENOX BRIDGE-PT TO START LOVENOX THE EVENING OF 10-20 AND THEN 10-21 WILL DO LOVENOX AM AND PM AND NONE THE MORNING OF SURGERY (10-22) AND WILL RESUME HIS COUMADIN THE NIGHT AFTER HIS SURGERY    [provider]  warfarin (COUMADIN) 3 MG tablet Take 1 tablet (3 mg total) by mouth daily. Patient takes 3mg  Wed,Sat, Sun Patient not taking: Reported on 06/04/2023 07/30/18   Sharee Holster, NP  warfarin (COUMADIN) 4 MG tablet Take 4 mg by mouth every Monday, Tuesday, Wednesday, Thursday, and Friday. In the evenings. Patient not taking: Reported on 06/04/2023    [provider]    Social History   Socioeconomic History   Marital status: Widowed    Spouse name: Not on file   Number of children: Not on file   Years of education: Not on file   Highest education level: Not on file  Occupational History   Not on file  Tobacco Use   Smoking status: Never   Smokeless tobacco: Never  Vaping Use   Vaping status:  Never Used  Substance and Sexual Activity   Alcohol use: Yes    Alcohol/week: 1.0 standard drink of alcohol    Types: 1 Glasses of wine per week    Comment: Socially   Drug use: Never   Sexual activity: Not Currently  Other Topics Concern   Not on file  Social History Narrative   Not on file   Social Determinants of Health   Financial Resource Strain: Low Risk  (05/07/2023)   Received from [facility]   Overall Financial Resource Strain (CARDIA)    Difficulty of Paying Living Expenses: Not hard at all  Food Insecurity: No Food Insecurity (06/05/2023)  Hunger Vital Sign    Worried About Running Out of Food in the Last Year: Never true    Ran Out of Food in the Last Year: Never true  Transportation Needs: No Transportation Needs (06/05/2023)   PRAPARE - Administrator, Civil Service (Medical): No    Lack of Transportation (Non-Medical): No  Physical Activity: Not on file  Stress: Not on file  Social Connections: Not on file  Intimate Partner Violence: Not At Risk (06/05/2023)   Humiliation, Afraid, Rape, and Kick questionnaire    Fear of Current or Ex-Partner: No    Emotionally Abused: No    Physically Abused: No    Sexually Abused: No     History reviewed. No pertinent family history.  ROS: Otherwise negative unless mentioned in HPI  Physical Examination  Vitals:   06/04/23 2206 06/05/23 0230  BP:  135/64  Pulse: 70 (!) 57  Resp:  16  Temp:  97.9 F (36.6 C)  SpO2:  98%   Body mass index is 26.29 kg/m.  General:  WDWN in NAD Gait: Not observed HENT: WNL, normocephalic Pulmonary: normal non-labored breathing, without Rales, rhonchi,  wheezing Cardiac: irregular Hx of Atrial Fibrillation, without  Murmurs, rubs or gallops; without carotid bruits Abdomen: Positive bowel sounds throughout, soft, NT/ND, no masses Skin: without rashes Vascular Exam/Pulses: Unable to palpate left lower extremity PT/DP. Left foot is cool to touch   Extremities: with ischemic changes, without Gangrene , with cellulitis; with open wounds;  Musculoskeletal: no muscle wasting or atrophy  Neurologic: A&O X 3;  No focal weakness or paresthesias are detected; speech is fluent/normal Psychiatric:  The pt has Normal affect. Lymph:  Unremarkable  CBC    Component Value Date/Time   WBC 7.6 06/05/2023 0641   RBC 3.78 (L) 06/05/2023 0641   HGB 11.8 (L) 06/05/2023 0641   HCT 34.9 (L) 06/05/2023 0641   PLT 175 06/05/2023 0641   MCV 92.3 06/05/2023 0641   MCH 31.2 06/05/2023 0641   MCHC 33.8 06/05/2023 0641   RDW 14.0 06/05/2023 0641   LYMPHSABS 1.2 06/04/2023 1534   MONOABS 0.7 06/04/2023 1534   EOSABS 0.1 06/04/2023 1534   BASOSABS 0.0 06/04/2023 1534    BMET    Component Value Date/Time   NA 132 (L) 06/04/2023 1534   K 5.1 06/04/2023 1534   CL 99 06/04/2023 1534   CO2 20 (L) 06/04/2023 1534   GLUCOSE 352 (H) 06/04/2023 1534   BUN 24 (H) 06/04/2023 1534   CREATININE 1.66 (H) 06/04/2023 1534   CALCIUM 8.4 (L) 06/04/2023 1534   GFRNONAA 41 (L) 06/04/2023 1534   GFRAA >60 02/23/2020 0555    COAGS: Lab Results  Component Value Date   INR 1.3 (H) 06/05/2023   INR 1.2 06/04/2023   INR 2.6 (H) 10/14/2020     Non-Invasive Vascular Imaging:   EXAM:06/05/23 MRI OF THE LEFT ANKLE WITHOUT CONTRAST   TECHNIQUE: Multiplanar, multisequence MR imaging of the ankle was performed. No intravenous contrast was administered.   COMPARISON:  Left foot x-rays from same day.   FINDINGS: TENDONS   Peroneal: Peroneal longus tendon intact. Peroneal brevis intact.   Posteromedial: Posterior tibial tendon intact. Flexor digitorum longus tendon intact. Flexor hallucis longus tendon intact.   Anterior: Tibialis anterior tendon intact. Extensor hallucis longus tendon intact. Extensor digitorum longus tendon intact.   Achilles:  Intact. Mild distal tendinosis.   Plantar Fascia: Intact. Thickened proximal central band.   LIGAMENTS    Lateral: Anterior  talofibular ligament intact. Calcaneofibular ligament intact. Posterior talofibular ligament intact. Anterior and posterior tibiofibular ligaments intact.   Medial: Deltoid ligament intact. Spring ligament intact.   CARTILAGE   Ankle Joint: No joint effusion. Normal ankle mortise. Mild degenerative changes. Tiny focus of marrow edema in the lateral talar dome with overlying cartilage fissuring.   Subtalar Joints/Sinus Tarsi: Mild degenerative changes of the subtalar joints. No subtalar joint effusion. Normal sinus tarsi.   Bones: Abnormal marrow edema with corresponding decreased T1 marrow signal involving plantar aspect of the calcaneal tuberosity. Remaining marrow signal is normal. 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 No fracture or dislocation.   Soft Tissue: Superficial ulceration along the plantar aspect of the heel. No fluid collection. Diffuse soft tissue swelling.   IMPRESSION: 1. Superficial ulceration along the plantar aspect of the heel with underlying osteomyelitis of the plantar calcaneal tuberosity. No abscess.  Statin:  Yes.   Beta Blocker:  No. Aspirin:  No. ACEI:  No. ARB:  No. CCB use:  No Other antiplatelets/anticoagulants:  No. Eliquis 2.5 mg Twice Daily   ASSESSMENT/PLAN: This is a 82 y.o. male who presents to [facility] due to for left lower extremity heel ulceration.  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   PLAN: Vascular surgery plans on taking the patient to the vascular lab on 06/06/2023 for left lower extremity angiogram with possible intervention.  I discussed in detail with the patient the procedure, benefits, risks, and complications.  Patient verbalizes understanding and wishes to proceed as soon as possible.  I answered all the patient's questions this morning.  Patient will be made n.p.o. after midnight on 06/05/2021 for for the procedure.   -I discussed in  detail the plan with Dr. Levora Dredge MD and he agrees with the plan   Marcie Bal [facility] 06/05/2023 7:31 AM

## 2023-06-05 NOTE — H&P (View-Only) (Signed)
Subjective/Chief Complaint: Patient seen.  No specific complaints noted.   Objective: Vital signs in last 24 hours: Temp:  [97.7 F (36.5 C)-99.3 F (37.4 C)] 97.9 F (36.6 C) (09/18 0819) Pulse Rate:  [46-88] 56 (09/18 0819) Resp:  [16-18] 18 (09/18 0819) BP: (115-161)/(48-97) 156/68 (09/18 0819) SpO2:  [94 %-100 %] 100 % (09/18 0819) Weight:  [95.3 kg-95.4 kg] 95.4 kg (09/18 0549) Last BM Date : 06/04/23 (Per Pt)  Intake/Output from previous day: 09/17 0701 - 09/18 0700 In: 3 [I.V.:3] Out: -  Intake/Output this shift: No intake/output data recorded.  Some mild drainage noted on the bandaging.  Full-thickness necrotic and foul-smelling ulceration noted on the plantar aspect of the left heel.  Some erythema noted on the posterior and lateral aspect of the heel.  X-rays reveal some mild gas in the soft tissues around the ulcerative area.  Arterial calcifications noted in the foot and leg.  MRI reveals early osteomyelitis in the plantar aspect of the calcaneus.      Lab Results:  Recent Labs    06/04/23 1534 06/05/23 0641  WBC 10.2 7.6  HGB 12.4* 11.8*  HCT 38.7* 34.9*  PLT PLATELET CLUMPS NOTED ON SMEAR, UNABLE TO ESTIMATE 175   BMET Recent Labs    06/04/23 1534 06/05/23 0641  NA 132* 133*  K 5.1 5.0  CL 99 102  CO2 20* 24  GLUCOSE 352* 385*  BUN 24* 25*  CREATININE 1.66* 1.47*  CALCIUM 8.4* 8.4*   PT/INR Recent Labs    06/04/23 2007 06/05/23 0641  LABPROT 15.1 16.3*  INR 1.2 1.3*   ABG No results for input(s): "PHART", "HCO3" in the last 72 hours.  Invalid input(s): "PCO2", "PO2"  Studies/Results: MR ANKLE LEFT WO CONTRAST  Result Date: 06/05/2023 CLINICAL DATA:  Diabetic left heel wound. EXAM: MRI OF THE LEFT ANKLE WITHOUT CONTRAST TECHNIQUE: Multiplanar, multisequence MR imaging of the ankle was performed. No intravenous contrast was administered. COMPARISON:  Left foot x-rays from same day. FINDINGS: TENDONS Peroneal: Peroneal longus  tendon intact. Peroneal brevis intact. Posteromedial: Posterior tibial tendon intact. Flexor digitorum longus tendon intact. Flexor hallucis longus tendon intact. Anterior: Tibialis anterior tendon intact. Extensor hallucis longus tendon intact. Extensor digitorum longus tendon intact. Achilles:  Intact. Mild distal tendinosis. Plantar Fascia: Intact. Thickened proximal central band. LIGAMENTS Lateral: Anterior talofibular ligament intact. Calcaneofibular ligament intact. Posterior talofibular ligament intact. Anterior and posterior tibiofibular ligaments intact. Medial: Deltoid ligament intact. Spring ligament intact. CARTILAGE Ankle Joint: No joint effusion. Normal ankle mortise. Mild degenerative changes. Tiny focus of marrow edema in the lateral talar dome with overlying cartilage fissuring. Subtalar Joints/Sinus Tarsi: Mild degenerative changes of the subtalar joints. No subtalar joint effusion. Normal sinus tarsi. Bones: Abnormal marrow edema with corresponding decreased T1 marrow signal involving plantar aspect of the calcaneal tuberosity. Remaining marrow signal is normal. 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 No fracture or dislocation. Soft Tissue: Superficial ulceration along the plantar aspect of the heel. No fluid collection. Diffuse soft tissue swelling. IMPRESSION: 1. Superficial ulceration along the plantar aspect of the heel with underlying osteomyelitis of the plantar calcaneal tuberosity. No abscess. Electronically Signed   By: Obie Dredge M.D.   On: 06/05/2023 07:57   DG Foot Complete Left  Result Date: 06/04/2023 CLINICAL DATA:  Cellulitis.  Draining wound. EXAM: LEFT FOOT - COMPLETE 3 VIEW COMPARISON:  None Available. 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  Underlying osteopenia. Degenerative changes of the midfoot. Plantar calcaneal spur. Vascular calcifications. Soft tissue  swelling about the foot greatest distally. There is also some loss of roundness to the head of second metatarsal. Please correlate for any history such as AVN. No definite erosive changes identified at this time. Please correlate for exact location of open wound. There is some lucency in the soft tissues plantar to the calcaneus posteriorly. Please correlate for soft tissue gas. If there is further concern of bone infection, recommend follow up MRI or bone scan for higher sensitivity. IMPRESSION: No definite erosive changes at this time suggest acute bone infection. Osteopenia with soft tissue swelling and degenerative changes. Previous multifocal amputation. Focal soft tissue gas along the plantar aspect of the foot the level of the calcaneus. Electronically Signed   By: Karen Kays M.D.   On: 06/04/2023 18:23    Anti-infectives: Anti-infectives (From admission, onward)    Start     Dose/Rate Route Frequency Ordered Stop   06/05/23 2000  vancomycin (VANCOREADY) IVPB 1250 mg/250 mL  Status:  Discontinued        1,250 mg 166.7 mL/hr over 90 Minutes Intravenous Every 24 hours 06/04/23 1953 06/05/23 1126   06/05/23 2000  vancomycin (VANCOREADY) IVPB 1500 mg/300 mL        1,500 mg 150 mL/hr over 120 Minutes Intravenous Every 24 hours 06/05/23 1126     06/04/23 2000  vancomycin (VANCOCIN) IVPB 1000 mg/200 mL premix        1,000 mg 200 mL/hr over 60 Minutes Intravenous  Once 06/04/23 1953 06/04/23 2341   06/04/23 2000  ceFEPIme (MAXIPIME) 2 g in sodium chloride 0.9 % 100 mL IVPB        2 g 200 mL/hr over 30 Minutes Intravenous Every 12 hours 06/04/23 1953     06/04/23 1715  vancomycin (VANCOCIN) IVPB 1000 mg/200 mL premix        1,000 mg 200 mL/hr over 60 Minutes Intravenous  Once 06/04/23 1701 06/04/23 1832       Assessment/Plan: s/p Procedure(s): Lower Extremity Angiography (Left) Assessment: 1.  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  2.  Diabetes with associated  neuropathy.  Plan: Saline wet-to-dry gauze dressing reapplied to the left heel.  Patient is scheduled for vascular intervention tomorrow.  Podiatry will plan for debridement of the left heel on Friday.  Discussed with the patient the procedure as well as potential complications including but not limited to inability of the wound to heal due to the extent of infection, his diabetes, or circulatory status.  Discussed that with the nature of this wound if it does not heal he could be at risk for amputation of the left lower extremity.  No guarantees could be given as to the outcome of surgery.  Questions invited and answered.  Consent for incision and drainage left heel with debridement of bone.  Patient will be n.p.o. after midnight tomorrow night.  Again plan for surgery on Friday around noon.  LOS: 1 day    Ricci Barker 06/05/2023

## 2023-06-05 NOTE — ED Notes (Signed)
Informed RN-Marlee via phone/ pt has bed assigned

## 2023-06-05 NOTE — Plan of Care (Signed)

## 2023-06-05 NOTE — Progress Notes (Signed)
Pt's HR trending in the mid 40's to low 50's. Pt not in any acute distress and asymptomatic. Cross-cover MD notified. No new orders.

## 2023-06-05 NOTE — Progress Notes (Signed)
Progress Note   Patient: Carl Yang KGM:[id number] DOB: [birth year] DOA: 06/04/2023     1 DOS: the patient was seen and examined on 06/05/2023   Brief hospital course: Carl Yang is a 82 y.o. male with medical history significant for a.fib on Eliquis , cad, GERD, DM II, coming with left foot ulcer.  Pt states he did not have this ulcer 10 days ago and it has progressively gotten worse with drainage and foul odor. No fever, pain. Pt is on insulin pump. In ed pt is afebrile and NAD. Blood work shows mild hyponatremia of 132. Mild AKI with cr of 1.66 ands initial lactic of 2.4 which has resolved to 1.6.  Podiatry and vascular team consulted.  He is admitted to hospitalist service for further management evaluation  Assessment and Plan: * Ulcer of left heel (HCC) Diabetic foot infection Pt was seen by podiatry today and referred here for ongoing eval and abx regimen.  Continue vancomycin and cefepime - pharmacy consult.   Vascular team plan for angiography tomorrow. Podiatry team will plan for debridement Friday, will follow recommendations.  Atrial fibrillation (HCC) Continue telemetry monitoring. Cont eliquis.   Type 1 diabetes mellitus (HCC) Insulin pump is off. Discussed with diabetes nurse. Patient restarted on Lantus 35 units with sliding scale insulin. We will make him npo after midnight for vascular intervention tomorrow morning.   History of coronary artery stent placement Stable no c/o chest pain.  Cont Eliquis and statin.   CKD (chronic kidney disease) stage 3, GFR 30-59 ml/min (HCC) Mild worsening.  We will avoid contrast.  Renally dose meds.  Monitor daily renal function.  Anemia of chronic disease Hemoglobin stable 11.8. Type/screen.  Transfuse hemoglobin less than 7.  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   GERD without esophagitis Continue IV PPI.   OSA on CPAP Home CPAP  ordered.   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 Ramipril held for abnormal kidney function.          Subjective: Patient is seen and examined today morning.  He is lying comfortably, no apparent distress, denies any pain.  Eating fair.  Blood sugars elevated.  Physical Exam: Vitals:   06/04/23 2206 06/05/23 0230 06/05/23 0549 06/05/23 0819  BP:  135/64  (!) 156/68  Pulse: 70 (!) 57  (!) 56  Resp:  16  18  Temp:  97.9 F (36.6 C)  97.9 F (36.6 C)  TempSrc:  Oral  Oral  SpO2:  98%  100%  Weight:   95.4 kg   Height:       General - Elderly Caucasian male, no apparent distress HEENT - PERRLA, EOMI, atraumatic head, non tender sinuses. Lung - Clear, rales, rhonchi, wheezes. Heart - S1, S2 heard, no murmurs, rubs, trace pedal edema Neuro - Alert, awake and oriented x 3, non focal exam. Skin - Warm and dry.  Left heel ulcer with dressing noted Data Reviewed:     Latest Ref Rng & Units 06/05/2023    6:41 AM 06/04/2023    3:34 PM 07/07/2020    8:46 AM  CBC  WBC 4.0 -  10.5 K/uL 7.6  10.2  5.6   Hemoglobin 13.0 - 17.0 g/dL 13.2  44.0  10.2   Hematocrit 39.0 - 52.0 % 34.9  38.7  35.1   Platelets 150 - 400 K/uL 175  PLATELET CLUMPS NOTED ON SMEAR, UNABLE TO ESTIMATE  193       Latest Ref Rng & Units 06/05/2023    6:41 AM 06/04/2023    3:34 PM 07/07/2020    8:46 AM  BMP  Glucose 70 - 99 mg/dL 725  366  440   BUN 8 - 23 mg/dL 25  24  25    Creatinine 0.61 - 1.24 mg/dL 3.47  4.25  9.56   Sodium 135 - 145 mmol/L 133  132  137   Potassium 3.5 - 5.1 mmol/L 5.0  5.1  4.7   Chloride 98 - 111 mmol/L 102  99  106   CO2 22 - 32 mmol/L 24  20  23    Calcium 8.9 - 10.3 mg/dL  8.4  8.4  8.6    MR ANKLE LEFT WO CONTRAST  Result Date: 06/05/2023 CLINICAL DATA:  Diabetic left heel wound. EXAM: MRI OF THE LEFT ANKLE WITHOUT CONTRAST TECHNIQUE: Multiplanar, multisequence MR imaging of the ankle was performed. No intravenous contrast was administered. COMPARISON:  Left foot x-rays from same day. FINDINGS: TENDONS Peroneal: Peroneal longus tendon intact. Peroneal brevis intact. Posteromedial: Posterior tibial tendon intact. Flexor digitorum longus tendon intact. Flexor hallucis longus tendon intact. Anterior: Tibialis anterior tendon intact. Extensor hallucis longus tendon intact. Extensor digitorum longus tendon intact. Achilles:  Intact. Mild distal tendinosis. Plantar Fascia: Intact. Thickened proximal central band. LIGAMENTS Lateral: Anterior talofibular ligament intact. Calcaneofibular ligament intact. Posterior talofibular ligament intact. Anterior and posterior tibiofibular ligaments intact. Medial: Deltoid ligament intact. Spring ligament intact. CARTILAGE Ankle Joint: No joint effusion. Normal ankle mortise. Mild degenerative changes. Tiny focus of marrow edema in the lateral talar dome with overlying cartilage fissuring. Subtalar Joints/Sinus Tarsi: Mild degenerative changes of the subtalar joints. No subtalar joint effusion. Normal sinus tarsi. Bones: Abnormal marrow edema with corresponding decreased T1 marrow signal involving plantar aspect of the calcaneal tuberosity. Remaining marrow signal is normal. 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 No fracture or dislocation. Soft Tissue: Superficial ulceration along the plantar aspect of the heel. No fluid collection. Diffuse soft tissue swelling. IMPRESSION: 1. Superficial ulceration along the plantar aspect of the heel with underlying osteomyelitis of the plantar calcaneal tuberosity. No abscess. Electronically Signed   By: Obie Dredge M.D.   On: 06/05/2023 07:57   DG Foot Complete Left  Result Date:  06/04/2023 CLINICAL DATA:  Cellulitis.  Draining wound. EXAM: LEFT FOOT - COMPLETE 3 VIEW COMPARISON:  None Available. 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 Underlying osteopenia. Degenerative changes of the midfoot. Plantar calcaneal spur. Vascular calcifications. Soft tissue swelling about the foot greatest distally. There is also some loss of roundness to the head of second metatarsal. Please correlate for any history such as AVN. No definite erosive changes identified at this time. Please correlate for exact location of open wound. There is some lucency in the soft tissues plantar to the calcaneus posteriorly. Please correlate for soft tissue gas. If there is further concern of bone infection, recommend follow up MRI or bone scan for higher sensitivity. IMPRESSION: No definite erosive changes at this time suggest acute bone infection. Osteopenia with soft tissue swelling and degenerative changes. Previous multifocal  amputation. Focal soft tissue gas along the plantar aspect of the foot the level of the calcaneus. Electronically Signed   By: Karen Kays M.D.   On: 06/04/2023 18:23     Family Communication: Patient lives alone, understands and agrees with current plan.  Disposition: Status is: Inpatient Remains inpatient appropriate because: Wound infection, IV antibiotics, vascular work up.  Planned Discharge Destination: Home with Home Health    Time spent: 41 minutes  Author: Marcelino Duster, MD 06/05/2023 12:11 PM  For on call review [email or web address].

## 2023-06-05 NOTE — Inpatient Diabetes Management (Addendum)
Inpatient Diabetes Program Recommendations  AACE/ADA: New Consensus Statement on Inpatient Glycemic Control (2015)  Target Ranges:  Prepandial:   less than 140 mg/dL      Peak postprandial:   less than 180 mg/dL (1-2 hours)      Critically ill patients:  140 - 180 mg/dL   Lab Results  Component Value Date   GLUCAP 270 (H) 06/05/2023    Review of Glycemic Control  Latest Reference Range & Units 06/04/23 18:36 06/04/23 20:09 06/04/23 23:22 06/05/23 02:42  Glucose-Capillary 70 - 99 mg/dL 161 (H) 096 (H) 045 (H) 270 (H)  (H): Data is abnormally high  Diabetes history: DM1(does not make insulin.  Needs correction, basal and meal coverage)  Outpatient Diabetes medications:  Endo-Dr. Benny Lennert visit was 05/23/23 He is currently on the T slim control IQ pump: Humalog  Basal rates  Midnight = 1.15  5 AM = 1.85  8:30 AM = 2.5  11 AM = 2.5  2 PM = 2.45  6 PM = 2.5  TDD basal: 50.775 units   Bolus settings  I/C: 4 at midnight, 3 at 11 AM  ISF: 30  Target Glucose: 110  Active insulin time: 5 hours   Current orders for Inpatient glycemic control: Novolog TID  Inpatient Diabetes Program Recommendations:    Please consider:  Semglee 35 units every day (70% of home basal) Novolog 0-9 units Q4H while NPO.  Addendum@0946 :  Spoke with patient at bedside.  His Dexcom G6 along with the transmitter were thrown away in the ED prior to his MRI.  He has his pump with his belongings.  He is current with Dr. Gershon Crane for endocrinology.  Last visit was 05/23/23.  He would like to switch to the G7 which does not require a transmitter.  He will speak with Dr. Gershon Crane.    Will continue to follow while inpatient.  Thank you, Dulce Sellar, MSN, CDCES Diabetes Coordinator Inpatient Diabetes Program [phone] (team pager from 8a-5p)

## 2023-06-05 NOTE — Consult Note (Signed)
Hospital Consult    Reason for Consult:  Left Lower Extremity Ulceration Requesting Physician:  Dr. Linus Galas MD MRN #:  [id number]  History of Present Illness: This is a 82 y.o. male with medical history significant for a.fib on Eliquis , cad, GERD, DM II, coming with left foot ulcer. Pt states he did not have this ulcer 10 days ago and it has progressively gotten worse with drainage and foul odor.   On exam this morning patient is resting comfortably in bed.  Patient endorses that his heel hurts mostly when walking.  Patient endorses that he takes his Eliquis 2.5 mg twice a day and has not missed a dose.  Patient endorses that podiatry has seen him and wants to do surgery on his left heel.  Patient denies any chest pain, shortness of breath, dizziness or blurred vision today.  No complaints overnight.  Vitals all remained stable.  Vascular surgery consulted to evaluate patient's left lower extremity.  Past Medical History:  Diagnosis Date   Arthritis    Atrial fibrillation (HCC)    Cancer (HCC)    Melanoma   Chronic anticoagulation    Warfarin   Complication of anesthesia    After surgery at [facility], the patient coded during the night.   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   AMPUTATION TOE Left 10/09/2018   Procedure: TOE IPJ 2ND TOE LEFT;  Surgeon: Recardo Evangelist, DPM;  Location: [facility];  Service: Podiatry;  Laterality: Left;  IVA LOCAL Diabeticv - insulin pump   BACK SURGERY     L4  & L5 Fusion   BONE EXCISION Left 07/08/2020   Procedure: EXCISION BONE METATARSAL LEFT;  Surgeon: Gwyneth Revels, DPM;  Location: [facility] ORS;  Service: Podiatry;  Laterality: Left;   CARDIAC CATHETERIZATION  2003   CORONARY ANGIOPLASTY WITH STENT PLACEMENT     stents x 2   DIAGNOSTIC LAPAROSCOPY     EYE SURGERY Bilateral    cataracts   PARTIAL KNEE ARTHROPLASTY Left 02/09/2020   Procedure: UNICOMPARTMENTAL KNEE;  Surgeon: Christena Flake, MD;  Location: [facility] ORS;  Service: Orthopedics;  Laterality: Left;   TONSILLECTOMY     TOTAL KNEE ARTHROPLASTY Right 07/15/2018   Procedure: TOTAL KNEE ARTHROPLASTY;  Surgeon: Christena Flake, MD;  Location: [facility] ORS;  Service: Orthopedics;  Laterality: Right;   TOTAL KNEE REVISION Right 05/05/2019   Procedure: TOTAL KNEE REVISION;  Surgeon: Christena Flake, MD;  Location: [facility] OR[facility];  Service: Orthopedics;  Laterality: Right;    No Known Allergies  Prior to Admission medications   Medication Sig Start Date End Date Taking? Authorizing Provider  BAQSIMI ONE PACK 3 MG/DOSE POWD One spray in one nostril in case of severe hypoglycemia 05/07/23  Yes [provider]  BESIVANCE 0.6 % SUSP Apply to eye.   Yes [provider]  docusate sodium (COLACE) 100  MG capsule Take 100 mg by mouth daily.    Yes [provider]  ELIQUIS 2.5 MG TABS tablet Take 2.5 mg by mouth 2 (two) times daily.   Yes [provider]  esomeprazole (NEXIUM) 40 MG capsule Take 1 capsule (40 mg total) by mouth daily. Patient taking differently: Take 40 mg by mouth every morning. 07/30/18  Yes Sharee Holster, NP  fexofenadine (ALLEGRA) 180 MG tablet Take 180 mg by mouth every morning.  05/09/18  Yes [provider]  HUMALOG 100 UNIT/ML injection Inject 1.5 mLs (150 Units total) into the skin continuous. Via pump 07/30/18  Yes Sharee Holster, NP  metFORMIN (GLUCOPHAGE) 500 MG tablet Take 500 mg by mouth 2 (two) times daily. 12/16/19  Yes [provider]  Multiple Vitamin (MULTIVITAMIN WITH MINERALS) TABS tablet Take 1 tablet by mouth daily. Centrum Silver   Yes [provider]  ramipril (ALTACE) 2.5 MG capsule Take 1 capsule (2.5 mg total) by mouth daily. Patient taking differently: Take 2.5 mg by mouth every evening. 07/30/18  Yes Sharee Holster, NP  rosuvastatin (CRESTOR) 40 MG tablet Take 40 mg by mouth daily. 04/18/23  Yes [provider]  amoxicillin-clavulanate (AUGMENTIN) 875-125 MG tablet Take 1 tablet by mouth 2 (two) times daily. Patient not taking: Reported on 06/04/2023    [provider]  atorvastatin (LIPITOR) 80 MG tablet Take 1 tablet (80 mg total) by mouth daily. Patient not taking: Reported on 06/04/2023 07/30/18   Sharee Holster, NP  enoxaparin (LOVENOX) 40 MG/0.4ML injection Inject 0.4 mLs (40 mg total) into the skin daily. Patient not taking: Reported on 06/04/2023 02/11/20   Anson Oregon, PA-C  enoxaparin (LOVENOX) 40 MG/0.4ML injection Inject 40 mg into the skin. LOVENOX BRIDGE-PT TO START LOVENOX THE EVENING OF 10-20 AND THEN 10-21 WILL DO LOVENOX AM AND PM AND NONE THE MORNING OF SURGERY (10-22) AND WILL RESUME HIS COUMADIN THE NIGHT AFTER HIS SURGERY    [provider]  warfarin (COUMADIN) 3 MG tablet Take 1 tablet (3 mg total) by mouth daily. Patient takes 3mg  Wed,Sat, Sun Patient not taking: Reported on 06/04/2023 07/30/18   Sharee Holster, NP  warfarin (COUMADIN) 4 MG tablet Take 4 mg by mouth every Monday, Tuesday, Wednesday, Thursday, and Friday. In the evenings. Patient not taking: Reported on 06/04/2023    [provider]    Social History   Socioeconomic History   Marital status: Widowed    Spouse name: Not on file   Number of children: Not on file   Years of education: Not on file   Highest education level: Not on file  Occupational History   Not on file  Tobacco Use   Smoking status: Never   Smokeless tobacco: Never  Vaping Use   Vaping status:  Never Used  Substance and Sexual Activity   Alcohol use: Yes    Alcohol/week: 1.0 standard drink of alcohol    Types: 1 Glasses of wine per week    Comment: Socially   Drug use: Never   Sexual activity: Not Currently  Other Topics Concern   Not on file  Social History Narrative   Not on file   Social Determinants of Health   Financial Resource Strain: Low Risk  (05/07/2023)   Received from [facility]   Overall Financial Resource Strain (CARDIA)    Difficulty of Paying Living Expenses: Not hard at all  Food Insecurity: No Food Insecurity (06/05/2023)  Hunger Vital Sign    Worried About Running Out of Food in the Last Year: Never true    Ran Out of Food in the Last Year: Never true  Transportation Needs: No Transportation Needs (06/05/2023)   PRAPARE - Administrator, Civil Service (Medical): No    Lack of Transportation (Non-Medical): No  Physical Activity: Not on file  Stress: Not on file  Social Connections: Not on file  Intimate Partner Violence: Not At Risk (06/05/2023)   Humiliation, Afraid, Rape, and Kick questionnaire    Fear of Current or Ex-Partner: No    Emotionally Abused: No    Physically Abused: No    Sexually Abused: No     History reviewed. No pertinent family history.  ROS: Otherwise negative unless mentioned in HPI  Physical Examination  Vitals:   06/04/23 2206 06/05/23 0230  BP:  135/64  Pulse: 70 (!) 57  Resp:  16  Temp:  97.9 F (36.6 C)  SpO2:  98%   Body mass index is 26.29 kg/m.  General:  WDWN in NAD Gait: Not observed HENT: WNL, normocephalic Pulmonary: normal non-labored breathing, without Rales, rhonchi,  wheezing Cardiac: irregular Hx of Atrial Fibrillation, without  Murmurs, rubs or gallops; without carotid bruits Abdomen: Positive bowel sounds throughout, soft, NT/ND, no masses Skin: without rashes Vascular Exam/Pulses: Unable to palpate left lower extremity PT/DP. Left foot is cool to touch   Extremities: with ischemic changes, without Gangrene , with cellulitis; with open wounds;  Musculoskeletal: no muscle wasting or atrophy  Neurologic: A&O X 3;  No focal weakness or paresthesias are detected; speech is fluent/normal Psychiatric:  The pt has Normal affect. Lymph:  Unremarkable  CBC    Component Value Date/Time   WBC 7.6 06/05/2023 0641   RBC 3.78 (L) 06/05/2023 0641   HGB 11.8 (L) 06/05/2023 0641   HCT 34.9 (L) 06/05/2023 0641   PLT 175 06/05/2023 0641   MCV 92.3 06/05/2023 0641   MCH 31.2 06/05/2023 0641   MCHC 33.8 06/05/2023 0641   RDW 14.0 06/05/2023 0641   LYMPHSABS 1.2 06/04/2023 1534   MONOABS 0.7 06/04/2023 1534   EOSABS 0.1 06/04/2023 1534   BASOSABS 0.0 06/04/2023 1534    BMET    Component Value Date/Time   NA 132 (L) 06/04/2023 1534   K 5.1 06/04/2023 1534   CL 99 06/04/2023 1534   CO2 20 (L) 06/04/2023 1534   GLUCOSE 352 (H) 06/04/2023 1534   BUN 24 (H) 06/04/2023 1534   CREATININE 1.66 (H) 06/04/2023 1534   CALCIUM 8.4 (L) 06/04/2023 1534   GFRNONAA 41 (L) 06/04/2023 1534   GFRAA >60 02/23/2020 0555    COAGS: Lab Results  Component Value Date   INR 1.3 (H) 06/05/2023   INR 1.2 06/04/2023   INR 2.6 (H) 10/14/2020     Non-Invasive Vascular Imaging:   EXAM:06/05/23 MRI OF THE LEFT ANKLE WITHOUT CONTRAST   TECHNIQUE: Multiplanar, multisequence MR imaging of the ankle was performed. No intravenous contrast was administered.   COMPARISON:  Left foot x-rays from same day.   FINDINGS: TENDONS   Peroneal: Peroneal longus tendon intact. Peroneal brevis intact.   Posteromedial: Posterior tibial tendon intact. Flexor digitorum longus tendon intact. Flexor hallucis longus tendon intact.   Anterior: Tibialis anterior tendon intact. Extensor hallucis longus tendon intact. Extensor digitorum longus tendon intact.   Achilles:  Intact. Mild distal tendinosis.   Plantar Fascia: Intact. Thickened proximal central band.   LIGAMENTS    Lateral: Anterior  talofibular ligament intact. Calcaneofibular ligament intact. Posterior talofibular ligament intact. Anterior and posterior tibiofibular ligaments intact.   Medial: Deltoid ligament intact. Spring ligament intact.   CARTILAGE   Ankle Joint: No joint effusion. Normal ankle mortise. Mild degenerative changes. Tiny focus of marrow edema in the lateral talar dome with overlying cartilage fissuring.   Subtalar Joints/Sinus Tarsi: Mild degenerative changes of the subtalar joints. No subtalar joint effusion. Normal sinus tarsi.   Bones: Abnormal marrow edema with corresponding decreased T1 marrow signal involving plantar aspect of the calcaneal tuberosity. Remaining marrow signal is normal. 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 No fracture or dislocation.   Soft Tissue: Superficial ulceration along the plantar aspect of the heel. No fluid collection. Diffuse soft tissue swelling.   IMPRESSION: 1. Superficial ulceration along the plantar aspect of the heel with underlying osteomyelitis of the plantar calcaneal tuberosity. No abscess.  Statin:  Yes.   Beta Blocker:  No. Aspirin:  No. ACEI:  No. ARB:  No. CCB use:  No Other antiplatelets/anticoagulants:  No. Eliquis 2.5 mg Twice Daily   ASSESSMENT/PLAN: This is a 82 y.o. male who presents to [facility] due to for left lower extremity heel ulceration.  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   PLAN: Vascular surgery plans on taking the patient to the vascular lab on 06/06/2023 for left lower extremity angiogram with possible intervention.  I discussed in detail with the patient the procedure, benefits, risks, and complications.  Patient verbalizes understanding and wishes to proceed as soon as possible.  I answered all the patient's questions this morning.  Patient will be made n.p.o. after midnight on 06/05/2021 for for the procedure.   -I discussed in  detail the plan with Dr. Levora Dredge MD and he agrees with the plan   Marcie Bal [facility] 06/05/2023 7:31 AM

## 2023-06-05 NOTE — Progress Notes (Signed)
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 Notified cross-cover MD. No new orders. Pt is asymptomatic and not in any acute distress.

## 2023-06-05 NOTE — Progress Notes (Signed)
Subjective/Chief Complaint: Patient seen.  No specific complaints noted.   Objective: Vital signs in last 24 hours: Temp:  [97.7 F (36.5 C)-99.3 F (37.4 C)] 97.9 F (36.6 C) (09/18 0819) Pulse Rate:  [46-88] 56 (09/18 0819) Resp:  [16-18] 18 (09/18 0819) BP: (115-161)/(48-97) 156/68 (09/18 0819) SpO2:  [94 %-100 %] 100 % (09/18 0819) Weight:  [95.3 kg-95.4 kg] 95.4 kg (09/18 0549) Last BM Date : 06/04/23 (Per Pt)  Intake/Output from previous day: 09/17 0701 - 09/18 0700 In: 3 [I.V.:3] Out: -  Intake/Output this shift: No intake/output data recorded.  Some mild drainage noted on the bandaging.  Full-thickness necrotic and foul-smelling ulceration noted on the plantar aspect of the left heel.  Some erythema noted on the posterior and lateral aspect of the heel.  X-rays reveal some mild gas in the soft tissues around the ulcerative area.  Arterial calcifications noted in the foot and leg.  MRI reveals early osteomyelitis in the plantar aspect of the calcaneus.      Lab Results:  Recent Labs    06/04/23 1534 06/05/23 0641  WBC 10.2 7.6  HGB 12.4* 11.8*  HCT 38.7* 34.9*  PLT PLATELET CLUMPS NOTED ON SMEAR, UNABLE TO ESTIMATE 175   BMET Recent Labs    06/04/23 1534 06/05/23 0641  NA 132* 133*  K 5.1 5.0  CL 99 102  CO2 20* 24  GLUCOSE 352* 385*  BUN 24* 25*  CREATININE 1.66* 1.47*  CALCIUM 8.4* 8.4*   PT/INR Recent Labs    06/04/23 2007 06/05/23 0641  LABPROT 15.1 16.3*  INR 1.2 1.3*   ABG No results for input(s): "PHART", "HCO3" in the last 72 hours.  Invalid input(s): "PCO2", "PO2"  Studies/Results: MR ANKLE LEFT WO CONTRAST  Result Date: 06/05/2023 CLINICAL DATA:  Diabetic left heel wound. EXAM: MRI OF THE LEFT ANKLE WITHOUT CONTRAST TECHNIQUE: Multiplanar, multisequence MR imaging of the ankle was performed. No intravenous contrast was administered. COMPARISON:  Left foot x-rays from same day. FINDINGS: TENDONS Peroneal: Peroneal longus  tendon intact. Peroneal brevis intact. Posteromedial: Posterior tibial tendon intact. Flexor digitorum longus tendon intact. Flexor hallucis longus tendon intact. Anterior: Tibialis anterior tendon intact. Extensor hallucis longus tendon intact. Extensor digitorum longus tendon intact. Achilles:  Intact. Mild distal tendinosis. Plantar Fascia: Intact. Thickened proximal central band. LIGAMENTS Lateral: Anterior talofibular ligament intact. Calcaneofibular ligament intact. Posterior talofibular ligament intact. Anterior and posterior tibiofibular ligaments intact. Medial: Deltoid ligament intact. Spring ligament intact. CARTILAGE Ankle Joint: No joint effusion. Normal ankle mortise. Mild degenerative changes. Tiny focus of marrow edema in the lateral talar dome with overlying cartilage fissuring. Subtalar Joints/Sinus Tarsi: Mild degenerative changes of the subtalar joints. No subtalar joint effusion. Normal sinus tarsi. Bones: Abnormal marrow edema with corresponding decreased T1 marrow signal involving plantar aspect of the calcaneal tuberosity. Remaining marrow signal is normal. 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 No fracture or dislocation. Soft Tissue: Superficial ulceration along the plantar aspect of the heel. No fluid collection. Diffuse soft tissue swelling. IMPRESSION: 1. Superficial ulceration along the plantar aspect of the heel with underlying osteomyelitis of the plantar calcaneal tuberosity. No abscess. Electronically Signed   By: Obie Dredge M.D.   On: 06/05/2023 07:57   DG Foot Complete Left  Result Date: 06/04/2023 CLINICAL DATA:  Cellulitis.  Draining wound. EXAM: LEFT FOOT - COMPLETE 3 VIEW COMPARISON:  None Available. 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  Underlying osteopenia. Degenerative changes of the midfoot. Plantar calcaneal spur. Vascular calcifications. Soft tissue  swelling about the foot greatest distally. There is also some loss of roundness to the head of second metatarsal. Please correlate for any history such as AVN. No definite erosive changes identified at this time. Please correlate for exact location of open wound. There is some lucency in the soft tissues plantar to the calcaneus posteriorly. Please correlate for soft tissue gas. If there is further concern of bone infection, recommend follow up MRI or bone scan for higher sensitivity. IMPRESSION: No definite erosive changes at this time suggest acute bone infection. Osteopenia with soft tissue swelling and degenerative changes. Previous multifocal amputation. Focal soft tissue gas along the plantar aspect of the foot the level of the calcaneus. Electronically Signed   By: Karen Kays M.D.   On: 06/04/2023 18:23    Anti-infectives: Anti-infectives (From admission, onward)    Start     Dose/Rate Route Frequency Ordered Stop   06/05/23 2000  vancomycin (VANCOREADY) IVPB 1250 mg/250 mL  Status:  Discontinued        1,250 mg 166.7 mL/hr over 90 Minutes Intravenous Every 24 hours 06/04/23 1953 06/05/23 1126   06/05/23 2000  vancomycin (VANCOREADY) IVPB 1500 mg/300 mL        1,500 mg 150 mL/hr over 120 Minutes Intravenous Every 24 hours 06/05/23 1126     06/04/23 2000  vancomycin (VANCOCIN) IVPB 1000 mg/200 mL premix        1,000 mg 200 mL/hr over 60 Minutes Intravenous  Once 06/04/23 1953 06/04/23 2341   06/04/23 2000  ceFEPIme (MAXIPIME) 2 g in sodium chloride 0.9 % 100 mL IVPB        2 g 200 mL/hr over 30 Minutes Intravenous Every 12 hours 06/04/23 1953     06/04/23 1715  vancomycin (VANCOCIN) IVPB 1000 mg/200 mL premix        1,000 mg 200 mL/hr over 60 Minutes Intravenous  Once 06/04/23 1701 06/04/23 1832       Assessment/Plan: s/p Procedure(s): Lower Extremity Angiography (Left) Assessment: 1.  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  2.  Diabetes with associated  neuropathy.  Plan: Saline wet-to-dry gauze dressing reapplied to the left heel.  Patient is scheduled for vascular intervention tomorrow.  Podiatry will plan for debridement of the left heel on Friday.  Discussed with the patient the procedure as well as potential complications including but not limited to inability of the wound to heal due to the extent of infection, his diabetes, or circulatory status.  Discussed that with the nature of this wound if it does not heal he could be at risk for amputation of the left lower extremity.  No guarantees could be given as to the outcome of surgery.  Questions invited and answered.  Consent for incision and drainage left heel with debridement of bone.  Patient will be n.p.o. after midnight tomorrow night.  Again plan for surgery on Friday around noon.  LOS: 1 day    Ricci Barker 06/05/2023

## 2023-06-05 NOTE — Consult Note (Signed)
Pharmacy Antibiotic Note  Carl Yang is a 82 y.o. male admitted on 06/04/2023 with  foot wound . PMH significant for arthritis, PVD, T1DM, OSA on CPAP, anemia, CKD3, CAD, AF (on warfarin), HLD. He has recently been on doxycycline x10 days for this infection. He was sent from the office to the emergency department for admission and IV antibiotics. Pharmacy has been consulted for vancomycin, cefepime dosing.  Plan: Day 2 of antibiotics Change vancomycin to 1500 mg IV every 24 hours Expected AUC: 494.5 Expected Css min: 12.4 SCr used: 1.47 Weight used: IBW, Vd used: 0.72 (BMI 26.2) Continue cefepime 2 g IV Q12H Continue to monitor renal function and follow culture results   Height: 6\' 3"  (190.5 cm) Weight: 95.4 kg (210 lb 5.1 oz) IBW/kg (Calculated) : 84.5  Temp (24hrs), Avg:98.3 F (36.8 C), Min:97.7 F (36.5 C), Max:99.3 F (37.4 C)  Recent Labs  Lab 06/04/23 1534 06/04/23 1711 06/05/23 0641  WBC 10.2  --  7.6  CREATININE 1.66*  --  1.47*  LATICACIDVEN 2.4* 1.6  --     Estimated Creatinine Clearance: 47.9 mL/min (A) (by C-G formula based on SCr of 1.47 mg/dL (H)).    No Known Allergies  Antimicrobials this admission: 9/17 Vancomycin >>  9/17 Cefepime >>   Dose adjustments this admission: N/A  Microbiology results: 9/17 BCx: IP 9/17 Wound Cx: IP  Thank you for allowing pharmacy to be a part of this patient's care.  Barrie Folk, PharmD Clinical Pharmacist 06/05/2023 11:26 AM

## 2023-06-05 NOTE — Progress Notes (Signed)
Pt has CPAP at home but didn't bring his machine in. Pt stated "has gotten use to sleeping with his mask and would not want to use one of ours"

## 2023-06-06 ENCOUNTER — Encounter
Admission: EM | Disposition: A | Discharge status: Skilled Nursing Facility | Payer: Self-pay | Source: Ambulatory Visit | Attending: Internal Medicine

## 2023-06-06 ENCOUNTER — Encounter: Payer: Self-pay | Admitting: Internal Medicine

## 2023-06-06 DIAGNOSIS — I70201 Unspecified atherosclerosis of native arteries of extremities, right leg: Secondary | ICD-10-CM | POA: Diagnosis not present

## 2023-06-06 DIAGNOSIS — L97429 Non-pressure chronic ulcer of left heel and midfoot with unspecified severity: Secondary | ICD-10-CM | POA: Diagnosis not present

## 2023-06-06 DIAGNOSIS — I825Y1 Chronic embolism and thrombosis of unspecified deep veins of right proximal lower extremity: Secondary | ICD-10-CM | POA: Diagnosis not present

## 2023-06-06 DIAGNOSIS — L97424 Non-pressure chronic ulcer of left heel and midfoot with necrosis of bone: Secondary | ICD-10-CM | POA: Diagnosis not present

## 2023-06-06 DIAGNOSIS — I70244 Atherosclerosis of native arteries of left leg with ulceration of heel and midfoot: Secondary | ICD-10-CM | POA: Diagnosis not present

## 2023-06-06 DIAGNOSIS — D638 Anemia in other chronic diseases classified elsewhere: Secondary | ICD-10-CM | POA: Diagnosis not present

## 2023-06-06 DIAGNOSIS — I480 Paroxysmal atrial fibrillation: Secondary | ICD-10-CM | POA: Diagnosis not present

## 2023-06-06 HISTORY — PX: LOWER EXTREMITY ANGIOGRAPHY: CATH118251

## 2023-06-06 LAB — GLUCOSE, CAPILLARY
Glucose-Capillary: 137 mg/dL — ABNORMAL HIGH (ref 70–99)
Glucose-Capillary: 244 mg/dL — ABNORMAL HIGH (ref 70–99)
Glucose-Capillary: 340 mg/dL — ABNORMAL HIGH (ref 70–99)
Glucose-Capillary: 445 mg/dL — ABNORMAL HIGH (ref 70–99)
Glucose-Capillary: 480 mg/dL — ABNORMAL HIGH (ref 70–99)

## 2023-06-06 LAB — HEMOGLOBIN A1C
Hgb A1c MFr Bld: 9.1 % — ABNORMAL HIGH (ref 4.8–5.6)
Hgb A1c MFr Bld: 8.9 % — ABNORMAL HIGH (ref 4.8–5.6)
Mean Plasma Glucose: 214 mg/dL
Mean Plasma Glucose: 209 mg/dL

## 2023-06-06 LAB — CREATININE, SERUM
Creatinine, Ser: 1.46 mg/dL — ABNORMAL HIGH (ref 0.61–1.24)
GFR, Estimated: 48 mL/min — ABNORMAL LOW (ref >60)

## 2023-06-06 LAB — GLUCOSE, RANDOM: Glucose, Bld: 434 mg/dL — ABNORMAL HIGH (ref 70–99)

## 2023-06-06 SURGERY — LOWER EXTREMITY ANGIOGRAPHY
Anesthesia: Moderate Sedation | Laterality: Left

## 2023-06-06 MED ORDER — HEPARIN SODIUM (PORCINE) 1000 UNIT/ML IJ SOLN
INTRAMUSCULAR | Status: AC
  Filled 2023-06-06: qty 10

## 2023-06-06 MED ORDER — SODIUM CHLORIDE 0.9 % IV SOLN: INTRAVENOUS | Status: DC

## 2023-06-06 MED ORDER — DIPHENHYDRAMINE HCL 50 MG/ML IJ SOLN: 50.0000 mg | Freq: Once | INTRAMUSCULAR | Status: DC | PRN

## 2023-06-06 MED ORDER — ONDANSETRON HCL 4 MG/2ML IJ SOLN: 4.0000 mg | Freq: Four times a day (QID) | INTRAMUSCULAR | Status: DC | PRN

## 2023-06-06 MED ORDER — SODIUM CHLORIDE 0.9% FLUSH: 3.0000 mL | INTRAVENOUS | Status: DC | PRN

## 2023-06-06 MED ORDER — LIDOCAINE HCL (PF) 1 % IJ SOLN
INTRAMUSCULAR | Status: DC | PRN
  Administered 2023-06-06: 10 mL

## 2023-06-06 MED ORDER — FENTANYL CITRATE (PF) 100 MCG/2ML IJ SOLN
INTRAMUSCULAR | Status: DC | PRN
  Administered 2023-06-06: 50 ug via INTRAVENOUS

## 2023-06-06 MED ORDER — OXYCODONE HCL 5 MG PO TABS: 5.0000 mg | ORAL_TABLET | ORAL | Status: DC | PRN

## 2023-06-06 MED ORDER — INSULIN GLARGINE-YFGN 100 UNIT/ML ~~LOC~~ SOLN
50.0000 [IU] | Freq: Every day | SUBCUTANEOUS | Status: DC
  Administered 2023-06-07 – 2023-06-10 (×4): 50 [IU] via SUBCUTANEOUS
  Filled 2023-06-06 (×6): qty 0.5

## 2023-06-06 MED ORDER — SODIUM CHLORIDE 0.9% FLUSH
3.0000 mL | Freq: Two times a day (BID) | INTRAVENOUS | Status: DC
  Administered 2023-06-06 – 2023-06-12 (×13): 3 mL via INTRAVENOUS

## 2023-06-06 MED ORDER — MIDAZOLAM HCL 2 MG/2ML IJ SOLN
INTRAMUSCULAR | Status: DC | PRN
  Administered 2023-06-06 (×2): 1 mg via INTRAVENOUS

## 2023-06-06 MED ORDER — MORPHINE SULFATE (PF) 4 MG/ML IV SOLN: 2.0000 mg | INTRAVENOUS | Status: DC | PRN

## 2023-06-06 MED ORDER — IODIXANOL 320 MG/ML IV SOLN
INTRAVENOUS | Status: DC | PRN
  Administered 2023-06-06: 50 mL

## 2023-06-06 MED ORDER — INSULIN ASPART 100 UNIT/ML IJ SOLN
15.0000 [IU] | Freq: Once | INTRAMUSCULAR | Status: AC
  Administered 2023-06-06: 15 [IU] via SUBCUTANEOUS
  Filled 2023-06-06: qty 1

## 2023-06-06 MED ORDER — SODIUM CHLORIDE 0.9 % IV SOLN
INTRAVENOUS | Status: DC | PRN
  Administered 2023-06-06: 125 mL/h via INTRAVENOUS

## 2023-06-06 MED ORDER — FAMOTIDINE 20 MG PO TABS: 40.0000 mg | ORAL_TABLET | Freq: Once | ORAL | Status: DC | PRN

## 2023-06-06 MED ORDER — SODIUM CHLORIDE 0.9 % IV SOLN: 250.0000 mL | INTRAVENOUS | Status: DC | PRN

## 2023-06-06 MED ORDER — MIDAZOLAM HCL 2 MG/ML PO SYRP: 8.0000 mg | ORAL_SOLUTION | Freq: Once | ORAL | Status: DC | PRN

## 2023-06-06 MED ORDER — HEPARIN SODIUM (PORCINE) 1000 UNIT/ML IJ SOLN
INTRAMUSCULAR | Status: DC | PRN
  Administered 2023-06-06: 6000 [IU] via INTRAVENOUS

## 2023-06-06 MED ORDER — INSULIN ASPART 100 UNIT/ML IJ SOLN
15.0000 [IU] | Freq: Once | INTRAMUSCULAR | Status: AC
  Administered 2023-06-06: 15 [IU] via SUBCUTANEOUS

## 2023-06-06 MED ORDER — CEFAZOLIN SODIUM-DEXTROSE 2-4 GM/100ML-% IV SOLN: 2.0000 g | INTRAVENOUS | Status: DC

## 2023-06-06 MED ORDER — MIDAZOLAM HCL 5 MG/5ML IJ SOLN
INTRAMUSCULAR | Status: AC
  Filled 2023-06-06: qty 5

## 2023-06-06 MED ORDER — ACETAMINOPHEN 325 MG PO TABS: 650.0000 mg | ORAL_TABLET | ORAL | Status: DC | PRN

## 2023-06-06 MED ORDER — FENTANYL CITRATE (PF) 100 MCG/2ML IJ SOLN
INTRAMUSCULAR | Status: AC
  Filled 2023-06-06: qty 2

## 2023-06-06 MED ORDER — FENTANYL CITRATE (PF) 100 MCG/2ML IJ SOLN: 12.5000 ug | Freq: Once | INTRAMUSCULAR | Status: DC | PRN

## 2023-06-06 MED ORDER — HYDROMORPHONE HCL 1 MG/ML IJ SOLN: 1.0000 mg | Freq: Once | INTRAMUSCULAR | Status: DC | PRN

## 2023-06-06 MED ORDER — SODIUM CHLORIDE 0.9 % IV SOLN: INTRAVENOUS | Status: AC

## 2023-06-06 MED ORDER — HEPARIN (PORCINE) IN NACL 1000-0.9 UT/500ML-% IV SOLN
INTRAVENOUS | Status: DC | PRN
  Administered 2023-06-06: 1000 mL

## 2023-06-06 MED ORDER — METHYLPREDNISOLONE SODIUM SUCC 125 MG IJ SOLR: 125.0000 mg | Freq: Once | INTRAMUSCULAR | Status: DC | PRN

## 2023-06-06 SURGICAL SUPPLY — 14 items
CATH ANGIO 5F PIGTAIL 65CM (CATHETERS) IMPLANT
COVER PROBE ULTRASOUND 5X96 (MISCELLANEOUS) IMPLANT
DEVICE STARCLOSE SE CLOSURE (Vascular Products) IMPLANT
GLIDEWIRE ADV .035X260CM (WIRE) IMPLANT
GOWN STRL REUS W/ TWL LRG LVL3 (GOWN DISPOSABLE) ×1 IMPLANT
GOWN STRL REUS W/TWL LRG LVL3 (GOWN DISPOSABLE) ×1
NDL ENTRY 21GA 7CM ECHOTIP (NEEDLE) IMPLANT
NEEDLE ENTRY 21GA 7CM ECHOTIP (NEEDLE) ×1 IMPLANT
PACK ANGIOGRAPHY (CUSTOM PROCEDURE TRAY) ×1 IMPLANT
SET INTRO CAPELLA COAXIAL (SET/KITS/TRAYS/PACK) IMPLANT
SHEATH BRITE TIP 5FRX11 (SHEATH) IMPLANT
SYR MEDRAD MARK 7 150ML (SYRINGE) IMPLANT
TUBING CONTRAST HIGH PRESS 72 (TUBING) IMPLANT
WIRE GUIDERIGHT .035X150 (WIRE) IMPLANT

## 2023-06-06 NOTE — Progress Notes (Signed)
CROSS COVER NOTE  NAME: Carl Yang MRN: [id number] DOB : 13-Sep-1941    Concern as stated by nurse / staff   Good night patient mentioned above,admitted 82 years old Male Full Code admitted on Sept 18 for Ulcer of left heel,hx of DM ,he is Q4 hours blood sugar check.Blood sugar at 2000 was 445,he came down a bit from 480 @ 1700,they gave him 15 units of novolog at 1718.Please review when you get a chance.       Pertinent findings on chart review:   Assessment and  Interventions   Assessment: Venous glucose 434 Plan: Insulin 15 units x1 dose SQ       Donnie Mesa NP [facility] Cross Cover 7pm-7am - check amion for availability Pager [phone]

## 2023-06-06 NOTE — Op Note (Signed)
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  Abdominal aortogram  2.  Selective injection of the left lower extremity third order catheter placement  3.  Ultrasound-guided access to the right common femoral artery  4.  StarClose right femoral artery    Anesthesia: Conscious sedation was administered by the interventional radiology RN under my direct supervision. IV Versed plus fentanyl were utilized. Continuous ECG, pulse oximetry and blood pressure was monitored throughout the entire procedure.  Conscious sedation was administered for a total of 23 minutes.  Sheath: 5 French 11 cm Pinnacle sheath retrograde right common femoral  Contrast: 50 cc   Fluoroscopy Time: 2.2 minutes  Indications:  The patient presents to [facility] with atherosclerotic occlusive disease bilateral lower extremities with ulceration of the left heel.  Pedal pulses are nonpalpable bilaterally suggesting hemodynamically significant atherosclerotic occlusive disease.  The risks and benefits as well as alternative therapies for lower extremity revascularization are reviewed with the patient all questions are answered the patient agrees to proceed.  The patient is therefore undergoing angiography with the hope for intervention for limb salvage.   Procedure:  Carl Yang [facility] a 82 y.o. male who was identified and appropriate procedural time out was performed.  The patient was then placed supine on the table and prepped and draped in the usual sterile fashion.  Ultrasound was used to evaluate the right common femoral artery.  It was echolucent and pulsatile indicating it is patent .  An ultrasound image was acquired for the  permanent record.  A micropuncture needle was used to access the right common femoral artery under direct ultrasound guidance.  The microwire was then advanced under fluoroscopic guidance without difficulty followed by the micro-sheath.  A 0.035 J wire was advanced without resistance and a 5Fr sheath was placed.    Pigtail catheter was then advanced to the level of T12 and AP projection of the aorta was obtained. Pigtail catheter was then repositioned to above the bifurcation and RAO view of the pelvis was obtained. Stiff angled Glidewire and pigtail catheter was then used across the bifurcation and the catheter was positioned in the distal external iliac artery.  LAO of the left groin was then obtained. Wire was reintroduced and negotiated into the SFA and the catheter was advanced into the SFA. Distal runoff was then performed.  After review of the images the catheter was removed over wire and an RAO view of the groin was obtained. StarClose device was deployed without difficulty.   Findings:   Aortogram: The abdominal aorta is opacified with a bolus injection contrast.  Single renal arteries are noted bilaterally with normal nephrograms.  No evidence of hemodynamically significant renal artery stenosis.  There are no hemodynamically significant stenoses identified within the aorta.  The aortic bifurcation is mildly diseased but widely patent.  Bilateral common internal and external iliac arteries are free of hemodynamically significant lesions.  Left lower Extremity: The left common femoral, profunda femoris superficial femoral and popliteal arteries demonstrate mild atherosclerotic changes but there are no hemodynamically significant lesions.  Trifurcation is patent.  There is 3 vessel runoff to the foot.  The posterior tibial is the largest and dominant runoff to the foot and has extensive branches to the heel area it also fills the pedal arch.  SUMMARY: Based on these images no intervention is  performed at this time.    Disposition: Patient was taken to the recovery room in stable condition having tolerated the procedure well.  Earl Lites Fidelis Loth 06/06/2023,9:25 AM

## 2023-06-06 NOTE — Progress Notes (Signed)
Progress Note   Patient: Carl Yang WUJ:[id number] DOB: 1941/02/06 DOA: 06/04/2023     2 DOS: the patient was seen and examined on 06/06/2023   Brief hospital course: Carl Yang is a 82 y.o. male with medical history significant for a.fib on Eliquis , cad, GERD, DM II, coming with left foot ulcer.  It has progressively gotten worse with drainage and foul odor since 10 days. No fever, pain. Pt is on insulin pump. Blood work shows mild hyponatremia of 132. Mild AKI with cr of 1.66 ands initial lactic of 2.4 which has resolved to 1.6.  Podiatry and vascular team consulted.  He is admitted to hospitalist service for further management evaluation. Vascular team performed aortogram, left lower extremity showed mild atherosclerosis but no occlusive lesions.  Assessment and Plan: * Ulcer of left heel (HCC) Diabetic foot infection Aortogram showed no hemodynamic significant lesions. Continue vancomycin and cefepime - pharmacy consult.   Podiatry team planned for debridement Friday, will follow recommendations. He does not have left heel pain or discomfort.  Atrial fibrillation (HCC) Continue telemetry monitoring. Will resume Eliquis after I&D.  Type 1 diabetes mellitus (HCC) Insulin pump is off. Blood sugars high in 400s. Increased Lantus to 50 units. Continue accucheks, sliding scale insulin. We will make him npo after midnight for I&D intervention tomorrow.   History of coronary artery stent placement Stable no c/o chest pain.  Cont statin.  Held Eliquis for tomorrow I&D  CKD (chronic kidney disease) stage 3, GFR 30-59 ml/min (HCC) Mild worsening.  We will avoid contrast.  Renally dose meds.  Monitor daily renal function.  Anemia of chronic disease Hemoglobin stable. No active bleeding.  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  GERD without esophagitis Continue IV PPI.   OSA on CPAP Home CPAP  ordered.   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 Ramipril held for abnormal kidney function.          Subjective: Patient is seen and examined today after vascular procedure.  He is sitting comfortably, no apparent distress, denies any pain.  Eating fair.  Blood sugars in 400, discussed about increasing Lantus dose. States usually sugars run around 150 at home with use of insulin pump.  Physical Exam: Vitals:   06/06/23 1000 06/06/23 1015 06/06/23 1042 06/06/23 1704  BP: (!) 161/86 (!) 163/74 135/84 (!) 160/82  Pulse: 73 (!) 56 (!) 57 92  Resp: 18 15 16 20   Temp:   97.7 F (36.5 C) 98.5 F (36.9 C)  TempSrc:    Oral  SpO2: 95% 94% 100% (!) 84%  Weight:      Height:       General - Elderly Caucasian male, no apparent distress HEENT - PERRLA, EOMI, atraumatic head, non tender sinuses. Lung - Clear, rales, rhonchi, wheezes. Heart - S1, S2 heard, no murmurs, rubs, trace pedal edema Neuro - Alert, awake and oriented x 3, non focal exam. Skin - Warm and dry.  Left heel ulcer with dressing noted Data Reviewed:     Latest Ref Rng & Units 06/05/2023    6:41 AM 06/04/2023  3:34 PM 07/07/2020    8:46 AM  CBC  WBC 4.0 - 10.5 K/uL 7.6  10.2  5.6   Hemoglobin 13.0 - 17.0 g/dL 91.4  78.2  95.6   Hematocrit 39.0 - 52.0 % 34.9  38.7  35.1   Platelets 150 - 400 K/uL 175  PLATELET CLUMPS NOTED ON SMEAR, UNABLE TO ESTIMATE  193       Latest Ref Rng & Units 06/06/2023    3:36 AM 06/05/2023    6:41 AM 06/04/2023    3:34 PM  BMP  Glucose 70 - 99 mg/dL  213  086   BUN 8 - 23 mg/dL  25  24   Creatinine 5.78 - 1.24 mg/dL 4.69  6.29  5.28   Sodium 135 - 145 mmol/L  133  132    Potassium 3.5 - 5.1 mmol/L  5.0  5.1   Chloride 98 - 111 mmol/L  102  99   CO2 22 - 32 mmol/L  24  20   Calcium 8.9 - 10.3 mg/dL  8.4  8.4    PERIPHERAL VASCULAR CATHETERIZATION  Result Date: 06/06/2023 See surgical note for result.  MR ANKLE LEFT WO CONTRAST  Result Date: 06/05/2023 CLINICAL DATA:  Diabetic left heel wound. EXAM: MRI OF THE LEFT ANKLE WITHOUT CONTRAST TECHNIQUE: Multiplanar, multisequence MR imaging of the ankle was performed. No intravenous contrast was administered. COMPARISON:  Left foot x-rays from same day. FINDINGS: TENDONS Peroneal: Peroneal longus tendon intact. Peroneal brevis intact. Posteromedial: Posterior tibial tendon intact. Flexor digitorum longus tendon intact. Flexor hallucis longus tendon intact. Anterior: Tibialis anterior tendon intact. Extensor hallucis longus tendon intact. Extensor digitorum longus tendon intact. Achilles:  Intact. Mild distal tendinosis. Plantar Fascia: Intact. Thickened proximal central band. LIGAMENTS Lateral: Anterior talofibular ligament intact. Calcaneofibular ligament intact. Posterior talofibular ligament intact. Anterior and posterior tibiofibular ligaments intact. Medial: Deltoid ligament intact. Spring ligament intact. CARTILAGE Ankle Joint: No joint effusion. Normal ankle mortise. Mild degenerative changes. Tiny focus of marrow edema in the lateral talar dome with overlying cartilage fissuring. Subtalar Joints/Sinus Tarsi: Mild degenerative changes of the subtalar joints. No subtalar joint effusion. Normal sinus tarsi. Bones: Abnormal marrow edema with corresponding decreased T1 marrow signal involving plantar aspect of the calcaneal tuberosity. Remaining marrow signal is normal. 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 No fracture or dislocation. Soft Tissue: Superficial ulceration along the plantar aspect of the heel. No fluid collection. Diffuse soft tissue swelling. IMPRESSION: 1. Superficial ulceration  along the plantar aspect of the heel with underlying osteomyelitis of the plantar calcaneal tuberosity. No abscess. Electronically Signed   By: Obie Dredge M.D.   On: 06/05/2023 07:57   DG Foot Complete Left  Result Date: 06/04/2023 CLINICAL DATA:  Cellulitis.  Draining wound. EXAM: LEFT FOOT - COMPLETE 3 VIEW COMPARISON:  None Available. 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 Underlying osteopenia. Degenerative changes of the midfoot. Plantar calcaneal spur. Vascular calcifications. Soft tissue swelling about the foot greatest distally. There is also some loss of roundness to the head of second metatarsal. Please correlate for any history such as AVN. No definite erosive changes identified at this time. Please correlate for exact location of open wound. There is some lucency in the soft tissues plantar to the calcaneus posteriorly. Please correlate for soft tissue gas. If there is further concern of bone infection, recommend follow up MRI or bone scan for higher sensitivity. IMPRESSION: No  definite erosive changes at this time suggest acute bone infection. Osteopenia with soft tissue swelling and degenerative changes. Previous multifocal amputation. Focal soft tissue gas along the plantar aspect of the foot the level of the calcaneus. Electronically Signed   By: Karen Kays M.D.   On: 06/04/2023 18:23     Family Communication: Patient lives alone, understands and agrees with current plan.  Disposition: Status is: Inpatient Remains inpatient appropriate because: Wound infection, IV antibiotics, podiatry I&D procedure, sugars high  Planned Discharge Destination: Home with Home Health    Time spent: 43 minutes  Author: Marcelino Duster, MD 06/06/2023 5:20 PM  For on call review [email or web address].

## 2023-06-06 NOTE — Progress Notes (Signed)
Patient received from specials s/p angiography with dressing to r groin, CDI. No bruising or swelling noted to site. Patient resting quietly, arousable to verbal stimuli. Bradycardic, WDL for this patient. Room Air.   Carl Yang

## 2023-06-06 NOTE — Interval H&P Note (Signed)
History and Physical Interval Note:  06/06/2023 7:54 AM  [name]  has presented today for surgery, with the diagnosis of PAD.  The various methods of treatment have been discussed with the patient and family. After consideration of risks, benefits and other options for treatment, the patient has consented to  Procedure(s): Lower Extremity Angiography (Left) as a surgical intervention.  The patient's history has been reviewed, patient examined, no change in status, stable for surgery.  I have reviewed the patient's chart and labs.  Questions were answered to the patient's satisfaction.     Carl Yang

## 2023-06-07 ENCOUNTER — Inpatient Hospital Stay: Payer: Medicare Other

## 2023-06-07 ENCOUNTER — Encounter: Payer: Self-pay | Admitting: Vascular Surgery

## 2023-06-07 ENCOUNTER — Inpatient Hospital Stay: Payer: Medicare Other | Admitting: Anesthesiology

## 2023-06-07 ENCOUNTER — Encounter
Admission: EM | Disposition: A | Discharge status: Skilled Nursing Facility | Payer: Self-pay | Source: Ambulatory Visit | Attending: Internal Medicine

## 2023-06-07 DIAGNOSIS — D638 Anemia in other chronic diseases classified elsewhere: Secondary | ICD-10-CM | POA: Diagnosis not present

## 2023-06-07 DIAGNOSIS — I825Y1 Chronic embolism and thrombosis of unspecified deep veins of right proximal lower extremity: Secondary | ICD-10-CM | POA: Diagnosis not present

## 2023-06-07 DIAGNOSIS — L97424 Non-pressure chronic ulcer of left heel and midfoot with necrosis of bone: Secondary | ICD-10-CM | POA: Diagnosis not present

## 2023-06-07 DIAGNOSIS — I480 Paroxysmal atrial fibrillation: Secondary | ICD-10-CM | POA: Diagnosis not present

## 2023-06-07 HISTORY — PX: INCISION AND DRAINAGE OF WOUND: SHX1803

## 2023-06-07 LAB — CBC
HCT: 33.8 % — ABNORMAL LOW (ref 39.0–52.0)
Hemoglobin: 11.4 g/dL — ABNORMAL LOW (ref 13.0–17.0)
MCH: 31.1 pg (ref 26.0–34.0)
MCHC: 33.7 g/dL (ref 30.0–36.0)
MCV: 92.1 fL (ref 80.0–100.0)
Platelets: 196 10*3/uL (ref 150–400)
RBC: 3.67 MIL/uL — ABNORMAL LOW (ref 4.22–5.81)
RDW: 13.9 % (ref 11.5–15.5)
WBC: 7.8 10*3/uL (ref 4.0–10.5)
nRBC: 0 % (ref 0.0–0.2)

## 2023-06-07 LAB — COMPREHENSIVE METABOLIC PANEL
ALT: 22 U/L (ref 0–44)
AST: 22 U/L (ref 15–41)
Albumin: 2.5 g/dL — ABNORMAL LOW (ref 3.5–5.0)
Alkaline Phosphatase: 79 U/L (ref 38–126)
Anion gap: 8 (ref 5–15)
BUN: 29 mg/dL — ABNORMAL HIGH (ref 8–23)
CO2: 23 mmol/L (ref 22–32)
Calcium: 8.2 mg/dL — ABNORMAL LOW (ref 8.9–10.3)
Chloride: 104 mmol/L (ref 98–111)
Creatinine, Ser: 1.5 mg/dL — ABNORMAL HIGH (ref 0.61–1.24)
GFR, Estimated: 47 mL/min — ABNORMAL LOW (ref >60)
Glucose, Bld: 238 mg/dL — ABNORMAL HIGH (ref 70–99)
Potassium: 4.4 mmol/L (ref 3.5–5.1)
Sodium: 135 mmol/L (ref 135–145)
Total Bilirubin: 0.7 mg/dL (ref 0.3–1.2)
Total Protein: 5.9 g/dL — ABNORMAL LOW (ref 6.5–8.1)

## 2023-06-07 LAB — GLUCOSE, CAPILLARY
Glucose-Capillary: 214 mg/dL — ABNORMAL HIGH (ref 70–99)
Glucose-Capillary: 214 mg/dL — ABNORMAL HIGH (ref 70–99)
Glucose-Capillary: 232 mg/dL — ABNORMAL HIGH (ref 70–99)
Glucose-Capillary: 246 mg/dL — ABNORMAL HIGH (ref 70–99)
Glucose-Capillary: 261 mg/dL — ABNORMAL HIGH (ref 70–99)
Glucose-Capillary: 297 mg/dL — ABNORMAL HIGH (ref 70–99)
Glucose-Capillary: 381 mg/dL — ABNORMAL HIGH (ref 70–99)
Glucose-Capillary: 396 mg/dL — ABNORMAL HIGH (ref 70–99)

## 2023-06-07 SURGERY — IRRIGATION AND DEBRIDEMENT WOUND
Anesthesia: General | Laterality: Left

## 2023-06-07 MED ORDER — CHLORHEXIDINE GLUCONATE 0.12 % MT SOLN
15.0000 mL | Freq: Once | OROMUCOSAL | Status: AC
  Administered 2023-06-07: 15 mL via OROMUCOSAL

## 2023-06-07 MED ORDER — FENTANYL CITRATE (PF) 100 MCG/2ML IJ SOLN: 25.0000 ug | INTRAMUSCULAR | Status: DC | PRN

## 2023-06-07 MED ORDER — ACETAMINOPHEN 10 MG/ML IV SOLN
INTRAVENOUS | Status: DC | PRN
  Administered 2023-06-07: 1000 mg via INTRAVENOUS

## 2023-06-07 MED ORDER — LIDOCAINE HCL (CARDIAC) PF 100 MG/5ML IV SOSY
PREFILLED_SYRINGE | INTRAVENOUS | Status: DC | PRN
  Administered 2023-06-07: 100 mg via INTRAVENOUS

## 2023-06-07 MED ORDER — EPHEDRINE 5 MG/ML INJ
INTRAVENOUS | Status: AC
  Filled 2023-06-07: qty 5

## 2023-06-07 MED ORDER — DEXMEDETOMIDINE HCL IN NACL 200 MCG/50ML IV SOLN
INTRAVENOUS | Status: DC | PRN
Start: 2023-06-07 — End: 2023-06-07
  Administered 2023-06-07 (×2): 4 ug via INTRAVENOUS

## 2023-06-07 MED ORDER — PROPOFOL 1000 MG/100ML IV EMUL
INTRAVENOUS | Status: AC
  Filled 2023-06-07: qty 100

## 2023-06-07 MED ORDER — FENTANYL CITRATE (PF) 100 MCG/2ML IJ SOLN
INTRAMUSCULAR | Status: DC | PRN
  Administered 2023-06-07 (×2): 25 ug via INTRAVENOUS

## 2023-06-07 MED ORDER — DOCUSATE SODIUM 100 MG PO CAPS
100.0000 mg | ORAL_CAPSULE | Freq: Two times a day (BID) | ORAL | Status: DC
  Administered 2023-06-07 – 2023-06-12 (×9): 100 mg via ORAL
  Filled 2023-06-07 (×10): qty 1

## 2023-06-07 MED ORDER — PROPOFOL 10 MG/ML IV BOLUS
INTRAVENOUS | Status: DC | PRN
  Administered 2023-06-07: 150 mg via INTRAVENOUS

## 2023-06-07 MED ORDER — HYDROCODONE-ACETAMINOPHEN 5-325 MG PO TABS: 1.0000 | ORAL_TABLET | ORAL | Status: DC | PRN

## 2023-06-07 MED ORDER — INSULIN ASPART 100 UNIT/ML IJ SOLN
10.0000 [IU] | Freq: Three times a day (TID) | INTRAMUSCULAR | Status: DC
  Administered 2023-06-07 – 2023-06-09 (×6): 10 [IU] via SUBCUTANEOUS
  Filled 2023-06-07 (×5): qty 1

## 2023-06-07 MED ORDER — SODIUM CHLORIDE 0.9 % IV SOLN: INTRAVENOUS | Status: DC | PRN

## 2023-06-07 MED ORDER — SODIUM CHLORIDE 0.9 % IV SOLN: INTRAVENOUS | Status: DC

## 2023-06-07 MED ORDER — GLYCOPYRROLATE 0.2 MG/ML IJ SOLN
INTRAMUSCULAR | Status: DC | PRN
  Administered 2023-06-07: .2 mg via INTRAVENOUS

## 2023-06-07 MED ORDER — OXYCODONE HCL 5 MG/5ML PO SOLN: 5.0000 mg | Freq: Once | ORAL | Status: DC | PRN

## 2023-06-07 MED ORDER — SENNA 8.6 MG PO TABS
2.0000 | ORAL_TABLET | Freq: Every day | ORAL | Status: DC
  Administered 2023-06-07 – 2023-06-11 (×4): 17.2 mg via ORAL
  Filled 2023-06-07 (×5): qty 2

## 2023-06-07 MED ORDER — ACETAMINOPHEN 10 MG/ML IV SOLN
INTRAVENOUS | Status: AC
  Filled 2023-06-07: qty 100

## 2023-06-07 MED ORDER — EPHEDRINE SULFATE-NACL 50-0.9 MG/10ML-% IV SOSY
PREFILLED_SYRINGE | INTRAVENOUS | Status: DC | PRN
Start: 2023-06-07 — End: 2023-06-07
  Administered 2023-06-07: 10 mg via INTRAVENOUS

## 2023-06-07 MED ORDER — POLYETHYLENE GLYCOL 3350 17 G PO PACK
17.0000 g | PACK | Freq: Every day | ORAL | Status: DC
  Administered 2023-06-08 – 2023-06-12 (×4): 17 g via ORAL
  Filled 2023-06-07 (×5): qty 1

## 2023-06-07 MED ORDER — LIDOCAINE HCL (PF) 2 % IJ SOLN
INTRAMUSCULAR | Status: AC
  Filled 2023-06-07: qty 5

## 2023-06-07 MED ORDER — ONDANSETRON HCL 4 MG/2ML IJ SOLN
INTRAMUSCULAR | Status: AC
  Filled 2023-06-07: qty 2

## 2023-06-07 MED ORDER — CHLORHEXIDINE GLUCONATE 0.12 % MT SOLN
OROMUCOSAL | Status: AC
  Filled 2023-06-07: qty 15

## 2023-06-07 MED ORDER — ORAL CARE MOUTH RINSE: 15.0000 mL | Freq: Once | OROMUCOSAL | Status: AC

## 2023-06-07 MED ORDER — FENTANYL CITRATE (PF) 100 MCG/2ML IJ SOLN
INTRAMUSCULAR | Status: AC
  Filled 2023-06-07: qty 2

## 2023-06-07 MED ORDER — DEXMEDETOMIDINE HCL IN NACL 80 MCG/20ML IV SOLN
INTRAVENOUS | Status: AC
  Filled 2023-06-07: qty 20

## 2023-06-07 MED ORDER — 0.9 % SODIUM CHLORIDE (POUR BTL) OPTIME
TOPICAL | Status: DC | PRN
  Administered 2023-06-07: 3000 mL
  Administered 2023-06-07: 1000 mL

## 2023-06-07 MED ORDER — ONDANSETRON HCL 4 MG/2ML IJ SOLN
INTRAMUSCULAR | Status: DC | PRN
  Administered 2023-06-07: 4 mg via INTRAVENOUS

## 2023-06-07 MED ORDER — OXYCODONE HCL 5 MG PO TABS: 5.0000 mg | ORAL_TABLET | Freq: Once | ORAL | Status: DC | PRN

## 2023-06-07 SURGICAL SUPPLY — 57 items
BLADE MED AGGRESSIVE (BLADE) IMPLANT
BLADE OSCILLATING/SAGITTAL (BLADE) ×1
BLADE SURG 15 STRL LF DISP TIS (BLADE) ×1 IMPLANT
BLADE SURG 15 STRL SS (BLADE) ×1
BLADE SURG MINI STRL (BLADE) IMPLANT
BLADE SW THK.38XMED LNG THN (BLADE) IMPLANT
BNDG CMPR 75X21 PLY HI ABS (MISCELLANEOUS) ×1
BNDG CMPR STD VLCR NS LF 5.8X4 (GAUZE/BANDAGES/DRESSINGS) ×1
BNDG ELASTIC 4X5.8 VLCR NS LF (GAUZE/BANDAGES/DRESSINGS) ×1 IMPLANT
BNDG ESMARCH 4 X 12 STRL LF (GAUZE/BANDAGES/DRESSINGS) ×1
BNDG ESMARCH 4X12 STRL LF (GAUZE/BANDAGES/DRESSINGS) ×1 IMPLANT
BNDG GAUZE DERMACEA FLUFF 4 (GAUZE/BANDAGES/DRESSINGS) ×1 IMPLANT
BNDG GZE DERMACEA 4 6PLY (GAUZE/BANDAGES/DRESSINGS) ×1
CUFF TOURN SGL QUICK 12 (TOURNIQUET CUFF) IMPLANT
CUFF TOURN SGL QUICK 18X4 (TOURNIQUET CUFF) IMPLANT
DRAPE FLUOR MINI C-ARM 54X84 (DRAPES) IMPLANT
DRSG MEPITEL 4X7.2 (GAUZE/BANDAGES/DRESSINGS) IMPLANT
DURAPREP 26ML APPLICATOR (WOUND CARE) ×1 IMPLANT
ELECT REM PT RETURN 9FT ADLT (ELECTROSURGICAL) ×1 IMPLANT
ELECTRODE REM PT RTRN 9FT ADLT (ELECTROSURGICAL) ×1 IMPLANT
GAUZE SPONGE 4X4 12PLY STRL (GAUZE/BANDAGES/DRESSINGS) ×1 IMPLANT
GAUZE STRETCH 2X75IN STRL (MISCELLANEOUS) ×1 IMPLANT
GAUZE XEROFORM 1X8 LF (GAUZE/BANDAGES/DRESSINGS) ×1 IMPLANT
GLOVE BIO SURGEON STRL SZ7.5 (GLOVE) ×1 IMPLANT
GLOVE INDICATOR 8.0 STRL GRN (GLOVE) ×1 IMPLANT
GOWN STRL REUS W/ TWL LRG LVL3 (GOWN DISPOSABLE) ×2 IMPLANT
GOWN STRL REUS W/TWL LRG LVL3 (GOWN DISPOSABLE) ×2
HANDPIECE VERSAJET DEBRIDEMENT (MISCELLANEOUS) ×1 IMPLANT
KIT STIMULAN RAPID CURE 5CC (Orthopedic Implant) IMPLANT
KIT TURNOVER KIT A (KITS) ×1 IMPLANT
LABEL OR SOLS (LABEL) ×1 IMPLANT
MANIFOLD NEPTUNE II (INSTRUMENTS) ×1 IMPLANT
MEPITEL IMPLANT
NDL FILTER BLUNT 18X1 1/2 (NEEDLE) ×1 IMPLANT
NDL HYPO 25X1 1.5 SAFETY (NEEDLE) ×3 IMPLANT
NEEDLE FILTER BLUNT 18X1 1/2 (NEEDLE) ×1 IMPLANT
NEEDLE HYPO 25X1 1.5 SAFETY (NEEDLE) ×3 IMPLANT
NS IRRIG 500ML POUR BTL (IV SOLUTION) ×1 IMPLANT
PACK EXTREMITY ARMC (MISCELLANEOUS) ×1 IMPLANT
PAD ABD DERMACEA PRESS 5X9 (GAUZE/BANDAGES/DRESSINGS) ×2 IMPLANT
RASP SM TEAR CROSS CUT (RASP) IMPLANT
SOL PREP PVP 2OZ (MISCELLANEOUS) ×1 IMPLANT
SOLUTION PREP PVP 2OZ (MISCELLANEOUS) ×1 IMPLANT
STAPLER SKIN PROX 35W (STAPLE) IMPLANT
STOCKINETTE STRL 6IN 960660 (GAUZE/BANDAGES/DRESSINGS) ×1 IMPLANT
SUT ETHILON 3-0 FS-10 30 BLK (SUTURE) ×1 IMPLANT
SUT ETHILON 4-0 (SUTURE) ×1
SUT ETHILON 4-0 FS2 18XMFL BLK (SUTURE) ×1 IMPLANT
SUT VIC AB 3-0 SH 27 (SUTURE) ×1
SUT VIC AB 3-0 SH 27X BRD (SUTURE) ×1 IMPLANT
SUT VIC AB 4-0 FS2 27 (SUTURE) ×1 IMPLANT
SUTURE EHLN 3-0 FS-10 30 BLK (SUTURE) ×1 IMPLANT
SUTURE ETHLN 4-0 FS2 18XMF BLK (SUTURE) ×1 IMPLANT
SYR 10ML LL (SYRINGE) ×2 IMPLANT
SYR 3ML LL SCALE MARK (SYRINGE) ×1 IMPLANT
TRAP FLUID SMOKE EVACUATOR (MISCELLANEOUS) ×1 IMPLANT
WATER STERILE IRR 500ML POUR (IV SOLUTION) ×1 IMPLANT

## 2023-06-07 NOTE — Consult Note (Signed)
Pharmacy Antibiotic Note  Carl Yang is a 82 y.o. male admitted on 06/04/2023 with  foot wound . PMH significant for arthritis, PVD, T1DM, OSA on CPAP, anemia, CKD3, CAD, AF (on Eliquis), HLD. He has recently been on doxycycline x10 days for this infection. He was sent from the office to the emergency department for admission and IV antibiotics. Pharmacy has been consulted for vancomycin, cefepime dosing.  Plan: Day 3 of antibiotics 1) continue vancomycin 1500 mg IV every 24 hours Expected AUC: 494.5 Expected Css min: 12.4 SCr used: 1.50 (~baseline) Weight used: IBW, Vd used: 0.72 (BMI 26.2) 2) continue cefepime 2 g IV Q12H Continue to monitor renal function and follow culture results   Height: 6\' 3"  (190.5 cm) Weight: 95.3 kg (210 lb 1.6 oz) IBW/kg (Calculated) : 84.5  Temp (24hrs), Avg:98.1 F (36.7 C), Min:97.7 F (36.5 C), Max:98.5 F (36.9 C)  Recent Labs  Lab 06/04/23 1534 06/04/23 1711 06/05/23 0641 06/06/23 0336 06/07/23 0636  WBC 10.2  --  7.6  --  7.8  CREATININE 1.66*  --  1.47* 1.46* 1.50*  LATICACIDVEN 2.4* 1.6  --   --   --     Estimated Creatinine Clearance: 46.9 mL/min (A) (by C-G formula based on SCr of 1.5 mg/dL (H)).    No Known Allergies  Antimicrobials this admission: 9/17 vancomycin >>  9/17 cefepime >>   Microbiology results: 9/17 BCx: NGTD 9/17 Wound Cx: P aeruginosa, K pneumoniae (IP)  Thank you for allowing pharmacy to be a part of this patient's care.  Lowella Bandy, PharmD Clinical Pharmacist 06/07/2023 8:42 AM

## 2023-06-07 NOTE — Plan of Care (Signed)
  Problem: Metabolic: Goal: Ability to maintain appropriate glucose levels will improve Outcome: Progressing   Problem: Nutritional: Goal: Maintenance of adequate nutrition will improve Outcome: Progressing   Problem: Nutritional: Goal: Progress toward achieving an optimal weight will improve Outcome: Progressing   Problem: Skin Integrity: Goal: Risk for impaired skin integrity will decrease Outcome: Progressing   Problem: Tissue Perfusion: Goal: Adequacy of tissue perfusion will improve Outcome: Progressing   Problem: Clinical Measurements: Goal: Will remain free from infection Outcome: Progressing   Problem: Nutrition: Goal: Adequate nutrition will be maintained Outcome: Progressing   Problem: Skin Integrity: Goal: Risk for impaired skin integrity will decrease Outcome: Progressing   Problem: Education: Goal: Understanding of CV disease, CV risk reduction, and recovery process will improve Outcome: Progressing

## 2023-06-07 NOTE — Transfer of Care (Signed)
Immediate Anesthesia Transfer of Care Note  Patient: Carl Yang  Procedure(s) Performed: IRRIGATION AND DEBRIDEMENT BONE OF LEFT HEEL (Left)  Patient Location: PACU  Anesthesia Type:General  Level of Consciousness: drowsy  Airway & Oxygen Therapy: Patient Spontanous Breathing and Patient connected to face mask oxygen  Post-op Assessment: Report given to RN and Post -op Vital signs reviewed and stable  Post vital signs: Reviewed and stable  Last Vitals:  Vitals Value Taken Time  BP 121/56 06/07/23 1315  Temp 36.4 C 06/07/23 1315  Pulse 61 06/07/23 1322  Resp 14 06/07/23 1322  SpO2 100 % 06/07/23 1322  Vitals shown include unfiled device data.  Last Pain:  Vitals:   06/07/23 1315  TempSrc:   PainSc: Asleep         Complications: No notable events documented.

## 2023-06-07 NOTE — Progress Notes (Signed)
Patient received from pacu in stable condition. Verbal, dressing to left foot, cdi. Family at bedside.  Cornell Barman Tennyson Wacha

## 2023-06-07 NOTE — Progress Notes (Signed)
Progress Note   Patient: Carl Yang VPX:[id number] DOB: 12/17/1940 DOA: 06/04/2023     3 DOS: the patient was seen and examined on 06/07/2023   Brief hospital course: Carl Yang is a 82 y.o. male with medical history significant for a.fib on Eliquis , cad, GERD, DM II, coming with left foot ulcer.  It has progressively gotten worse with drainage and foul odor since 10 days. No fever, pain. Pt is on insulin pump. Blood work shows mild hyponatremia of 132. Mild AKI with cr of 1.66 ands initial lactic of 2.4 which has resolved to 1.6.  Podiatry and vascular team consulted.  He is admitted to hospitalist service for further management evaluation. Vascular team performed aortogram, left lower extremity showed mild atherosclerosis but no occlusive lesions. Podiarty team performed I&D. He tolerated the procedure well.  Assessment and Plan: * Ulcer of left heel (HCC) Diabetic foot infection Aortogram showed no hemodynamic significant lesions. Continue vancomycin and cefepime - pharmacy consult.   Podiatry performed debridement 06/07/23, will follow recommendations. He does not have left heel pain or discomfort. Continue dressing changes. NWB left lower extremity. PT eval, he may need SNF placement.  Atrial fibrillation (HCC) Continue telemetry monitoring. Will resume Eliquis after I&D.  Type 1 diabetes mellitus (HCC) Insulin pump is off. Blood sugars high in 200s. Lantus 50 units, Continue accucheks, sliding scale insulin. Carb consistent diet.  History of coronary artery stent placement Stable no c/o chest pain.  Cont statin.  Held Eliquis for tomorrow I&D  CKD (chronic kidney disease) stage 3, GFR 30-59 ml/min (HCC) Mild worsening.  We will avoid contrast.  Renally dose meds.  Monitor daily renal function.  Anemia of chronic disease Hemoglobin stable. No active bleeding.  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  GERD without esophagitis Continue IV PPI.   OSA on CPAP Home CPAP ordered.   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 Ramipril held for abnormal kidney function.          Subjective: Patient is seen and examined today after podiatry I&d procedure. He denies pain or discomfort. Family at bedside. He is eating fair. Sugars improved.  Physical Exam: Vitals:   06/07/23 1400 06/07/23 1422 06/07/23 1456 06/07/23 1606  BP: (!) 147/75 (!) 140/73 138/72 130/63  Pulse: (!) 55 (!) 50 (!) 46 (!) 49  Resp: 19 18 18 20   Temp: (!) 97.1 F (36.2 C) (!) 97.5 F (36.4 C) 97.7 F (36.5 C) (!) 97.4 F (36.3 C)  TempSrc:      SpO2: 98% 97% 95% 100%  Weight:      Height:       General - Elderly Caucasian male, no apparent distress HEENT - PERRLA, EOMI, atraumatic head, non tender sinuses. Lung - Clear, rales, rhonchi, wheezes. Heart - S1, S2 heard, no murmurs, rubs, trace pedal edema Neuro - Alert, awake and oriented x 3, non focal exam. Skin - Warm and dry.  Left heel ulcer with dressing noted Data Reviewed:     Latest Ref Rng & Units 06/07/2023    6:36 AM 06/05/2023    6:41 AM  06/04/2023    3:34 PM  CBC  WBC 4.0 - 10.5 K/uL 7.8  7.6  10.2   Hemoglobin 13.0 - 17.0 g/dL 11.9  14.7  82.9   Hematocrit 39.0 - 52.0 % 33.8  34.9  38.7   Platelets 150 - 400 K/uL 196  175  PLATELET CLUMPS NOTED ON SMEAR, UNABLE TO ESTIMATE       Latest Ref Rng & Units 06/07/2023    6:36 AM 06/06/2023    8:31 PM 06/06/2023    3:36 AM  BMP  Glucose 70 - 99 mg/dL 562  130    BUN 8 - 23 mg/dL 29     Creatinine 8.65 - 1.24 mg/dL 7.84   6.96   Sodium 295 - 145 mmol/L 135     Potassium  3.5 - 5.1 mmol/L 4.4     Chloride 98 - 111 mmol/L 104     CO2 22 - 32 mmol/L 23     Calcium 8.9 - 10.3 mg/dL 8.2      DG MINI C-ARM IMAGE ONLY  Result Date: 06/07/2023 There is no interpretation for this exam.  This order is for images obtained during a surgical procedure.  Please See "Surgeries" Tab for more information regarding the procedure.   PERIPHERAL VASCULAR CATHETERIZATION  Result Date: 06/06/2023 See surgical note for result.    Family Communication: Patient lives alone, understands and agrees with current plan.  Disposition: Status is: Inpatient Remains inpatient appropriate because: post I&D, IV antibiotics  Planned Discharge Destination: Home with Home Health    Time spent: 42 minutes  Author: Marcelino Duster, MD 06/07/2023 4:17 PM  For on call review [email or web address].

## 2023-06-07 NOTE — Anesthesia Preprocedure Evaluation (Signed)
Anesthesia Evaluation  Patient identified by MRN, date of birth, ID band Patient awake    Reviewed: Allergy & Precautions, NPO status , Patient's Chart, lab work & pertinent test results  History of Anesthesia Complications Negative for: history of anesthetic complications  Airway Mallampati: III  TM Distance: >3 FB Neck ROM: full    Dental  (+) Chipped   Pulmonary sleep apnea and Continuous Positive Airway Pressure Ventilation    Pulmonary exam normal        Cardiovascular hypertension, On Medications + CAD, + Past MI, + Cardiac Stents and + Peripheral Vascular Disease  + dysrhythmias Atrial Fibrillation      Neuro/Psych  Neuromuscular disease  negative psych ROS   GI/Hepatic Neg liver ROS,GERD  Medicated,,  Endo/Other  negative endocrine ROSdiabetes, Poorly Controlled, Insulin Dependent    Renal/GU CRF and ARFRenal disease  negative genitourinary   Musculoskeletal   Abdominal   Peds  Hematology  (+) Blood dyscrasia, anemia Chronic DVT    Anesthesia Other Findings Past Medical History: No date: Arthritis No date: Atrial fibrillation (HCC) No date: Cancer (HCC)     Comment:  Melanoma No date: Chronic anticoagulation     Comment:  Warfarin No date: Complication of anesthesia     Comment:  After surgery at [facility], the patient coded during the               night. No date: Coronary artery disease     Comment:  Stents No date: Diabetes mellitus without complication (HCC) No date: DVT (deep venous thrombosis) (HCC)     Comment:  Right lower limb No date: Dyspnea No date: GERD (gastroesophageal reflux disease) No date: Insulin pump in place No date: Myocardial infarction [facility]     Comment:  1998, 2003 No date: Neuromuscular disorder (HCC)     Comment:  Peripheral Neuropathy r/t DM No date: Peripheral vascular disease (HCC)     Comment:  Diabetes No date: Sleep apnea     Comment:  Uses CPAP  Past Surgical  History: No date: AMPUTATION TOE; Left     Comment:  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 10/09/2018: AMPUTATION TOE; Left     Comment:  Procedure: TOE IPJ 2ND TOE LEFT;  Surgeon: Recardo Evangelist, DPM;  Location: [facility];  Service:               Podiatry;  Laterality: Left;  IVA LOCAL Diabeticv -               insulin pump No date: BACK SURGERY     Comment:  L4 & L5 Fusion 07/08/2020: BONE EXCISION; Left     Comment:  Procedure: EXCISION BONE METATARSAL LEFT;  Surgeon:               Gwyneth Revels, DPM;  Location: [facility];  Service:               Podiatry;  Laterality: Left; 2003: CARDIAC CATHETERIZATION No date: CORONARY ANGIOPLASTY WITH STENT PLACEMENT     Comment:  stents x 2 No date: DIAGNOSTIC LAPAROSCOPY No date: EYE SURGERY; Bilateral     Comment:  cataracts 02/09/2020: PARTIAL KNEE ARTHROPLASTY; Left     Comment:  Procedure: UNICOMPARTMENTAL KNEE;  Surgeon: Christena Flake, MD;  Location: [facility];  Service: Orthopedics;  Laterality: Left; No date: TONSILLECTOMY 07/15/2018: TOTAL KNEE ARTHROPLASTY; Right     Comment:  Procedure: TOTAL KNEE ARTHROPLASTY;  Surgeon: Christena Flake, MD;  Location: [facility];  Service: Orthopedics;                Laterality: Right; 05/05/2019: TOTAL KNEE REVISION; Right     Comment:  Procedure: TOTAL KNEE REVISION;  Surgeon: Christena Flake,              MD;  Location: [facility];  Service: Orthopedics;                Laterality: Right;  BMI    Body Mass Index: 26.26 kg/m      Reproductive/Obstetrics negative OB ROS                             Anesthesia Physical Anesthesia Plan  ASA: 3  Anesthesia Plan: General   Post-op Pain Management: Minimal or no pain anticipated and Ofirmev IV (intra-op)*   Induction: Intravenous  PONV Risk Score and Plan: 1 and Propofol infusion and TIVA  Airway Management Planned: Natural Airway and Nasal  Cannula  Additional Equipment:   Intra-op Plan:   Post-operative Plan:   Informed Consent: I have reviewed the patients History and Physical, chart, labs and discussed the procedure including the risks, benefits and alternatives for the proposed anesthesia with the patient or authorized representative who has indicated his/her understanding and acceptance.     Dental Advisory Given  Plan Discussed with: Anesthesiologist, CRNA and Surgeon  Anesthesia Plan Comments: (Patient consented for risks of anesthesia including but not limited to:  - adverse reactions to medications - risk of airway placement if required - damage to eyes, teeth, lips or other oral mucosa - nerve damage due to positioning  - sore throat or hoarseness - Damage to heart, brain, nerves, lungs, other parts of body or loss of life  Patient voiced understanding.)       Anesthesia Quick Evaluation

## 2023-06-07 NOTE — Anesthesia Postprocedure Evaluation (Signed)
Anesthesia Post Note  Patient: Carl Yang  Procedure(s) Performed: IRRIGATION AND DEBRIDEMENT BONE OF LEFT HEEL (Left)  Patient location during evaluation: PACU Anesthesia Type: General Level of consciousness: awake and alert Pain management: pain level controlled Vital Signs Assessment: post-procedure vital signs reviewed and stable Respiratory status: spontaneous breathing, nonlabored ventilation, respiratory function stable and patient connected to nasal cannula oxygen Cardiovascular status: blood pressure returned to baseline and stable Postop Assessment: no apparent nausea or vomiting Anesthetic complications: no   No notable events documented.   Last Vitals:  Vitals:   06/07/23 1052 06/07/23 1315  BP:  (!) 121/56  Pulse: (!) 45 61  Resp: 16 13  Temp: (!) 36.3 C (!) 36.4 C  SpO2: 100% 100%    Last Pain:  Vitals:   06/07/23 1315  TempSrc:   PainSc: Asleep                 Louie Boston

## 2023-06-07 NOTE — Op Note (Signed)
Date of operation: 06/07/2023.  Surgeon: Ricci Barker D.P.M.  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  Postoperative diagnosis: Same.  Procedures: 1.  I&D with debridement of bone left heel. 2.  Excisional debridement full-thickness ulceration left heel to the level of bone.  Anesthesia: LMA.  Hemostasis: None.  Estimated blood loss: 75 to 100 cc.  Cultures: Bone culture left calcaneus.  Pathology: Bone left calcaneus.  Implants: Stimulan rapid cure antibiotic beads impregnated with vancomycin.  Complications: None apparent.  Operative indications: This is an 82 year old diabetic male with significant neuropathy with recent history of worsening ulceration on the left heel.  Became necrotic looking with significant malodor and was sent for admission to the hospital.  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  Decision made for debridement of the ulceration with excision of the infected bone.  Operative procedure: Patient was taken to the operating room and placed on the table in the supine position.  Following satisfactory LMA anesthesia a pneumatic tourniquet was applied to the level of the left calf and the foot was prepped and draped in the usual sterile fashion.  The foot was exsanguinated and the tourniquet inflated to 250 mmHg.  Large ulcerative area on the plantar aspect of the left heel measuring approximately 4 cm diameter predebridement was incised using a 15 blade with excisional debridement of the devitalized necrotic tissue.  Significant bleeding was noted.  Tourniquet inflated to 300 mg mercury with continued significant bleeding.  Decision was made to let down the tourniquet and proceed without.  Again excisional debridement performed of the devitalized tissue on the plantar aspect of the left heel sharply using a 15 blade and rongeur down to the level of bone.  The remainder of the wound was then thoroughly debrided and  irrigated with a Versajet debrider on a setting of 4.      Attention was then directed to the lateral aspect of the left heel where an approximate 5 cm linear incision was made coursing from distal to proximal along the lateral aspect of the heel.  Incision carried sharply down to the level of the bone.  Soft tissues dissected off of the calcaneus sharply using a 15 blade as well as key elevator and Beaver blade.  Using a sagittal saw the inferior aspect of the calcaneus was incised from lateral to medial and freed using an osteotome.  The calcaneal fragment was then removed from the wound in toto.  A section taken for bone culture and the rest sent for pathology with the proximal margin inked.  Next a power rasp was used to smooth the inferior aspect of the calcaneus.  The wound was then again thoroughly debrided and irrigated using a Versajet debrider on a setting of 3.  The wound was then flushed with copious amounts of sterile saline using bulb syringe.  Lateral incision was closed using 3-0 nylon with vertical mattress and simple interrupted sutures.  Stimulan rapid cure antibiotic beads then placed into the wound plantarly and the deep portion of the wound was closed using 3-0 nylon figure-of-eight suture leaving a superficial opening that could not completely be closed.  Mepitel was stapled along the inferior aspect of the wound and the remainder of the antibiotic beads placed within the wound and the Mepitel secured using skin staples.  The foot was then washed and Xeroform applied to the lateral incision followed by 4 x 4's ABDs and Kerlix.  Dressings were taken down and second Kerlix and Ace wrap  then applied for compression.  Patient was awakened and transported to the PACU having tolerated the anesthesia and the procedures well.

## 2023-06-07 NOTE — Care Management Important Message (Signed)
Important Message  Patient Details  Name: Carl Yang MRN: [id number] Date of Birth: May 25, 1941   Medicare Important Message Given:  Yes  Obtained initial consent from patient for Medicare IM.  Copy given to patient, original to be scanned into chart.   Johnell Comings 06/07/2023, 10:41 AM

## 2023-06-07 NOTE — Progress Notes (Signed)
  Progress Note    06/07/2023 3:28 PM Day of Surgery  Subjective:  Carl Yang is an 82 yo male who is POD #1 from left lower extremity angiogram. Angiogram was diagnostic. Patient has very normal blood flow to his left lower extremity. Patient denies any pain. No complaints over night and vitals all remain stable.    Vitals:   06/07/23 1422 06/07/23 1456  BP: (!) 140/73 138/72  Pulse: (!) 50 (!) 46  Resp: 18 18  Temp: (!) 97.5 F (36.4 C) 97.7 F (36.5 C)  SpO2: 97% 95%   Physical Exam: Cardiac:  RRR, Normal S1, S2.  Lungs:  Clear throughout on auscultation.  Incisions:  Right groin with dressing clean dry and intact.  Extremities:  Left lower extremity with heel ulceration. Positive doppler pulses. Abdomen:  Positive bowel sounds throughout, soft, non tender and non distended.  Neurologic: AAOX4, answers questions appropriately.   CBC    Component Value Date/Time   WBC 7.8 06/07/2023 0636   RBC 3.67 (L) 06/07/2023 0636   HGB 11.4 (L) 06/07/2023 0636   HCT 33.8 (L) 06/07/2023 0636   PLT 196 06/07/2023 0636   MCV 92.1 06/07/2023 0636   MCH 31.1 06/07/2023 0636   MCHC 33.7 06/07/2023 0636   RDW 13.9 06/07/2023 0636   LYMPHSABS 1.2 06/04/2023 1534   MONOABS 0.7 06/04/2023 1534   EOSABS 0.1 06/04/2023 1534   BASOSABS 0.0 06/04/2023 1534    BMET    Component Value Date/Time   NA 135 06/07/2023 0636   K 4.4 06/07/2023 0636   CL 104 06/07/2023 0636   CO2 23 06/07/2023 0636   GLUCOSE 238 (H) 06/07/2023 0636   BUN 29 (H) 06/07/2023 0636   CREATININE 1.50 (H) 06/07/2023 0636   CALCIUM 8.2 (L) 06/07/2023 0636   GFRNONAA 47 (L) 06/07/2023 0636   GFRAA >60 02/23/2020 0555    INR    Component Value Date/Time   INR 1.3 (H) 06/05/2023 0641     Intake/Output Summary (Last 24 hours) at 06/07/2023 1528 Last data filed at 06/07/2023 1312 Gross per 24 hour  Intake 1500 ml  Output 1225 ml  Net 275 ml     Assessment/Plan:  82 y.o. male is s/p eft lower extremity  angiogram. Angiogram was diagnostic.  Day of Surgery   PLAN: Vascular Surgery will sign off at this time. Angiogram was diagnostic. Podiatry will follow the patient. Surgery planned for later today.    DVT prophylaxis:  Lovenox 40 mg    Marcie Bal [facility] 06/07/2023 3:28 PM

## 2023-06-07 NOTE — Anesthesia Procedure Notes (Signed)
Procedure Name: LMA Insertion Date/Time: 06/07/2023 11:55 AM  Performed by: Stormy Fabian, CRNAPre-anesthesia Checklist: Patient identified, Patient being monitored, Timeout performed, Emergency Drugs available and Suction available Patient Re-evaluated:Patient Re-evaluated prior to induction Oxygen Delivery Method: Circle system utilized Preoxygenation: Pre-oxygenation with 100% oxygen Induction Type: IV induction Ventilation: Mask ventilation without difficulty LMA: LMA inserted LMA Size: 4.0 Tube type: Oral Number of attempts: 1 Placement Confirmation: positive ETCO2 and breath sounds checked- equal and bilateral Tube secured with: Tape Dental Injury: Teeth and Oropharynx as per pre-operative assessment

## 2023-06-07 NOTE — Progress Notes (Signed)
Pt refused CPAP therapy, doesnt want to use hospital equipment.

## 2023-06-07 NOTE — Interval H&P Note (Signed)
History and Physical Interval Note:  06/07/2023 11:07 AM  Carl Yang  has presented today for surgery, with the diagnosis of osteomyelitis.  The various methods of treatment have been discussed with the patient and family. After consideration of risks, benefits and other options for treatment, the patient has consented to  Procedure(s): IRRIGATION AND DEBRIDEMENT BONE OF LEFT HEEL (Left) as a surgical intervention.  The patient's history has been reviewed, patient examined, no change in status, stable for surgery.  I have reviewed the patient's chart and labs.  Questions were answered to the patient's satisfaction.     Ricci Barker

## 2023-06-08 ENCOUNTER — Other Ambulatory Visit: Payer: Self-pay

## 2023-06-08 DIAGNOSIS — D638 Anemia in other chronic diseases classified elsewhere: Secondary | ICD-10-CM | POA: Diagnosis not present

## 2023-06-08 DIAGNOSIS — I825Y1 Chronic embolism and thrombosis of unspecified deep veins of right proximal lower extremity: Secondary | ICD-10-CM | POA: Diagnosis not present

## 2023-06-08 DIAGNOSIS — I480 Paroxysmal atrial fibrillation: Secondary | ICD-10-CM | POA: Diagnosis not present

## 2023-06-08 DIAGNOSIS — L97424 Non-pressure chronic ulcer of left heel and midfoot with necrosis of bone: Secondary | ICD-10-CM | POA: Diagnosis not present

## 2023-06-08 LAB — GLUCOSE, CAPILLARY
Glucose-Capillary: 200 mg/dL — ABNORMAL HIGH (ref 70–99)
Glucose-Capillary: 230 mg/dL — ABNORMAL HIGH (ref 70–99)
Glucose-Capillary: 237 mg/dL — ABNORMAL HIGH (ref 70–99)
Glucose-Capillary: 308 mg/dL — ABNORMAL HIGH (ref 70–99)
Glucose-Capillary: 353 mg/dL — ABNORMAL HIGH (ref 70–99)
Glucose-Capillary: 366 mg/dL — ABNORMAL HIGH (ref 70–99)

## 2023-06-08 LAB — CREATININE, SERUM
Creatinine, Ser: 1.32 mg/dL — ABNORMAL HIGH (ref 0.61–1.24)
GFR, Estimated: 55 mL/min — ABNORMAL LOW (ref >60)

## 2023-06-08 MED ORDER — APIXABAN 2.5 MG PO TABS
2.5000 mg | ORAL_TABLET | Freq: Two times a day (BID) | ORAL | Status: DC
  Administered 2023-06-08 – 2023-06-12 (×8): 2.5 mg via ORAL
  Filled 2023-06-08 (×9): qty 1

## 2023-06-08 NOTE — Progress Notes (Signed)
Progress Note   Patient: [facility] [id number]:[id number] DOB: 08-18-1941 DOA: 06/04/2023     4 DOS: the patient was seen and examined on 06/08/2023   Brief hospital course: [facility] is a 82 y.o. male with medical history significant for a.fib on Eliquis , cad, GERD, DM II, coming with left foot ulcer.  It has progressively gotten worse with drainage and foul odor since 10 days. No fever, pain. Pt is on insulin pump. Blood work shows mild hyponatremia of 132. Mild AKI with cr of 1.66 ands initial lactic of 2.4 which has resolved to 1.6.  Podiatry and vascular team consulted.  He is admitted to hospitalist service for further management evaluation. Vascular team performed aortogram, left lower extremity showed mild atherosclerosis but no occlusive lesions. Podiarty team performed I&D. He tolerated the procedure well.  Assessment and Plan: * Ulcer of left heel (HCC) Diabetic foot infection Aortogram showed no hemodynamic significant lesions. S/p debridement 06/07/23, will follow podiatry recommendations. Continue vancomycin and cefepime, surgical deep wound cultures. Continue dressing changes. NWB left lower extremity. PT eval per podiatry, he may need SNF placement.  Atrial fibrillation (HCC) HR stable. Resumed Eliquis.  Type 1 diabetes mellitus (HCC) Blood sugars improved. Lantus 50 units, Continue accucheks, sliding scale insulin. Carb consistent diet. Resume insulin pump upon discharge  History of coronary artery stent placement Stable no c/o chest pain.  Cont statin, Eliquis  CKD (chronic kidney disease) stage 3, GFR 30-59 ml/min (HCC) Mild worsening.  Avoid nephrotoxic drugs. Monitor daily renal function.  Anemia of chronic disease Hemoglobin stable. No active bleeding.  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  GERD without esophagitis Continue IV PPI.   OSA on CPAP Home CPAP ordered.   Hypertension  associated with type 1 diabetes mellitus (HCC) Vitals:   06/04/23 1531 06/04/23 1654 06/04/23 1730 06/04/23 1800  BP: (!) 161/48 130/81 115/72 (!) 145/58   06/04/23 1830 06/04/23 1900 06/04/23 1930 06/04/23 2000  BP: (!) 155/67 (!) 130/91 (!) 148/62 (!) 141/56   06/04/23 2030 06/04/23 2100  BP: (!) 156/79 (!) 147/69  PRN hydralazine. Ramipril held for abnormal kidney function.          Subjective: Patient is seen and examined today morning. He denies pain or discomfort.  No family at bedside. He is eating fair. Sugars improved.  Physical Exam: Vitals:   06/07/23 1935 06/08/23 0251 06/08/23 0359 06/08/23 0730  BP: 138/80  (!) 187/70 (!) 156/75  Pulse: (!) 43  (!) 54 (!) 44  Resp: 20  20 18   Temp: 97.7 F (36.5 C)  98.1 F (36.7 C) 98.3 F (36.8 C)  TempSrc: Oral  Oral Oral  SpO2: 100%  97% 100%  Weight:  97.3 kg    Height:       General - Elderly Caucasian male, no apparent distress HEENT - PERRLA, EOMI, atraumatic head, non tender sinuses. Lung - Clear, rales, rhonchi, wheezes. Heart - S1, S2 heard, no murmurs, rubs, trace pedal edema Neuro - Alert, awake and oriented x 3, non focal exam. Skin - Warm and dry.  Left heel ulcer with dressing noted Data Reviewed:     Latest Ref Rng & Units 06/07/2023    6:36 AM 06/05/2023    6:41 AM 06/04/2023    3:34 PM  CBC  WBC 4.0 - 10.5 K/uL 7.8  7.6  10.2   Hemoglobin 13.0 - 17.0 g/dL 16.0  73.7  12.4  Hematocrit 39.0 - 52.0 % 33.8  34.9  38.7   Platelets 150 - 400 K/uL 196  175  PLATELET CLUMPS NOTED ON SMEAR, UNABLE TO ESTIMATE       Latest Ref Rng & Units 06/08/2023    3:52 AM 06/07/2023    6:36 AM 06/06/2023    8:31 PM  BMP  Glucose 70 - 99 mg/dL  540  981   BUN 8 - 23 mg/dL  29    Creatinine 1.91 - 1.24 mg/dL 4.78  2.95    Sodium 621 - 145 mmol/L  135    Potassium 3.5 - 5.1 mmol/L  4.4    Chloride 98 - 111 mmol/L  104    CO2 22 - 32 mmol/L  23    Calcium 8.9 - 10.3 mg/dL  8.2     DG MINI C-ARM IMAGE ONLY  Result  Date: 06/07/2023 There is no interpretation for this exam.  This order is for images obtained during a surgical procedure.  Please See "Surgeries" Tab for more information regarding the procedure.     Family Communication: Patient lives alone, understands and agrees with current plan.  Disposition: Status is: Inpatient Remains inpatient appropriate because: post I&D, IV antibiotics pending culture results  Planned Discharge Destination: Home with Home Health    Time spent: 38 minutes  Author: Marcelino Duster, MD 06/08/2023 2:52 PM  For on call review [email or web address].

## 2023-06-08 NOTE — Progress Notes (Signed)
1 Day Post-Op   Subjective/Chief Complaint: Patient seen.  No complaints of any pain.  Feeling some better.   Objective: Vital signs in last 24 hours: Temp:  [97.1 F (36.2 C)-98.3 F (36.8 C)] 98.3 F (36.8 C) (09/21 0730) Pulse Rate:  [43-65] 44 (09/21 0730) Resp:  [10-20] 18 (09/21 0730) BP: (121-187)/(54-80) 156/75 (09/21 0730) SpO2:  [95 %-100 %] 100 % (09/21 0730) Weight:  [97.3 kg] 97.3 kg (09/21 0251) Last BM Date : 06/04/23  Intake/Output from previous day: 09/20 0701 - 09/21 0700 In: 1110.1 [I.V.:610.1; IV Piggyback:500] Out: 1800 [Urine:1725; Blood:75] Intake/Output this shift: Total I/O In: 120 [P.O.:120] Out: -   Dressing on the left foot is dry and intact.  Upon removal there is some moderate to heavy bleeding with mild drainage on the bandaging.  Incision laterally is well coapted with skin edges viable.  Significant reduction in erythema and edema.  Mepitel and staples intact plantarly.  Pictures were taken but apparently did not upload.  Lab Results:  Recent Labs    06/07/23 0636  WBC 7.8  HGB 11.4*  HCT 33.8*  PLT 196   BMET Recent Labs    06/06/23 2031 06/07/23 0636 06/08/23 0352  NA  --  135  --   K  --  4.4  --   CL  --  104  --   CO2  --  23  --   GLUCOSE 434* 238*  --   BUN  --  29*  --   CREATININE  --  1.50* 1.32*  CALCIUM  --  8.2*  --    PT/INR No results for input(s): "LABPROT", "INR" in the last 72 hours. ABG No results for input(s): "PHART", "HCO3" in the last 72 hours.  Invalid input(s): "PCO2", "PO2"  Studies/Results: DG MINI C-ARM IMAGE ONLY  Result Date: 06/07/2023 There is no interpretation for this exam.  This order is for images obtained during a surgical procedure.  Please See "Surgeries" Tab for more information regarding the procedure.    Anti-infectives: Anti-infectives (From admission, onward)    Start     Dose/Rate Route Frequency Ordered Stop   06/06/23 0951  ceFAZolin (ANCEF) IVPB 2g/100 mL premix   Status:  Discontinued        2 g 200 mL/hr over 30 Minutes Intravenous 30 min pre-op 06/06/23 0951 06/06/23 0952   06/05/23 2113  ceFAZolin (ANCEF) IVPB 2g/100 mL premix  Status:  Discontinued        2 g 200 mL/hr over 30 Minutes Intravenous 30 min pre-op 06/05/23 2113 06/06/23 0951   06/05/23 2000  vancomycin (VANCOREADY) IVPB 1250 mg/250 mL  Status:  Discontinued        1,250 mg 166.7 mL/hr over 90 Minutes Intravenous Every 24 hours 06/04/23 1953 06/05/23 1126   06/05/23 2000  vancomycin (VANCOREADY) IVPB 1500 mg/300 mL        1,500 mg 150 mL/hr over 120 Minutes Intravenous Every 24 hours 06/05/23 1126     06/04/23 2000  vancomycin (VANCOCIN) IVPB 1000 mg/200 mL premix        1,000 mg 200 mL/hr over 60 Minutes Intravenous  Once 06/04/23 1953 06/04/23 2341   06/04/23 2000  ceFEPIme (MAXIPIME) 2 g in sodium chloride 0.9 % 100 mL IVPB        2 g 200 mL/hr over 30 Minutes Intravenous Every 12 hours 06/04/23 1953     06/04/23 1715  vancomycin (VANCOCIN) IVPB 1000 mg/200 mL premix  1,000 mg 200 mL/hr over 60 Minutes Intravenous  Once 06/04/23 1701 06/04/23 1832       Assessment/Plan: s/p Procedure(s): IRRIGATION AND DEBRIDEMENT BONE OF LEFT HEEL (Left) Assessment: Stable status post I&D with debridement left foot.  Plan: Betadine and Xeroform applied to the lateral incision followed by a bulky sterile dressing.  Plan for dressing change on Monday.  LOS: 4 days    Ricci Barker 06/08/2023

## 2023-06-09 DIAGNOSIS — L97424 Non-pressure chronic ulcer of left heel and midfoot with necrosis of bone: Secondary | ICD-10-CM | POA: Diagnosis not present

## 2023-06-09 DIAGNOSIS — D638 Anemia in other chronic diseases classified elsewhere: Secondary | ICD-10-CM | POA: Diagnosis not present

## 2023-06-09 DIAGNOSIS — I825Y1 Chronic embolism and thrombosis of unspecified deep veins of right proximal lower extremity: Secondary | ICD-10-CM | POA: Diagnosis not present

## 2023-06-09 DIAGNOSIS — I480 Paroxysmal atrial fibrillation: Secondary | ICD-10-CM | POA: Diagnosis not present

## 2023-06-09 LAB — CULTURE, BLOOD (ROUTINE X 2)
Culture: NO GROWTH
Culture: NO GROWTH

## 2023-06-09 LAB — GLUCOSE, CAPILLARY
Glucose-Capillary: 175 mg/dL — ABNORMAL HIGH (ref 70–99)
Glucose-Capillary: 249 mg/dL — ABNORMAL HIGH (ref 70–99)
Glucose-Capillary: 399 mg/dL — ABNORMAL HIGH (ref 70–99)
Glucose-Capillary: 414 mg/dL — ABNORMAL HIGH (ref 70–99)
Glucose-Capillary: 418 mg/dL — ABNORMAL HIGH (ref 70–99)

## 2023-06-09 LAB — GLUCOSE, RANDOM: Glucose, Bld: 494 mg/dL — ABNORMAL HIGH (ref 70–99)

## 2023-06-09 MED ORDER — INSULIN ASPART 100 UNIT/ML IJ SOLN
15.0000 [IU] | Freq: Three times a day (TID) | INTRAMUSCULAR | Status: DC
  Administered 2023-06-09 – 2023-06-10 (×4): 15 [IU] via SUBCUTANEOUS
  Filled 2023-06-09 (×4): qty 1

## 2023-06-09 NOTE — Plan of Care (Signed)

## 2023-06-09 NOTE — Progress Notes (Signed)
Progress Note   Patient: Carl Yang KVQ:[id number] DOB: 08/16/1941 DOA: 06/04/2023     5 DOS: the patient was seen and examined on 06/09/2023   Brief hospital course: Osaze Kleinfeldt is a 82 y.o. male with medical history significant for a.fib on Eliquis , cad, GERD, DM II, coming with left foot ulcer.  It has progressively gotten worse with drainage and foul odor since 10 days. No fever, pain. Pt is on insulin pump. Blood work shows mild hyponatremia of 132. Mild AKI with cr of 1.66 ands initial lactic of 2.4 which has resolved to 1.6.  Podiatry and vascular team consulted.  He is admitted to hospitalist service for further management evaluation. Vascular team performed aortogram, left lower extremity showed mild atherosclerosis but no occlusive lesions. Podiarty team performed I&D. He tolerated the procedure well.  Assessment and Plan: * Ulcer of left heel (HCC) Diabetic foot infection Aortogram showed no hemodynamic significant lesions. S/p debridement 06/07/23, will follow podiatry recommendations. Continue vancomycin and cefepime, surgical deep wound cultures. Continue dressing changes. NWB left lower extremity. Dressing changes per podiatry, he may need SNF placement.  Atrial fibrillation (HCC) HR stable. Resumed Eliquis.  Type 1 diabetes mellitus (HCC) Blood sugars high Lantus 50 units, pre meal increased to 15.  Continue accucheks, sliding scale insulin. Carb consistent diet. Resume insulin pump upon discharge  History of coronary artery stent placement Stable no c/o chest pain.  Cont statin, Eliquis  CKD (chronic kidney disease) stage 3, GFR 30-59 ml/min (HCC) Mild worsening.  Avoid nephrotoxic drugs. Monitor daily renal function.  Anemia of chronic disease Hemoglobin stable. No active bleeding.  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  GERD without esophagitis Continue IV PPI.   OSA on CPAP Home  CPAP ordered.   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 Ramipril held for abnormal kidney function.          Subjective: Patient is seen and examined today morning. He is lying in bed. Did get out of bed to bedside commode. He is NWB left lower extremity.  No family at bedside. He is eating fair. Sugars high  Physical Exam: Vitals:   06/09/23 0557 06/09/23 0638 06/09/23 0725 06/09/23 1621  BP: (!) 156/73 (!) 157/69 (!) 162/75 (!) 155/78  Pulse: (!) 45 (!) 56 (!) 59 (!) 51  Resp:   16 18  Temp:   (!) 97.1 F (36.2 C) 98.2 F (36.8 C)  TempSrc:      SpO2:   98% 98%  Weight:      Height:       General - Elderly Caucasian male, no apparent distress HEENT - PERRLA, EOMI, atraumatic head, non tender sinuses. Lung - Clear, rales, rhonchi, wheezes. Heart - S1, S2 heard, no murmurs, rubs, trace pedal edema Neuro - Alert, awake and oriented x 3, non focal exam. Skin - Warm and dry.  Left heel ulcer with dressing noted Data Reviewed:     Latest Ref Rng & Units 06/07/2023    6:36 AM 06/05/2023    6:41 AM 06/04/2023    3:34 PM  CBC  WBC 4.0 - 10.5  K/uL 7.8  7.6  10.2   Hemoglobin 13.0 - 17.0 g/dL 87.5  64.3  32.9   Hematocrit 39.0 - 52.0 % 33.8  34.9  38.7   Platelets 150 - 400 K/uL 196  175  PLATELET CLUMPS NOTED ON SMEAR, UNABLE TO ESTIMATE       Latest Ref Rng & Units 06/09/2023   12:07 PM 06/08/2023    3:52 AM 06/07/2023    6:36 AM  BMP  Glucose 70 - 99 mg/dL 518   841   BUN 8 - 23 mg/dL   29   Creatinine 6.60 - 1.24 mg/dL  6.30  1.60   Sodium 109 - 145 mmol/L   135   Potassium 3.5 - 5.1 mmol/L   4.4   Chloride 98 - 111 mmol/L   104   CO2 22 - 32 mmol/L   23    Calcium 8.9 - 10.3 mg/dL   8.2    No results found.   Family Communication: Patient lives alone, understands and agrees with current plan.  Disposition: Status is: Inpatient Remains inpatient appropriate because: post I&D, IV antibiotics pending culture results, high sugars  Planned Discharge Destination: Home with Home Health    Time spent: 39 minutes  Author: Marcelino Duster, MD 06/09/2023 5:13 PM  For on call review [email or web address].

## 2023-06-09 NOTE — Plan of Care (Signed)
  Problem: Metabolic: Goal: Ability to maintain appropriate glucose levels will improve 06/09/2023 1439 by Wendee Beavers, RN Outcome: Progressing 06/09/2023 1439 by Wendee Beavers, RN Outcome: Progressing   Problem: Nutritional: Goal: Maintenance of adequate nutrition will improve 06/09/2023 1439 by Wendee Beavers, RN Outcome: Progressing 06/09/2023 1439 by Wendee Beavers, RN Outcome: Progressing Goal: Progress toward achieving an optimal weight will improve 06/09/2023 1439 by Wendee Beavers, RN Outcome: Progressing 06/09/2023 1439 by Wendee Beavers, RN Outcome: Progressing   Problem: Skin Integrity: Goal: Risk for impaired skin integrity will decrease 06/09/2023 1439 by Wendee Beavers, RN Outcome: Progressing 06/09/2023 1439 by Wendee Beavers, RN Outcome: Progressing   Problem: Tissue Perfusion: Goal: Adequacy of tissue perfusion will improve 06/09/2023 1439 by Wendee Beavers, RN Outcome: Progressing 06/09/2023 1439 by Wendee Beavers, RN Outcome: Progressing   Problem: Pain Managment: Goal: General experience of comfort will improve 06/09/2023 1439 by Wendee Beavers, RN Outcome: Progressing 06/09/2023 1439 by Wendee Beavers, RN Outcome: Progressing   Problem: Elimination: Goal: Will not experience complications related to bowel motility 06/09/2023 1439 by Wendee Beavers, RN Outcome: Progressing 06/09/2023 1439 by Wendee Beavers, RN Outcome: Progressing Goal: Will not experience complications related to urinary retention 06/09/2023 1439 by Wendee Beavers, RN Outcome: Progressing 06/09/2023 1439 by Wendee Beavers, RN Outcome: Progressing   Problem: Safety: Goal: Ability to remain free from injury will improve Outcome: Progressing

## 2023-06-10 ENCOUNTER — Inpatient Hospital Stay: Payer: Medicare Other

## 2023-06-10 DIAGNOSIS — I480 Paroxysmal atrial fibrillation: Secondary | ICD-10-CM | POA: Diagnosis not present

## 2023-06-10 DIAGNOSIS — E1165 Type 2 diabetes mellitus with hyperglycemia: Secondary | ICD-10-CM

## 2023-06-10 DIAGNOSIS — I825Y1 Chronic embolism and thrombosis of unspecified deep veins of right proximal lower extremity: Secondary | ICD-10-CM | POA: Diagnosis not present

## 2023-06-10 DIAGNOSIS — D638 Anemia in other chronic diseases classified elsewhere: Secondary | ICD-10-CM | POA: Diagnosis not present

## 2023-06-10 DIAGNOSIS — L97424 Non-pressure chronic ulcer of left heel and midfoot with necrosis of bone: Secondary | ICD-10-CM | POA: Diagnosis not present

## 2023-06-10 LAB — CBC
HCT: 34.2 % — ABNORMAL LOW (ref 39.0–52.0)
Hemoglobin: 11.5 g/dL — ABNORMAL LOW (ref 13.0–17.0)
MCH: 31 pg (ref 26.0–34.0)
MCHC: 33.6 g/dL (ref 30.0–36.0)
MCV: 92.2 fL (ref 80.0–100.0)
Platelets: 210 K/uL (ref 150–400)
RBC: 3.71 MIL/uL — ABNORMAL LOW (ref 4.22–5.81)
RDW: 13.6 % (ref 11.5–15.5)
WBC: 8.9 K/uL (ref 4.0–10.5)
nRBC: 0 % (ref 0.0–0.2)

## 2023-06-10 LAB — BASIC METABOLIC PANEL
Anion gap: 7 (ref 5–15)
BUN: 23 mg/dL (ref 8–23)
CO2: 21 mmol/L — ABNORMAL LOW (ref 22–32)
Calcium: 8.4 mg/dL — ABNORMAL LOW (ref 8.9–10.3)
Chloride: 107 mmol/L (ref 98–111)
Creatinine, Ser: 1.27 mg/dL — ABNORMAL HIGH (ref 0.61–1.24)
GFR, Estimated: 57 mL/min — ABNORMAL LOW (ref >60)
Glucose, Bld: 120 mg/dL — ABNORMAL HIGH (ref 70–99)
Potassium: 3.7 mmol/L (ref 3.5–5.1)
Sodium: 135 mmol/L (ref 135–145)

## 2023-06-10 LAB — AEROBIC/ANAEROBIC CULTURE W GRAM STAIN (SURGICAL/DEEP WOUND)

## 2023-06-10 LAB — GLUCOSE, CAPILLARY
Glucose-Capillary: 109 mg/dL — ABNORMAL HIGH (ref 70–99)
Glucose-Capillary: 138 mg/dL — ABNORMAL HIGH (ref 70–99)
Glucose-Capillary: 354 mg/dL — ABNORMAL HIGH (ref 70–99)
Glucose-Capillary: 285 mg/dL — ABNORMAL HIGH (ref 70–99)
Glucose-Capillary: 236 mg/dL — ABNORMAL HIGH (ref 70–99)

## 2023-06-10 LAB — SURGICAL PATHOLOGY

## 2023-06-10 MED ORDER — LACTULOSE 10 GM/15ML PO SOLN
20.0000 g | Freq: Two times a day (BID) | ORAL | Status: DC | PRN
  Administered 2023-06-10 – 2023-06-11 (×3): 20 g via ORAL
  Filled 2023-06-10 (×3): qty 30

## 2023-06-10 MED ORDER — INSULIN ASPART 100 UNIT/ML IJ SOLN
0.0000 [IU] | Freq: Every day | INTRAMUSCULAR | Status: DC
  Administered 2023-06-10: 2 [IU] via SUBCUTANEOUS
  Administered 2023-06-11: 3 [IU] via SUBCUTANEOUS
  Filled 2023-06-10 (×2): qty 1

## 2023-06-10 MED ORDER — PIPERACILLIN-TAZOBACTAM 3.375 G IVPB
3.3750 g | Freq: Three times a day (TID) | INTRAVENOUS | Status: DC
  Administered 2023-06-10 – 2023-06-12 (×5): 3.375 g via INTRAVENOUS
  Filled 2023-06-10 (×5): qty 50

## 2023-06-10 NOTE — TOC Initial Note (Signed)
Transition of Care [facility]) - Initial/Assessment Note    Patient Details  Name: Carl Yang MRN: [id number] Date of Birth: 05-Feb-1941  [facility] of [facility]) CM/SW Contact:    Carl Rua, LCSW Phone Number: 06/10/2023, 1:40 PM  Clinical Narrative:                  CSW spoke with patient daughter as patient noted to be slightly confused, patient daughter [facility] confirms patient is from [facility] and wants SNF there per PT recommendations.   CSW spoke with [facility] who confirms patient can come to their snf at [facility] (called [facility]) once medically stable.    Expected Discharge Plan: Skilled Nursing Facility Barriers to Discharge: Continued Medical Work up   Patient Goals and CMS Choice Patient states their goals for this hospitalization and ongoing recovery are:: to go home CMS Medicare.gov Compare Post Acute Care list provided to:: Patient Represenative (must comment) (daughter [facility]) Choice offered to / list presented to : [facility] POA / Guardian      Expected Discharge Plan and Services       Living arrangements for the past 2 months:  (vill[place] [facility])                                      Prior Living Arrangements/Services Living arrangements for the past 2 months:  (village at [facility])                     Activities of Daily Living Home Assistive Devices/Equipment: Gilmer Mor (specify quad or straight) ADL Screening (condition at time of admission) Patient's cognitive ability adequate to safely complete daily activities?: Yes Is the patient deaf or have difficulty hearing?: Yes Does the patient have difficulty seeing, even when wearing glasses/contacts?: Yes Does the patient have difficulty concentrating, remembering, or making decisions?: Yes Patient able to express need for assistance with ADLs?: Yes Does the patient have difficulty dressing or bathing?: No Independently performs ADLs?: Yes (appropriate for  developmental age) Does the patient have difficulty walking or climbing stairs?: Yes Weakness of Legs: Left Weakness of Arms/Hands: None  Permission Sought/Granted                  Emotional Assessment              Admission diagnosis:  Wound infection [T14.8XXA, L08.9] Cellulitis of left lower extremity [L03.116] Ulcer of left heel (HCC) [L97.429] Patient Active Problem List   Diagnosis Date Noted   Ulcer of left heel (HCC) 06/04/2023   Atrial fibrillation (HCC)    History of coronary artery stent placement 06/28/2020   PAD (peripheral artery disease) (HCC) 06/28/2020   Status post left partial knee replacement 02/09/2020   Anemia of chronic disease 09/01/2019   CKD (chronic kidney disease) stage 3, GFR 30-59 ml/min (HCC) 09/01/2019   Status post revision of total knee replacement, right 05/05/2019   Diabetic retinopathy associated with type 1 diabetes mellitus (HCC) 04/20/2019   History of coronary artery disease 10/06/2018   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) 07/28/2018   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, with long-term current use of insulin (HCC) 07/28/2018   Hypertension associated with type 1 diabetes mellitus (HCC) 07/28/2018   OSA on CPAP 07/28/2018   GERD without esophagitis 07/28/2018   Chronic non-seasonal allergic rhinitis 07/28/2018   Chronic constipation 07/28/2018  Primary osteoarthritis of right knee 07/28/2018   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) 07/28/2018   Status post total knee replacement using cement, right 07/15/2018   Status post revision of total replacement of right knee 07/15/2018   Primary osteoarthritis of left knee 05/23/2017   Medicare annual wellness visit, subsequent 03/05/2017   History of chronic health problem 03/13/2016   Vaccine counseling 03/13/2016   History of amputation of lesser toe of left foot (HCC) 10/20/2015   History of malignant  melanoma of skin 12/24/2014   H/O adenomatous polyp of colon 08/18/2014   History of DVT (deep vein thrombosis) 05/05/2014   Type 1 diabetes mellitus (HCC) 03/23/2014   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 [place] [address] Phone: [phone] Fax: [phone]  [facility] - [address] [place], [place] - [address] [place] [address] Phone: [phone] Fax: [phone]     Social Determinants of Health (SDOH) Social History: SDOH Screenings   Food Insecurity: No Food Insecurity (06/05/2023)  Housing: Patient Declined (06/05/2023)  Transportation Needs: No Transportation Needs (06/05/2023)  Utilities: Not At Risk (06/05/2023)  Depression (PHQ2-9): Low Risk  (08/18/2019)  Financial Resource Strain: Low Risk  (05/07/2023)   Received from [facility]  Tobacco Use: Low Risk  (06/07/2023)   SDOH Interventions:     Readmission Risk Interventions     No data to display

## 2023-06-10 NOTE — Care Management Important Message (Signed)
Important Message  Patient Details  Name: Jaivan Ure MRN: [id number] Date of Birth: 10-05-1940   Medicare Important Message Given:  Yes     Johnell Comings 06/10/2023, 10:38 AM

## 2023-06-10 NOTE — [place] FL2 (Signed)
[place] MEDICAID FL2 LEVEL OF CARE FORM     IDENTIFICATION  Patient Name: Carl Yang Birthdate: May 31, 1941 Sex: male Admission Date (Current Location): 06/04/2023  [facility] and [place] Number:  Chiropodist and Address:  [facility], [address] [place], [place] [address]      Provider Number: [id number]  Attending Physician Name and Address:  Marcelino Duster, MD  Relative Name and Phone Number:  [name] (daughter)  [phone]    Current Level of Care: Hospital Recommended Level of Care: Skilled Nursing Facility Prior Approval Number:    Date Approved/Denied:   PASRR Number: [id number]  Discharge Plan: SNF    Current Diagnoses: Patient Active Problem List   Diagnosis Date Noted   Ulcer of left heel (HCC) 06/04/2023   Atrial fibrillation (HCC)    History of coronary artery stent placement 06/28/2020   PAD (peripheral artery disease) (HCC) 06/28/2020   Status post left partial knee replacement 02/09/2020   Anemia of chronic disease 09/01/2019   CKD (chronic kidney disease) stage 3, GFR 30-59 ml/min (HCC) 09/01/2019   Status post revision of total knee replacement, right 05/05/2019   Diabetic retinopathy associated with type 1 diabetes mellitus (HCC) 04/20/2019   History of coronary artery disease 10/06/2018   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) 07/28/2018   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, with long-term current use of insulin (HCC) 07/28/2018   Hypertension associated with type 1 diabetes mellitus (HCC) 07/28/2018   OSA on CPAP 07/28/2018   GERD without esophagitis 07/28/2018   Chronic non-seasonal allergic rhinitis 07/28/2018   Chronic constipation 07/28/2018   Primary osteoarthritis of right knee 07/28/2018   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) 07/28/2018   Status post total knee replacement using cement, right 07/15/2018   Status  post revision of total replacement of right knee 07/15/2018   Primary osteoarthritis of left knee 05/23/2017   Medicare annual wellness visit, subsequent 03/05/2017   History of chronic health problem 03/13/2016   Vaccine counseling 03/13/2016   History of amputation of lesser toe of left foot (HCC) 10/20/2015   History of malignant melanoma of skin 12/24/2014   H/O adenomatous polyp of colon 08/18/2014   History of DVT (deep vein thrombosis) 05/05/2014   Type 1 diabetes mellitus (HCC) 03/23/2014   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) 03/23/2014    Orientation RESPIRATION BLADDER Height & Weight     Self, Situation, Place  Normal Continent Weight: 210 lb 12.2 oz (95.6 kg) Height:  6\' 3"  (190.5 cm)  BEHAVIORAL SYMPTOMS/MOOD NEUROLOGICAL BOWEL NUTRITION STATUS      Continent Diet (see discharge summary)  AMBULATORY STATUS COMMUNICATION OF NEEDS Skin   Limited Assist Verbally Other (Comment) (left heel closed incision, left diabetic foot ulcer)                       Personal Care Assistance Level of Assistance  Bathing, Feeding, Dressing, Total care Bathing Assistance: Limited assistance Feeding assistance: Independent Dressing Assistance: Limited assistance Total Care Assistance: Limited assistance   Functional Limitations Info  Sight, Hearing, Speech Sight Info: Impaired Hearing Info: Adequate Speech Info: Adequate    SPECIAL CARE FACTORS FREQUENCY  PT (By licensed PT), OT (By licensed OT)     PT Frequency: min 4x weekly OT Frequency: min 4x weekly            Contractures Contractures Info: Not present  Additional Factors Info  Code Status, Allergies Code Status Info: full Allergies Info: NKA           Current Medications (06/10/2023):  This is the current hospital active medication list Current Facility-Administered Medications  Medication Dose Route Frequency Provider Last Rate Last Admin   0.9 %  sodium chloride infusion  250 mL  Intravenous PRN Linus Galas, DPM       0.9 %  sodium chloride infusion   Intravenous PRN Marcelino Duster, MD 10 mL/hr at 06/10/23 0211 Infusion Verify at 06/10/23 0211   acetaminophen (TYLENOL) tablet 650 mg  650 mg Oral Q6H PRN Linus Galas, DPM       Or   acetaminophen (TYLENOL) suppository 650 mg  650 mg Rectal Q6H PRN Linus Galas, DPM       acetaminophen (TYLENOL) tablet 650 mg  650 mg Oral Q4H PRN Linus Galas, DPM       apixaban Everlene Balls) tablet 2.5 mg  2.5 mg Oral BID Marcelino Duster, MD   2.5 mg at 06/10/23 0836   ceFEPIme (MAXIPIME) 2 g in sodium chloride 0.9 % 100 mL IVPB  2 g Intravenous Q12H Linus Galas, DPM 200 mL/hr at 06/10/23 1146 2 g at 06/10/23 1146   docusate sodium (COLACE) capsule 100 mg  100 mg Oral BID Marcelino Duster, MD   100 mg at 06/10/23 0835   fentaNYL (SUBLIMAZE) injection 12.5 mcg  12.5 mcg Intravenous Once PRN Linus Galas, DPM       hydrALAZINE (APRESOLINE) injection 5 mg  5 mg Intravenous Q4H PRN Linus Galas, DPM   5 mg at 06/09/23 0551   HYDROcodone-acetaminophen (NORCO/VICODIN) 5-325 MG per tablet 1-2 tablet  1-2 tablet Oral Q4H PRN Linus Galas, DPM       HYDROmorphone (DILAUDID) injection 1 mg  1 mg Intravenous Once PRN Linus Galas, DPM       insulin aspart (novoLOG) injection 0-5 Units  0-5 Units Subcutaneous QHS Sreeram, Narendranath, MD       insulin aspart (novoLOG) injection 0-9 Units  0-9 Units Subcutaneous TID WC Linus Galas, DPM   9 Units at 06/10/23 1315   insulin aspart (novoLOG) injection 15 Units  15 Units Subcutaneous TID WC Marcelino Duster, MD   15 Units at 06/10/23 1315   insulin glargine-yfgn (SEMGLEE) injection 50 Units  50 Units Subcutaneous Daily Linus Galas, DPM   50 Units at 06/10/23 0834   lactulose (CHRONULAC) 10 GM/15ML solution 20 g  20 g Oral BID PRN Marcelino Duster, MD   20 g at 06/10/23 1316   morphine (PF) 2 MG/ML injection 2 mg  2 mg Intravenous Q4H PRN Linus Galas, DPM       morphine (PF) 4 MG/ML  injection 2 mg  2 mg Intravenous Q1H PRN Linus Galas, DPM       ondansetron [facility]) injection 4 mg  4 mg Intravenous Q6H PRN Linus Galas, DPM       ondansetron [facility]) injection 4 mg  4 mg Intravenous Q6H PRN Linus Galas, DPM       oxyCODONE (Oxy IR/ROXICODONE) immediate release tablet 5-10 mg  5-10 mg Oral Q4H PRN Linus Galas, DPM       pantoprazole (PROTONIX) injection 40 mg  40 mg Intravenous Q12H Linus Galas, DPM   40 mg at 06/10/23 0836   polyethylene glycol (MIRALAX / GLYCOLAX) packet 17 g  17 g Oral Daily Marcelino Duster, MD   17 g at 06/09/23 0942   rosuvastatin (CRESTOR) tablet 40 mg  40 mg Oral Daily Linus Galas, DPM   40 mg at 06/10/23 6440   senna (SENOKOT) tablet 17.2 mg  2 tablet Oral QHS Marcelino Duster, MD   17.2 mg at 06/09/23 2101   sodium chloride flush (NS) 0.9 % injection 3 mL  3 mL Intravenous Q12H Linus Galas, DPM   3 mL at 06/08/23 2152   sodium chloride flush (NS) 0.9 % injection 3 mL  3 mL Intravenous Q12H Linus Galas, DPM   3 mL at 06/10/23 0837   sodium chloride flush (NS) 0.9 % injection 3 mL  3 mL Intravenous PRN Linus Galas, DPM       vancomycin (VANCOREADY) IVPB 1500 mg/300 mL  1,500 mg Intravenous Q24H Linus Galas, DPM   Stopped at 06/09/23 2306     Discharge Medications: Please see discharge summary for a list of discharge medications.  Relevant Imaging Results:  Relevant Lab Results:   Additional Information SSN: [id number]  Darolyn Rua, LCSW

## 2023-06-10 NOTE — Evaluation (Signed)
Occupational Therapy Evaluation Patient Details Name: Carl Yang MRN: [id number] DOB: [birth year] Today's Date: 06/10/2023   History of Present Illness 82 y/o male presented to ED from Dr. Irene Limbo office on 06/04/23 for L heel wound. S/p L foot I&D on 9/20. PMH: Afib on Eliquis, CAD, T2DM, GERD, CKD stage III, OSA on CPAP   Clinical Impression   Patient presenting with decreased Ind in self care,balance, functional mobility/transfers, endurance, and strength. Patient reports living in ILF with use of SPC for mobility, driving, and Ind with ADLs and IADLS PTA. Pt currently performs bed mobility without physical assistance. OT reviewed NWB precautions and pt needing cues throughout session to comply. Pt stands from elevated bed and BSC height with mod lifting assistance and use of RW. Stand pivot transfer bed <>BSC with min- mod A. Pt was able to perform 5 reps of chair pushups from [facility] with supervision. Sit >supine with supervision. Patient will benefit from acute OT to increase overall independence in the areas of ADLs, functional mobility, and safety awareness in order to safely discharge.       If plan is discharge home, recommend the following: A lot of help with walking and/or transfers;A lot of help with bathing/dressing/bathroom;Assistance with cooking/housework;Assist for transportation;Help with stairs or ramp for entrance;Direct supervision/assist for medications management    Functional Status Assessment  Patient has had a recent decline in their functional status and demonstrates the ability to make significant improvements in function in a reasonable and predictable amount of time.  Equipment Recommendations  Other (comment) (defer)       Precautions / Restrictions Precautions Precautions: Fall Restrictions Weight Bearing Restrictions: Yes LLE Weight Bearing: Non weight bearing      Mobility Bed Mobility Overal bed mobility: Modified Independent                   Transfers Overall transfer level: Needs assistance Equipment used: Rolling walker (2 wheels) Transfers: Sit to/from Stand, Bed to chair/wheelchair/BSC Sit to Stand: Mod assist, From elevated surface           General transfer comment: On first attempt, pt needing second person to hold L LE in NWB for safety but on second attempt able maintain himself      Balance Overall balance assessment: Needs assistance Sitting-balance support: No upper extremity supported, Feet supported Sitting balance-Leahy Scale: Good     Standing balance support: Bilateral upper extremity supported, Reliant on assistive device for balance Standing balance-Leahy Scale: Fair                             ADL either performed or assessed with clinical judgement   ADL Overall ADL's : Needs assistance/impaired                         Toilet Transfer: Moderate assistance                   Vision Patient Visual Report: No change from baseline              Pertinent Vitals/Pain Pain Assessment Pain Assessment: No/denies pain Pain Score: 0-No pain     Extremity/Trunk Assessment Upper Extremity Assessment Upper Extremity Assessment: Overall WFL for tasks assessed   Lower Extremity Assessment Lower Extremity Assessment: Generalized weakness;LLE deficits/detail LLE Deficits / Details: NWB       Communication Communication Communication: No apparent difficulties   Cognition Arousal: Alert Behavior  During Therapy: WFL for tasks assessed/performed Overall Cognitive Status: Within Functional Limits for tasks assessed                                 General Comments: Pt is cooperative with therapist and follows 1 steps commands but some general confusion with questions of PLOF                Home Living Family/patient expects to be discharged to:: Private residence Living Arrangements: Alone   Type of Home: Independent living  facility Home Access: Level entry     Home Layout: One level     Bathroom Shower/Tub: Producer, television/film/video: Handicapped height     Home Equipment: Cane - single point;Other (comment) (knee scooter)   Additional Comments: lives at [facility]      Prior Functioning/Environment Prior Level of Function : Independent/Modified Independent;Driving             Mobility Comments: recently has been using cane due to heel wound ADLs Comments: Pt reports Mod I with cane for self care and IADLS, driving, and going to dinning room for meals.        OT Problem List: Decreased strength;Decreased activity tolerance;Decreased safety awareness;Impaired balance (sitting and/or standing);Decreased knowledge of use of DME or AE      OT Treatment/Interventions: Self-care/ADL training;Therapeutic exercise;Therapeutic activities;Energy conservation;DME and/or AE instruction;Balance training    OT Goals(Current goals can be found in the care plan section) Acute Rehab OT Goals Patient Stated Goal: to go to rehab OT Goal Formulation: With patient Time For Goal Achievement: 06/24/23 Potential to Achieve Goals: Fair ADL Goals Pt Will Perform Grooming: sitting;with modified independence Pt Will Perform Lower Body Dressing: sitting/lateral leans;with min assist Pt Will Transfer to Toilet: stand pivot transfer;with min assist Pt Will Perform Toileting - Clothing Manipulation and hygiene: with min assist;sitting/lateral leans  OT Frequency: Min 1X/week       AM-PAC OT "6 Clicks" Daily Activity     Outcome Measure Help from another person eating meals?: None Help from another person taking care of personal grooming?: None Help from another person toileting, which includes using toliet, bedpan, or urinal?: A Lot Help from another person bathing (including washing, rinsing, drying)?: A Lot Help from another person to put on and taking off regular upper body clothing?: A  Little Help from another person to put on and taking off regular lower body clothing?: A Lot 6 Click Score: 17   End of Session Equipment Utilized During Treatment: Rolling walker (2 wheels)  Activity Tolerance: Patient tolerated treatment well Patient left: in bed;with call bell/phone within reach;with bed alarm set  OT Visit Diagnosis: Unsteadiness on feet (R26.81);Muscle weakness (generalized) (M62.81)                Time: 3244-0102 OT Time Calculation (min): 25 min Charges:  OT General Charges $OT Visit: 1 Visit OT Evaluation $OT Eval Moderate Complexity: 1 Mod OT Treatments $Self Care/Home Management : 8-22 mins  Jackquline Denmark, MS, OTR/L , CBIS ascom [phone]  06/10/23, 4:05 PM

## 2023-06-10 NOTE — Progress Notes (Signed)
3 Days Post-Op   Subjective/Chief Complaint: Patient seen.  No complaints.   Objective: Vital signs in last 24 hours: Temp:  [97.6 F (36.4 C)-98.2 F (36.8 C)] 98.1 F (36.7 C) (09/23 1127) Pulse Rate:  [51-106] 75 (09/23 1127) Resp:  [16-20] 16 (09/23 1127) BP: (145-160)/(53-82) 145/72 (09/23 1127) SpO2:  [98 %-100 %] 100 % (09/23 1127) Weight:  [95.6 kg] 95.6 kg (09/23 0400) Last BM Date : 06/04/23  Intake/Output from previous day: 09/22 0701 - 09/23 0700 In: 1458.2 [P.O.:480; I.V.:62.1; IV Piggyback:916.1] Out: 900 [Urine:900] Intake/Output this shift: Total I/O In: -  Out: 150 [Urine:150]  Mild bleeding and drainage noted on the bandaging from the plantar ulceration.  Incision is well coapted with no bleeding or drainage.  Lab Results:  Recent Labs    06/10/23 0454  WBC 8.9  HGB 11.5*  HCT 34.2*  PLT 210   BMET Recent Labs    06/08/23 0352 06/09/23 1207 06/10/23 0454  NA  --   --  135  K  --   --  3.7  CL  --   --  107  CO2  --   --  21*  GLUCOSE  --  494* 120*  BUN  --   --  23  CREATININE 1.32*  --  1.27*  CALCIUM  --   --  8.4*   PT/INR No results for input(s): "LABPROT", "INR" in the last 72 hours. ABG No results for input(s): "PHART", "HCO3" in the last 72 hours.  Invalid input(s): "PCO2", "PO2"  Studies/Results: No results found.  Anti-infectives: Anti-infectives (From admission, onward)    Start     Dose/Rate Route Frequency Ordered Stop   06/06/23 0951  ceFAZolin (ANCEF) IVPB 2g/100 mL premix  Status:  Discontinued        2 g 200 mL/hr over 30 Minutes Intravenous 30 min pre-op 06/06/23 0951 06/06/23 0952   06/05/23 2113  ceFAZolin (ANCEF) IVPB 2g/100 mL premix  Status:  Discontinued        2 g 200 mL/hr over 30 Minutes Intravenous 30 min pre-op 06/05/23 2113 06/06/23 0951   06/05/23 2000  vancomycin (VANCOREADY) IVPB 1250 mg/250 mL  Status:  Discontinued        1,250 mg 166.7 mL/hr over 90 Minutes Intravenous Every 24 hours  06/04/23 1953 06/05/23 1126   06/05/23 2000  vancomycin (VANCOREADY) IVPB 1500 mg/300 mL        1,500 mg 150 mL/hr over 120 Minutes Intravenous Every 24 hours 06/05/23 1126     06/04/23 2000  vancomycin (VANCOCIN) IVPB 1000 mg/200 mL premix        1,000 mg 200 mL/hr over 60 Minutes Intravenous  Once 06/04/23 1953 06/04/23 2341   06/04/23 2000  ceFEPIme (MAXIPIME) 2 g in sodium chloride 0.9 % 100 mL IVPB        2 g 200 mL/hr over 30 Minutes Intravenous Every 12 hours 06/04/23 1953     06/04/23 1715  vancomycin (VANCOCIN) IVPB 1000 mg/200 mL premix        1,000 mg 200 mL/hr over 60 Minutes Intravenous  Once 06/04/23 1701 06/04/23 1832       Assessment/Plan: s/p Procedure(s): IRRIGATION AND DEBRIDEMENT BONE OF LEFT HEEL (Left) Assessment: Stable status post debridement left heel.  Plan: Betadine applied to the incision followed by a bulky sterile bandage.  Pathology returned with no evidence of osteomyelitis.  I think patient can the released on oral antibiotics.  Recommend Augmentin and Cipro as  there did appear to be a Pseudomonas component clinically.  Discussed with the patient that he will need to be completely nonweightbearing on the left lower extremity until advised otherwise.  Patient will need dressing changes 3 times a week upon discharge.  I will plan on following up with the patient in 1 week outpatient for reevaluation.  LOS: 6 days    Ricci Barker 06/10/2023

## 2023-06-10 NOTE — Progress Notes (Signed)
Progress Note   Patient: Carl Yang EPP:[id number] DOB: Jul 13, 1941 DOA: 06/04/2023     6 DOS: the patient was seen and examined on 06/10/2023   Brief hospital course: Carl Yang is a 82 y.o. male with medical history significant for a.fib on Eliquis , cad, GERD, DM II, coming with left foot ulcer.  It has progressively gotten worse with drainage and foul odor since 10 days. No fever, pain. Pt is on insulin pump. Blood work shows mild hyponatremia of 132. Mild AKI with cr of 1.66 ands initial lactic of 2.4 which has resolved to 1.6.  Podiatry and vascular team consulted.  He is admitted to hospitalist service for further management evaluation. Vascular team performed aortogram, left lower extremity showed mild atherosclerosis but no occlusive lesions. Podiarty team performed I&D. He tolerated the procedure well.  Assessment and Plan: * Ulcer of left heel (HCC) Diabetic foot infection Aortogram showed no hemodynamic significant lesions. S/p debridement 06/07/23, will follow podiatry recommendations. vancomycin and cefepime changed to zosyn per surgical deep wound cultures. Continue dressing changes. NWB left lower extremity. Dressing changes per podiatry,  TOC working on SNF placement.  Atrial fibrillation (HCC) HR stable. Resumed Eliquis.  Type 1 diabetes mellitus (HCC) Blood sugars improved. Lantus 50 units, pre meal 15 units Continue accucheks, sliding scale insulin with night coverage. Carb consistent diet. Resume insulin pump upon discharge  History of coronary artery stent placement Stable no c/o chest pain.  Cont statin, Eliquis  CKD (chronic kidney disease) stage 3, GFR 30-59 ml/min (HCC) Mild worsening.  Avoid nephrotoxic drugs. Monitor daily renal function.  Anemia of chronic disease Hemoglobin stable. No active bleeding.  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  GERD without esophagitis Continue  IV PPI.   OSA on CPAP Home CPAP ordered.   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 Ramipril held for abnormal kidney function.          Subjective: Patient is seen and examined today morning. He is lying in bed. Advised out of bed with PT. He is NWB left lower extremity.  No family at bedside. He is eating fair. Sugars better.  Physical Exam: Vitals:   06/10/23 0359 06/10/23 0400 06/10/23 0744 06/10/23 1127  BP: (!) 151/53  (!) 150/76 (!) 145/72  Pulse: (!) 106  81 75  Resp: 18  17 16   Temp: 97.6 F (36.4 C)  97.9 F (36.6 C) 98.1 F (36.7 C)  TempSrc: Oral  Oral Oral  SpO2: 98%  99% 100%  Weight:  95.6 kg    Height:       General - Elderly Caucasian male, no apparent distress HEENT - PERRLA, EOMI, atraumatic head, non tender sinuses. Lung - Clear, rales, rhonchi, wheezes. Heart - S1, S2 heard, no murmurs, rubs, trace pedal edema Neuro - Alert, awake and oriented x 3, non focal exam. Skin - Warm and dry.  Left heel ulcer with dressing noted Data Reviewed:     Latest Ref Rng & Units 06/10/2023    4:54 AM 06/07/2023    6:36 AM 06/05/2023    6:41 AM  CBC  WBC 4.0 -  10.5 K/uL 8.9  7.8  7.6   Hemoglobin 13.0 - 17.0 g/dL 16.1  09.6  04.5   Hematocrit 39.0 - 52.0 % 34.2  33.8  34.9   Platelets 150 - 400 K/uL 210  196  175       Latest Ref Rng & Units 06/10/2023    4:54 AM 06/09/2023   12:07 PM 06/08/2023    3:52 AM  BMP  Glucose 70 - 99 mg/dL 409  811    BUN 8 - 23 mg/dL 23     Creatinine 9.14 - 1.24 mg/dL 7.82   9.56   Sodium 213 - 145 mmol/L 135     Potassium 3.5 - 5.1 mmol/L 3.7     Chloride 98 - 111 mmol/L 107     CO2 22 - 32 mmol/L 21     Calcium 8.9  - 10.3 mg/dL 8.4      No results found.   Family Communication: Patient lives alone, understands and agrees with current plan.  Disposition: Status is: Inpatient Remains inpatient appropriate because: post I&D, IV antibiotics pending culture results, improved sugars  Planned Discharge Destination: Skilled nursing facility    Time spent: 37 minutes  Author: Marcelino Duster, MD 06/10/2023 5:26 PM  For on call review [email or web address].

## 2023-06-10 NOTE — Inpatient Diabetes Management (Signed)
Inpatient Diabetes Program Recommendations  AACE/ADA: New Consensus Statement on Inpatient Glycemic Control  Target Ranges:  Prepandial:   less than 140 mg/dL      Peak postprandial:   less than 180 mg/dL (1-2 hours)      Critically ill patients:  140 - 180 mg/dL    Latest Reference Range & Units 06/09/23 07:29 06/09/23 11:37 06/09/23 17:00 06/09/23 19:26 06/09/23 23:37 06/10/23 04:02 06/10/23 08:18  Glucose-Capillary 70 - 99 mg/dL 841 (H) 324 (H) 401 (H) 399 (H) 175 (H) 109 (H) 138 (H)   Review of Glycemic Control  Diabetes history: DM1 Outpatient Diabetes medications: T-Slim insulin pump with Humalog (total basal insulin 50.775 units/day; I:CR 1:3-4 grams, I:SF 1:30 mg/dl) Current orders for Inpatient glycemic control: Semglee 50 every day, Novolog 15 units TID with meals, Novolog 0-9 units TID with meals  Inpatient Diabetes Program Recommendations:    Insulin: Please consider increasing meal coverage to Novolog 18 units TID with meals and adding Novolog 0-5 units at bedtime for bedtime correction.  Thanks, Orlando Penner, RN, MSN, CDCES Diabetes Coordinator Inpatient Diabetes Program [phone] (Team Pager from 8am to 5pm)

## 2023-06-10 NOTE — Evaluation (Signed)
Physical Therapy Evaluation Patient Details Name: Carl Yang MRN: [id number] DOB: 20-Oct-1940 Today's Date: 06/10/2023  History of Present Illness  82 y/o male presented to ED from Dr. Irene Limbo office on 06/04/23 for L heel wound. S/p L foot I&D on 9/20. PMH: Afib on Eliquis, CAD, T2DM, GERD, CKD stage III, OSA on CPAP  Clinical Impression  Patient admitted with the above. PTA, patient lives at [facility] and reports independence with mobility, although recently utilizing cane due to L heel wound. Patient is currently NWB on LLE and provided education on impact of mobility. Patient required modA to stand from elevated surface with PT foot under patient's to ensure NWB. Unable to pivot or hop on R LE with B UE on RW. Able to complete lateral scoot on EOB with CGA with ability to maintain NWB. Patient having difficulty fully understanding NWB despite max education. Patient will benefit from skilled PT services during acute stay to address listed deficits. Patient will benefit from ongoing therapy at discharge to maximize functional independence and safety.         If plan is discharge home, recommend the following: A little help with walking and/or transfers;A little help with bathing/dressing/bathroom;Assistance with cooking/housework;Assist for transportation   Can travel by private vehicle   No    Equipment Recommendations Wheelchair (measurements PT);Wheelchair cushion (measurements PT);BSC/3in1  Recommendations for Other Services  OT consult    Functional Status Assessment Patient has had a recent decline in their functional status and demonstrates the ability to make significant improvements in function in a reasonable and predictable amount of time.     Precautions / Restrictions Precautions Precautions: Fall Restrictions Weight Bearing Restrictions: Yes LLE Weight Bearing: Non weight bearing      Mobility  Bed Mobility Overal bed mobility: Modified  Independent                  Transfers Overall transfer level: Needs assistance Equipment used: Rolling Dyanne [employer]orks (2 wheels) Transfers: Sit to/from Stand, Bed to chair/wheelchair/BSC Sit to Stand: Mod assist, From elevated surface          Lateral/Scoot Transfers: Contact guard assist General transfer comment: cues for maintaining NWB on L LE throughout with PT's foot placed under patient's foot to ensure NWB with standing. Required cues for hand placement multiple times and required modA to come into standing from EOB. Unable to pivot or hop on R LE. Able to demonstrate lateral scoot on EOB with CGA and good adherence to NWB on LLE    Ambulation/Gait                  Stairs            Wheelchair Mobility     Tilt Bed    Modified Rankin (Stroke Patients Only)       Balance Overall balance assessment: Needs assistance Sitting-balance support: No upper extremity supported, Feet supported Sitting balance-Leahy Scale: Good     Standing balance support: Bilateral upper extremity supported, Reliant on assistive device for balance Standing balance-Leahy Scale: Fair                               Pertinent Vitals/Pain Pain Assessment Pain Assessment: Faces Faces Pain Scale: No hurt Pain Intervention(s): Monitored during session    Home Living Family/patient expects to be discharged to:: Private residence Living Arrangements: Alone   Type of Home: Independent living facility Home Access: Level  entry       Home Layout: One level Home Equipment: Cane - single point;Other (comment) (knee scooter) Additional Comments: lives at [facility]    Prior Function Prior Level of Function : Independent/Modified Independent             Mobility Comments: recently has been using cane due to heel wound       Extremity/Trunk Assessment   Upper Extremity Assessment Upper Extremity Assessment: Overall WFL for tasks assessed     Lower Extremity Assessment Lower Extremity Assessment: Generalized weakness       Communication   Communication Communication: No apparent difficulties  Cognition Arousal: Alert Behavior During Therapy: WFL for tasks assessed/performed Overall Cognitive Status: Within Functional Limits for tasks assessed                                          General Comments      Exercises     Assessment/Plan    PT Assessment Patient needs continued PT services  PT Problem List Decreased strength;Decreased activity tolerance;Decreased balance;Decreased mobility;Decreased knowledge of precautions       PT Treatment Interventions DME instruction;Gait training;Functional mobility training;Therapeutic activities;Therapeutic exercise;Balance training;Patient/family education    PT Goals (Current goals can be found in the Care Plan section)  Acute Rehab PT Goals Patient Stated Goal: to get rehab prior to returning home PT Goal Formulation: With patient Time For Goal Achievement: 06/24/23 Potential to Achieve Goals: Good    Frequency Min 1X/week     Co-evaluation               AM-PAC PT "6 Clicks" Mobility  Outcome Measure Help needed turning from your back to your side while in a flat bed without using bedrails?: None Help needed moving from lying on your back to sitting on the side of a flat bed without using bedrails?: None Help needed moving to and from a bed to a chair (including a wheelchair)?: A Little Help needed standing up from a chair using your arms (e.g., wheelchair or bedside chair)?: A Lot Help needed to walk in hospital room?: Total Help needed climbing 3-5 steps with a railing? : Total 6 Click Score: 15    End of Session Equipment Utilized During Treatment: Gait belt Activity Tolerance: Patient tolerated treatment well Patient left: in bed;with call bell/phone within reach;with bed alarm set Nurse Communication: Mobility status PT Visit  Diagnosis: Unsteadiness on feet (R26.81);Muscle weakness (generalized) (M62.81);Other abnormalities of gait and mobility (R26.89)    Time: 1040-1109 PT Time Calculation (min) (ACUTE ONLY): 29 min   Charges:   PT Evaluation $PT Eval Moderate Complexity: 1 Mod PT Treatments $Therapeutic Activity: 8-22 mins PT General Charges $$ ACUTE PT VISIT: 1 Visit         Maylon Peppers, PT, DPT Physical Therapist - [facility]  [facility]   Vienna Folden A Waunita Sandstrom 06/10/2023, 1:02 PM

## 2023-06-10 NOTE — Consult Note (Signed)
Pharmacy Antibiotic Note  Carl Yang is a 82 y.o. male admitted on 06/04/2023 with  foot wound . PMH significant for arthritis, PVD, T1DM, OSA on CPAP, anemia, CKD3, CAD, AF (on Eliquis), HLD. He has recently been on doxycycline x10 days for this infection. He was sent from the office to the emergency department for admission and IV antibiotics. Pharmacy has been consulted for vancomycin, cefepime dosing.  Plan: Day 6 of antibiotics 1) continue vancomycin 1500 mg IV every 24 hours Expected AUC: 432.2. Expected Css min: 12.4 SCr used: 1.27 (~baseline) Weight used: IBW, Vd used: 0.72 (BMI 26.2) Will obtain a vancomycin trough prior to today's dose 2) continue cefepime 2 g IV Q12H Continue to monitor renal function and follow culture results   Height: 6\' 3"  (190.5 cm) Weight: 95.6 kg (210 lb 12.2 oz) IBW/kg (Calculated) : 84.5  Temp (24hrs), Avg:97.8 F (36.6 C), Min:97.1 F (36.2 C), Max:98.2 F (36.8 C)  Recent Labs  Lab 06/04/23 1534 06/04/23 1711 06/05/23 0641 06/06/23 0336 06/07/23 0636 06/08/23 0352 06/10/23 0454  WBC 10.2  --  7.6  --  7.8  --  8.9  CREATININE 1.66*  --  1.47* 1.46* 1.50* 1.32* 1.27*  LATICACIDVEN 2.4* 1.6  --   --   --   --   --     Estimated Creatinine Clearance: 55.4 mL/min (A) (by C-G formula based on SCr of 1.27 mg/dL (H)).    No Known Allergies  Antimicrobials this admission: 9/17 vancomycin >>  9/17 cefepime >>   Microbiology results: 9/17 BCx: NG final 9/20 bone Cx: S epidermidis (pending) 9/17 Wound Cx: P aeruginosa, K pneumoniae, S mitis   Thank you for allowing pharmacy to be a part of this patient's care.  Lowella Bandy, PharmD Clinical Pharmacist 06/10/2023 7:05 AM

## 2023-06-11 DIAGNOSIS — I825Y1 Chronic embolism and thrombosis of unspecified deep veins of right proximal lower extremity: Secondary | ICD-10-CM | POA: Diagnosis not present

## 2023-06-11 DIAGNOSIS — I480 Paroxysmal atrial fibrillation: Secondary | ICD-10-CM | POA: Diagnosis not present

## 2023-06-11 DIAGNOSIS — N1832 Chronic kidney disease, stage 3b: Secondary | ICD-10-CM

## 2023-06-11 DIAGNOSIS — D638 Anemia in other chronic diseases classified elsewhere: Secondary | ICD-10-CM | POA: Diagnosis not present

## 2023-06-11 DIAGNOSIS — L97424 Non-pressure chronic ulcer of left heel and midfoot with necrosis of bone: Secondary | ICD-10-CM | POA: Diagnosis not present

## 2023-06-11 LAB — GLUCOSE, CAPILLARY
Glucose-Capillary: 112 mg/dL — ABNORMAL HIGH (ref 70–99)
Glucose-Capillary: 98 mg/dL (ref 70–99)
Glucose-Capillary: 285 mg/dL — ABNORMAL HIGH (ref 70–99)
Glucose-Capillary: 294 mg/dL — ABNORMAL HIGH (ref 70–99)
Glucose-Capillary: 265 mg/dL — ABNORMAL HIGH (ref 70–99)

## 2023-06-11 MED ORDER — INSULIN GLARGINE-YFGN 100 UNIT/ML ~~LOC~~ SOLN
48.0000 [IU] | Freq: Every day | SUBCUTANEOUS | Status: DC
  Administered 2023-06-11 – 2023-06-12 (×2): 48 [IU] via SUBCUTANEOUS
  Filled 2023-06-11 (×2): qty 0.48

## 2023-06-11 MED ORDER — INSULIN ASPART 100 UNIT/ML IJ SOLN: 18.0000 [IU] | Freq: Three times a day (TID) | INTRAMUSCULAR | Status: DC

## 2023-06-11 MED ORDER — INSULIN ASPART 100 UNIT/ML IJ SOLN
18.0000 [IU] | Freq: Once | INTRAMUSCULAR | Status: AC
  Administered 2023-06-11: 18 [IU] via SUBCUTANEOUS
  Filled 2023-06-11: qty 1

## 2023-06-11 MED ORDER — AMOXICILLIN-POT CLAVULANATE 875-125 MG PO TABS: 1.0000 | ORAL_TABLET | Freq: Two times a day (BID) | ORAL | 0 refills | Status: AC

## 2023-06-11 MED ORDER — INSULIN ASPART 100 UNIT/ML IJ SOLN
18.0000 [IU] | Freq: Three times a day (TID) | INTRAMUSCULAR | Status: DC
  Administered 2023-06-11 – 2023-06-12 (×3): 18 [IU] via SUBCUTANEOUS
  Filled 2023-06-11 (×4): qty 1

## 2023-06-11 MED ORDER — CIPROFLOXACIN HCL 500 MG PO TABS: 500.0000 mg | ORAL_TABLET | Freq: Two times a day (BID) | ORAL | 0 refills | Status: AC

## 2023-06-11 NOTE — Discharge Summary (Signed)
Physician Discharge Summary   Patient: Carl Yang MRN: [id number] DOB: 1941-03-08  Admit date:     06/04/2023  Discharge date: 06/11/23  Discharge Physician: Marcelino Duster   PCP: Marisue Ivan, MD   Recommendations at discharge:    PCP follow up in 1 week. Podiatry follow up as scheduled.  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 *  Hospital Course: Carl Yang is a 82 y.o. male with medical history significant for a.fib on Eliquis , cad, GERD, DM II, coming with left foot ulcer.  It has progressively gotten worse with drainage and foul odor since 10 days. No fever, pain. Pt is on insulin pump. Blood work shows mild hyponatremia of 132. Mild AKI with cr of 1.66 ands initial lactic of 2.4 which has resolved to 1.6.  Podiatry and vascular team consulted.   He is admitted to hospitalist service for further management evaluation. Vascular team performed aortogram, left lower extremity showed mild atherosclerosis but no occlusive lesions. Podiarty team performed I&D 06/07/23. He tolerated the procedure well.  Vancomycin, cefepime changed to Zosyn per wound cultures growing Pseudomonas.  He is resumed on Eliquis therapy.  Patient's blood sugars closely monitored, insulin adjusted accordingly.  Podiatry followed him advised Augmentin, Cipro therapy, outpatient follow-up.  PT OT suggested SNF placement.  He is hemodynamically stable to be discharged to skilled nursing  facility.   Assessment and Plan: * Ulcer of left heel (HCC) Diabetic foot infection Aortogram showed no hemodynamic significant lesions. S/p debridement 06/07/23, need podiatry follow-up as outpatient.. Surgical pathology negative for osteomyelitis.  Wound cultures grew Klebsiella, Pseudomonas, Streptococcus and staph. Zosyn therapy changed to Augmentin and Cipro oral prior to discharge. Continue dressing changes. NWB left lower extremity. Dressing changes per podiatry.  PT OT recommended SNF. He is being discharged to SNF.   Atrial fibrillation (HCC) HR stable. Resumed Eliquis.   Type 1 diabetes mellitus (HCC) Blood sugars improved. Got Lantus 50 units, pre meal 15 units. Carb consistent diet. Resumed insulin pump upon discharge   History of coronary artery stent placement Stable no c/o chest pain.  Cont statin, Eliquis   CKD (chronic kidney disease) stage 3, GFR 30-59 ml/min (HCC) Stable. Avoid nephrotoxic drugs. Monitor renal function outpatient.  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   GERD without esophagitis Continue oral PPI.    OSA on CPAP Home CPAP at night.        Consultants: Podiatry Procedures performed: I&D left heel  Disposition: Skilled nursing facility Diet recommendation:  Discharge Diet Orders (From admission, onward)     Start     Ordered   06/11/23 0000  Diet - low sodium heart healthy        06/11/23 1343           Cardiac and Carb modified diet DISCHARGE MEDICATION: Allergies as of 06/11/2023   No Known Allergies      Medication  List     STOP taking these medications    atorvastatin 80 MG tablet Commonly known as: LIPITOR   enoxaparin 40 MG/0.4ML injection Commonly known as: LOVENOX   warfarin 3 MG tablet Commonly known as: COUMADIN   warfarin 4 MG tablet Commonly known as: COUMADIN       TAKE these medications    amoxicillin-clavulanate 875-125 MG  tablet Commonly known as: AUGMENTIN Take 1 tablet by mouth 2 (two) times daily for 7 days.   Baqsimi One Pack 3 MG/DOSE Powd Generic drug: Glucagon One spray in one nostril in case of severe hypoglycemia   Besivance 0.6 % Susp Generic drug: Besifloxacin HCl Apply to eye.   ciprofloxacin 500 MG tablet Commonly known as: CIPRO Take 1 tablet (500 mg total) by mouth 2 (two) times daily for 7 days.   docusate sodium 100 MG capsule Commonly known as: COLACE Take 100 mg by mouth daily.   Eliquis 2.5 MG Tabs tablet Generic drug: apixaban Take 2.5 mg by mouth 2 (two) times daily.   esomeprazole 40 MG capsule Commonly known as: NEXIUM Take 1 capsule (40 mg total) by mouth daily. What changed: when to take this   fexofenadine 180 MG tablet Commonly known as: ALLEGRA Take 180 mg by mouth every morning.   HumaLOG 100 UNIT/ML injection Generic drug: insulin lispro Inject 1.5 mLs (150 Units total) into the skin continuous. Via pump   metFORMIN 500 MG tablet Commonly known as: GLUCOPHAGE Take 500 mg by mouth 2 (two) times daily.   multivitamin with minerals Tabs tablet Take 1 tablet by mouth daily. Centrum Silver   ramipril 2.5 MG capsule Commonly known as: ALTACE Take 1 capsule (2.5 mg total) by mouth daily. What changed: when to take this   rosuvastatin 40 MG tablet Commonly known as: CRESTOR Take 40 mg by mouth daily.               Discharge Care Instructions  (From admission, onward)           Start     Ordered   06/11/23 0000  Discharge wound care:       Comments: Per podiatry   06/11/23 1343            Discharge Exam: Filed Weights   06/08/23 0251 06/10/23 0400 06/11/23 0330  Weight: 97.3 kg 95.6 kg 94.8 kg   General - Elderly Caucasian male, no apparent distress HEENT - PERRLA, EOMI, atraumatic head, non tender sinuses. Lung - Clear, rales, rhonchi, wheezes. Heart - S1, S2 heard, no murmurs, rubs, trace pedal edema Neuro - Alert, awake  and oriented x 3, non focal exam. Skin - Warm and dry.  Left heel ulcer with dressing noted  Condition at discharge: stable  The results of significant diagnostics from this hospitalization (including imaging, microbiology, ancillary and laboratory) are listed below for reference.   Imaging Studies: DG Foot Complete Right  Result Date: 06/10/2023 CLINICAL DATA:  [id number] Osteomyelitis of ankle or foot, acute, left (HCC) 109323 EXAM: RIGHT FOOT COMPLETE - 3+ VIEW COMPARISON:  None Available. 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 Rounded hyperattenuating structures within the soft tissues at the plantar aspect of the hindfoot adjacent to the posterior calcaneus, likely antibiotic beads. No definite site of bone erosion. Mild soft tissue swelling. Advanced vascular calcifications. Postsurgical changes to the plantar heel. IMPRESSION: Rounded hyperattenuating structures within  the soft tissues at the plantar aspect of the hindfoot adjacent to the posterior calcaneus, likely antibiotic beads. No definite site of bone erosion. Electronically Signed   By: Duanne Guess D.O.   On: 06/10/2023 18:59   DG MINI C-ARM IMAGE ONLY  Result Date: 06/07/2023 There is no interpretation for this exam.  This order is for images obtained during a surgical procedure.  Please See "Surgeries" Tab for more information regarding the procedure.   PERIPHERAL VASCULAR CATHETERIZATION  Result Date: 06/06/2023 See surgical note for result.  MR ANKLE LEFT WO CONTRAST  Result Date: 06/05/2023 CLINICAL DATA:  Diabetic left heel wound. EXAM: MRI OF THE LEFT ANKLE WITHOUT CONTRAST TECHNIQUE: Multiplanar, multisequence MR imaging of the ankle was performed. No intravenous contrast was administered. COMPARISON:  Left foot x-rays from same day. FINDINGS: TENDONS Peroneal: Peroneal longus tendon intact.  Peroneal brevis intact. Posteromedial: Posterior tibial tendon intact. Flexor digitorum longus tendon intact. Flexor hallucis longus tendon intact. Anterior: Tibialis anterior tendon intact. Extensor hallucis longus tendon intact. Extensor digitorum longus tendon intact. Achilles:  Intact. Mild distal tendinosis. Plantar Fascia: Intact. Thickened proximal central band. LIGAMENTS Lateral: Anterior talofibular ligament intact. Calcaneofibular ligament intact. Posterior talofibular ligament intact. Anterior and posterior tibiofibular ligaments intact. Medial: Deltoid ligament intact. Spring ligament intact. CARTILAGE Ankle Joint: No joint effusion. Normal ankle mortise. Mild degenerative changes. Tiny focus of marrow edema in the lateral talar dome with overlying cartilage fissuring. Subtalar Joints/Sinus Tarsi: Mild degenerative changes of the subtalar joints. No subtalar joint effusion. Normal sinus tarsi. Bones: Abnormal marrow edema with corresponding decreased T1 marrow signal involving plantar aspect of the calcaneal tuberosity. Remaining marrow signal is normal. 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 No fracture or dislocation. Soft Tissue: Superficial ulceration along the plantar aspect of the heel. No fluid collection. Diffuse soft tissue swelling. IMPRESSION: 1. Superficial ulceration along the plantar aspect of the heel with underlying osteomyelitis of the plantar calcaneal tuberosity. No abscess. Electronically Signed   By: Obie Dredge M.D.   On: 06/05/2023 07:57   DG Foot Complete Left  Result Date: 06/04/2023 CLINICAL DATA:  Cellulitis.  Draining wound. EXAM: LEFT FOOT - COMPLETE 3 VIEW COMPARISON:  None Available. 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 Underlying osteopenia. Degenerative changes of the midfoot. Plantar calcaneal spur. Vascular calcifications. Soft tissue swelling about  the foot greatest distally. There is also some loss of roundness to the head of second metatarsal. Please correlate for any history such as AVN. No definite erosive changes identified at this time. Please correlate for exact location of open wound. There is some lucency in the soft tissues plantar to the calcaneus posteriorly. Please correlate for soft tissue gas. If there is further concern of bone infection, recommend follow up MRI or bone scan for higher sensitivity. IMPRESSION: No definite erosive changes at this time suggest acute bone infection. Osteopenia with soft tissue swelling and degenerative changes. Previous multifocal amputation. Focal soft tissue gas along the plantar aspect of the foot the level of the calcaneus. Electronically Signed   By: Karen Kays M.D.   On: 06/04/2023 18:23    Microbiology: Results for orders placed or performed during the hospital encounter of 06/04/23  Aerobic/Anaerobic Culture w Gram Stain (surgical/deep wound)     Status: None   Collection Time: 06/04/23  3:34 PM   Specimen: Wound  Result Value Ref Range Status   Specimen Description WOUND  Final  Special Requests NONE  Final   Gram Stain   Final    FEW GRAM POSITIVE COCCI IN CLUSTERS IN SINGLES FEW GRAM VARIABLE ROD NO WBC SEEN    Culture   Final    MODERATE KLEBSIELLA PNEUMONIAE ABUNDANT PSEUDOMONAS AERUGINOSA FEW STREPTOCOCCUS MITIS/ORALIS RARE BACTEROIDES SPECIES NOT FRAGILIS BETA LACTAMASE POSITIVE    Report Status 06/09/2023 FINAL  Final   Organism ID, Bacteria KLEBSIELLA PNEUMONIAE  Final   Organism ID, Bacteria PSEUDOMONAS AERUGINOSA  Final   Organism ID, Bacteria STREPTOCOCCUS MITIS/ORALIS  Final      Susceptibility   Klebsiella pneumoniae - MIC*    AMPICILLIN >=32 RESISTANT Resistant     CEFEPIME <=0.12 SENSITIVE Sensitive     CEFTAZIDIME <=1 SENSITIVE Sensitive     CEFTRIAXONE <=0.25 SENSITIVE Sensitive     CIPROFLOXACIN 1 RESISTANT Resistant     GENTAMICIN <=1 SENSITIVE  Sensitive     IMIPENEM 0.5 SENSITIVE Sensitive     TRIMETH/SULFA <=20 SENSITIVE Sensitive     AMPICILLIN/SULBACTAM 8 SENSITIVE Sensitive     PIP/TAZO 8 SENSITIVE Sensitive     * MODERATE KLEBSIELLA PNEUMONIAE   Pseudomonas aeruginosa - MIC*    CEFTAZIDIME 4 SENSITIVE Sensitive     CIPROFLOXACIN 0.5 SENSITIVE Sensitive     GENTAMICIN <=1 SENSITIVE Sensitive     IMIPENEM 1 SENSITIVE Sensitive     PIP/TAZO 8 SENSITIVE Sensitive     CEFEPIME 2 SENSITIVE Sensitive     * ABUNDANT PSEUDOMONAS AERUGINOSA   Streptococcus mitis/oralis - MIC*    TETRACYCLINE >=16 RESISTANT Resistant     VANCOMYCIN 0.5 SENSITIVE Sensitive     CLINDAMYCIN >=1 RESISTANT Resistant     PENICILLIN Value in next row Sensitive      SENSITIVE0.12    CEFTRIAXONE Value in next row Sensitive      SENSITIVE0.25Performed at [facility], [address], [place], [place] [address]    * FEW STREPTOCOCCUS MITIS/ORALIS  Culture, blood (routine x 2)     Status: None   Collection Time: 06/04/23  3:41 PM   Specimen: BLOOD  Result Value Ref Range Status   Specimen Description BLOOD BLOOD LEFT ARM  Final   Special Requests   Final    BOTTLES DRAWN AEROBIC AND ANAEROBIC Blood Culture results may not be optimal due to an inadequate volume of blood received in culture bottles   Culture   Final    NO GROWTH 5 DAYS Performed at [facility], [address], [place], [place] [address]    Report Status 06/09/2023 FINAL  Final  Culture, blood (routine x 2)     Status: None   Collection Time: 06/04/23  5:11 PM   Specimen: BLOOD  Result Value Ref Range Status   Specimen Description BLOOD BLOOD RIGHT ARM  Final   Special Requests   Final    BOTTLES DRAWN AEROBIC AND ANAEROBIC Blood Culture results may not be optimal due to an excessive volume of blood received in culture bottles   Culture   Final    NO GROWTH 5 DAYS Performed at [facility], [address], [place], [place] [address]    Report Status  06/09/2023 FINAL  Final  Aerobic/Anaerobic Culture w Gram Stain (surgical/deep wound)     Status: None (Preliminary result)   Collection Time: 06/07/23 12:38 PM   Specimen: PATH Bone resection; Tissue  Result Value Ref Range Status   Specimen Description   Final    TISSUE Performed at [facility],  [address] [place], [place] [address]    Special Requests   Final    BONE RESECT OF LEFT HEEL. Performed at [facility], [address] [place], [place] [address]    Gram Stain   Final    NO WBC SEEN NO ORGANISMS SEEN Performed at [facility], [address] [place], [place] [address]    Culture   Final    RARE STAPHYLOCOCCUS EPIDERMIDIS NO ANAEROBES ISOLATED; CULTURE IN PROGRESS FOR 5 DAYS    Report Status PENDING  Incomplete   Organism ID, Bacteria STAPHYLOCOCCUS EPIDERMIDIS  Final      Susceptibility   Staphylococcus epidermidis - MIC*    CIPROFLOXACIN <=0.5 SENSITIVE Sensitive     ERYTHROMYCIN >=8 RESISTANT Resistant     GENTAMICIN <=0.5 SENSITIVE Sensitive     OXACILLIN >=4 RESISTANT Resistant     TETRACYCLINE >=16 RESISTANT Resistant     VANCOMYCIN 1 SENSITIVE Sensitive     TRIMETH/SULFA <=10 SENSITIVE Sensitive     CLINDAMYCIN <=0.25 SENSITIVE Sensitive     RIFAMPIN <=0.5 SENSITIVE Sensitive     Inducible Clindamycin NEGATIVE Sensitive     * RARE STAPHYLOCOCCUS EPIDERMIDIS    Labs: CBC: Recent Labs  Lab 06/04/23 1534 06/05/23 0641 06/07/23 0636 06/10/23 0454  WBC 10.2 7.6 7.8 8.9  NEUTROABS 8.2*  --   --   --   HGB 12.4* 11.8* 11.4* 11.5*  HCT 38.7* 34.9* 33.8* 34.2*  MCV 95.3 92.3 92.1 92.2  PLT PLATELET CLUMPS NOTED ON SMEAR, UNABLE TO ESTIMATE 175 196 210   Basic Metabolic Panel: Recent Labs  Lab 06/04/23 1534 06/05/23 0641 06/06/23 0336 06/06/23 2031 06/07/23 0636 06/08/23 0352 06/09/23 1207 06/10/23 0454  NA 132* 133*  --   --  135  --   --  135  K 5.1 5.0  --   --  4.4  --   --  3.7  CL 99 102  --   --  104   --   --  107  CO2 20* 24  --   --  23  --   --  21*  GLUCOSE 352* 385*  --  434* 238*  --  494* 120*  BUN 24* 25*  --   --  29*  --   --  23  CREATININE 1.66* 1.47* 1.46*  --  1.50* 1.32*  --  1.27*  CALCIUM 8.4* 8.4*  --   --  8.2*  --   --  8.4*   Liver Function Tests: Recent Labs  Lab 06/04/23 1534 06/05/23 0641 06/07/23 0636  AST 33 22 22  ALT 21 22 22   ALKPHOS 98 85 79  BILITOT 1.0 1.4* 0.7  PROT 6.9 6.2* 5.9*  ALBUMIN 3.2* 2.8* 2.5*   CBG: Recent Labs  Lab 06/10/23 1738 06/10/23 2102 06/11/23 0328 06/11/23 0755 06/11/23 1233  GLUCAP 285* 236* 112* 98 285*    Discharge time spent: 40 minutes.  Signed: Marcelino Duster, MD [facility] 06/11/2023

## 2023-06-11 NOTE — Progress Notes (Signed)
Physical Therapy Treatment Patient Details Name: Carl Yang MRN: [id number] DOB: 04-10-1941 Today's Date: 06/11/2023   History of Present Illness 82 y/o male presented to ED from Dr. Irene Limbo office on 06/04/23 for L heel wound. S/p L foot I&D on 9/20. PMH: Afib on Eliquis, CAD, T2DM, GERD, CKD stage III, OSA on CPAP    PT Comments  Patient reclined in bed on arrival and agreeable to PT session. Patient able to recall WB restrictions. Continues to demonstrate general confusion during session. Able to perform lateral scoot transfer towards the R with set up assistance and supervision. Good adherence to NWB on L with transfer. Discharge plan remains appropriate.     If plan is discharge home, recommend the following: A little help with walking and/or transfers;A little help with bathing/dressing/bathroom;Assistance with cooking/housework;Assist for transportation   Can travel by private vehicle     No  Equipment Recommendations  Wheelchair (measurements PT);Wheelchair cushion (measurements PT);BSC/3in1 (drop arm BSC)    Recommendations for Other Services       Precautions / Restrictions Precautions Precautions: Fall Restrictions Weight Bearing Restrictions: Yes LLE Weight Bearing: Non weight bearing     Mobility  Bed Mobility Overal bed mobility: Modified Independent                  Transfers Overall transfer level: Needs assistance Equipment used: None Transfers: Bed to chair/wheelchair/BSC            Lateral/Scoot Transfers: Supervision General transfer comment: able to transfer to drop arm recliner on R with supervision. Good buttocks clearance and good adherence to NWB on L    Ambulation/Gait                   Stairs             Wheelchair Mobility     Tilt Bed    Modified Rankin (Stroke Patients Only)       Balance Overall balance assessment: Needs assistance Sitting-balance support: No upper extremity supported, Feet  supported Sitting balance-Leahy Scale: Good                                      Cognition Arousal: Alert Behavior During Therapy: WFL for tasks assessed/performed Overall Cognitive Status: Within Functional Limits for tasks assessed                                          Exercises      General Comments        Pertinent Vitals/Pain Pain Assessment Pain Assessment: No/denies pain    Home Living                          Prior Function            PT Goals (current goals can now be found in the care plan section) Acute Rehab PT Goals Patient Stated Goal: to get rehab prior to returning home PT Goal Formulation: With patient Time For Goal Achievement: 06/24/23 Potential to Achieve Goals: Good Progress towards PT goals: Progressing toward goals    Frequency    Min 1X/week      PT Plan      Co-evaluation  AM-PAC PT "6 Clicks" Mobility   Outcome Measure  Help needed turning from your back to your side while in a flat bed without using bedrails?: None Help needed moving from lying on your back to sitting on the side of a flat bed without using bedrails?: None Help needed moving to and from a bed to a chair (including a wheelchair)?: A Little Help needed standing up from a chair using your arms (e.g., wheelchair or bedside chair)?: A Lot Help needed to walk in hospital room?: Total Help needed climbing 3-5 steps with a railing? : Total 6 Click Score: 15    End of Session   Activity Tolerance: Patient tolerated treatment well Patient left: in chair;with call bell/phone within reach;with chair alarm set Nurse Communication: Mobility status PT Visit Diagnosis: Unsteadiness on feet (R26.81);Muscle weakness (generalized) (M62.81);Other abnormalities of gait and mobility (R26.89)     Time: 3762-8315 PT Time Calculation (min) (ACUTE ONLY): 20 min  Charges:    $Therapeutic Activity: 8-22 mins PT  General Charges $$ ACUTE PT VISIT: 1 Visit                     Maylon Peppers, PT, DPT Physical Therapist - [facility]  [facility]    Dillard Pascal A Donald Jacque 06/11/2023, 11:07 AM

## 2023-06-11 NOTE — Inpatient Diabetes Management (Signed)
Inpatient Diabetes Program Recommendations  AACE/ADA: New Consensus Statement on Inpatient Glycemic Control   Target Ranges:  Prepandial:   less than 140 mg/dL      Peak postprandial:   less than 180 mg/dL (1-2 hours)      Critically ill patients:  140 - 180 mg/dL    Latest Reference Range & Units 06/10/23 08:18 06/10/23 12:27 06/10/23 17:38 06/10/23 21:02 06/11/23 03:28 06/11/23 07:55  Glucose-Capillary 70 - 99 mg/dL 161 (H)  Novolog 16 units  Semglee 50 units 354 (H)  Novolog 24 units 285 (H)  Novolog 24 units 236 (H)  Novolog 2 units 112 (H) 98   Review of Glycemic Control  Diabetes history: DM1 Outpatient Diabetes medications: T-Slim insulin pump with Humalog (total basal insulin 50.775 units/day; I:CR 1:3-4 grams, I:SF 1:30 mg/dl) Current orders for Inpatient glycemic control: Semglee 50 every day, Novolog 15 units TID with meals, Novolog 0-9 units TID with meals, Novolog 0-5 units QHS   Inpatient Diabetes Program Recommendations:     Insulin: Please consider decreasing Semglee to 48 units daily and increasing meal coverage to Novolog 18 units TID with meals.   Thanks, Orlando Penner, RN, MSN, CDCES Diabetes Coordinator Inpatient Diabetes Program [phone] (Team Pager from 8am to 5pm)

## 2023-06-11 NOTE — Plan of Care (Signed)

## 2023-06-11 NOTE — Progress Notes (Signed)
Mobility Specialist - Progress Note   06/11/23 1620  Mobility  Activity Transferred to/from [facility]  Level of Assistance Minimal assist, patient does 75% or more  Assistive Device Front wheel walker  Distance Ambulated (ft) 2 ft  LLE Weight Bearing WBAT  Activity Response Tolerated well  $Mobility charge 1 Mobility  Mobility Specialist Start Time (ACUTE ONLY) 1600  Mobility Specialist Stop Time (ACUTE ONLY) 1614  Mobility Specialist Time Calculation (min) (ACUTE ONLY) 14 min   Pt seated in the recliner upon entry, utilizing RA. Pt requesting to use the [facility]. Pt scoot towards edge of seat indep, STS to RW Mod-MinA +2 for safety. Pt transferred to the [facility] via SPT-- vocal and tactile cueing required to remain NWB (inconstant), weight shift and RW navigation. Pt left seated on the [facility] with call bell within reach, NT notified.  Zetta Bills Mobility Specialist 06/11/23 4:50 PM

## 2023-06-12 DIAGNOSIS — L97422 Non-pressure chronic ulcer of left heel and midfoot with fat layer exposed: Secondary | ICD-10-CM | POA: Diagnosis not present

## 2023-06-12 DIAGNOSIS — E11621 Type 2 diabetes mellitus with foot ulcer: Secondary | ICD-10-CM | POA: Diagnosis present

## 2023-06-12 DIAGNOSIS — E10621 Type 1 diabetes mellitus with foot ulcer: Secondary | ICD-10-CM | POA: Diagnosis not present

## 2023-06-12 LAB — GLUCOSE, CAPILLARY
Glucose-Capillary: 199 mg/dL — ABNORMAL HIGH (ref 70–99)
Glucose-Capillary: 395 mg/dL — ABNORMAL HIGH (ref 70–99)

## 2023-06-12 MED ORDER — INSULIN ASPART 100 UNIT/ML IJ SOLN: 15.0000 [IU] | Freq: Three times a day (TID) | INTRAMUSCULAR | 11 refills | Status: DC

## 2023-06-12 MED ORDER — ACETAMINOPHEN 500 MG PO TABS: 500.0000 mg | ORAL_TABLET | Freq: Four times a day (QID) | ORAL | 0 refills | Status: DC | PRN

## 2023-06-12 MED ORDER — INSULIN GLARGINE-YFGN 100 UNIT/ML ~~LOC~~ SOLN: 40.0000 [IU] | Freq: Every day | SUBCUTANEOUS | 11 refills | Status: DC

## 2023-06-12 MED ORDER — INSULIN ASPART 100 UNIT/ML IJ SOLN
6.0000 [IU] | Freq: Once | INTRAMUSCULAR | Status: AC
  Administered 2023-06-12: 6 [IU] via SUBCUTANEOUS

## 2023-06-12 MED ORDER — HUMALOG 100 UNIT/ML ~~LOC~~ SOLN: 150.0000 [IU] | SUBCUTANEOUS | 0 refills | Status: DC

## 2023-06-12 NOTE — Progress Notes (Signed)
5 Days Post-Op   Subjective/Chief Complaint: Patient seen.  No complaints.  Preparing for discharge.   Objective: Vital signs in last 24 hours: Temp:  [97.7 F (36.5 C)-98 F (36.7 C)] 97.8 F (36.6 C) (09/25 0451) Pulse Rate:  [46-49] 47 (09/25 0451) Resp:  [18-20] 18 (09/25 0822) BP: (113-146)/(42-82) 141/64 (09/25 0822) SpO2:  [99 %-100 %] 100 % (09/25 0822) Last BM Date : 06/11/23  Intake/Output from previous day: 09/24 0701 - 09/25 0700 In: 120 [P.O.:120] Out: 950 [Urine:950] Intake/Output this shift: Total I/O In: -  Out: 450 [Urine:450]  Lateral incision is well coapted and dry with no drainage.  Mepitel and staples intact plantarly with some mild drainage on the bandaging.  Overall appears stable.     Lab Results:  Recent Labs    06/10/23 0454  WBC 8.9  HGB 11.5*  HCT 34.2*  PLT 210   BMET Recent Labs    06/10/23 0454  NA 135  K 3.7  CL 107  CO2 21*  GLUCOSE 120*  BUN 23  CREATININE 1.27*  CALCIUM 8.4*   PT/INR No results for input(s): "LABPROT", "INR" in the last 72 hours. ABG No results for input(s): "PHART", "HCO3" in the last 72 hours.  Invalid input(s): "PCO2", "PO2"  Studies/Results: DG Foot Complete Right  Result Date: 06/10/2023 CLINICAL DATA:  027253 Osteomyelitis of ankle or foot, acute, left (HCC) 664403 EXAM: RIGHT FOOT COMPLETE - 3+ VIEW COMPARISON:  None Available. 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 Rounded hyperattenuating structures within the soft tissues at the plantar aspect of the hindfoot adjacent to the posterior calcaneus, likely antibiotic beads. No definite site of bone erosion. Mild soft tissue swelling. Advanced vascular calcifications. Postsurgical changes to the plantar heel. IMPRESSION: Rounded hyperattenuating structures within the soft tissues at the plantar aspect of the hindfoot  adjacent to the posterior calcaneus, likely antibiotic beads. No definite site of bone erosion. Electronically Signed   By: Duanne Guess D.O.   On: 06/10/2023 18:59    Anti-infectives: Anti-infectives (From admission, onward)    Start     Dose/Rate Route Frequency Ordered Stop   06/11/23 0000  amoxicillin-clavulanate (AUGMENTIN) 875-125 MG tablet        1 tablet Oral 2 times daily 06/11/23 1343 06/18/23 2359   06/11/23 0000  ciprofloxacin (CIPRO) 500 MG tablet        500 mg Oral 2 times daily 06/11/23 1343 06/18/23 2359   06/10/23 1600  piperacillin-tazobactam (ZOSYN) IVPB 3.375 g        3.375 g 12.5 mL/hr over 240 Minutes Intravenous Every 8 hours 06/10/23 1421     06/06/23 0951  ceFAZolin (ANCEF) IVPB 2g/100 mL premix  Status:  Discontinued        2 g 200 mL/hr over 30 Minutes Intravenous 30 min pre-op 06/06/23 0951 06/06/23 0952   06/05/23 2113  ceFAZolin (ANCEF) IVPB 2g/100 mL premix  Status:  Discontinued        2 g 200 mL/hr over 30 Minutes Intravenous 30 min pre-op 06/05/23 2113 06/06/23 0951   06/05/23 2000  vancomycin (VANCOREADY) IVPB 1250 mg/250 mL  Status:  Discontinued        1,250 mg 166.7 mL/hr over 90 Minutes Intravenous Every 24 hours 06/04/23 1953 06/05/23 1126   06/05/23 2000  vancomycin (VANCOREADY) IVPB 1500 mg/300 mL  Status:  Discontinued  1,500 mg 150 mL/hr over 120 Minutes Intravenous Every 24 hours 06/05/23 1126 06/10/23 1419   06/04/23 2000  vancomycin (VANCOCIN) IVPB 1000 mg/200 mL premix        1,000 mg 200 mL/hr over 60 Minutes Intravenous  Once 06/04/23 1953 06/04/23 2341   06/04/23 2000  ceFEPIme (MAXIPIME) 2 g in sodium chloride 0.9 % 100 mL IVPB  Status:  Discontinued        2 g 200 mL/hr over 30 Minutes Intravenous Every 12 hours 06/04/23 1953 06/10/23 1419   06/04/23 1715  vancomycin (VANCOCIN) IVPB 1000 mg/200 mL premix        1,000 mg 200 mL/hr over 60 Minutes Intravenous  Once 06/04/23 1701 06/04/23 1832        Assessment/Plan: s/p Procedure(s): IRRIGATION AND DEBRIDEMENT BONE OF LEFT HEEL (Left) Assessment: Stable status post debridement osteomyelitis left heel  Plan: Betadine applied to the incision area followed by a sterile gauze dressing with ABD and Kerlix wrap.  Patient will have dressing changes every other day with a bulky dry sterile dressing.  Plan for follow-up outpatient in 1 week.  LOS: 8 days    Ricci Barker 06/12/2023

## 2023-06-12 NOTE — Progress Notes (Signed)
Progress Note   Patient: Carl Yang WUJ:[id number] DOB: November 03, 1940 DOA: 06/04/2023     8 DOS: the patient was seen and examined on 06/12/2023   Brief hospital course: Carl Yang is a 82 y.o. male with medical history significant for a.fib on Eliquis , cad, GERD, DM II, coming with left foot ulcer.  It has progressively gotten worse with drainage and foul odor since 10 days. No fever, pain. Pt is on insulin pump. Blood work shows mild hyponatremia of 132. Mild AKI with cr of 1.66 ands initial lactic of 2.4 which has resolved to 1.6.  Podiatry and vascular team consulted.  He is admitted to hospitalist service for further management evaluation. Vascular team performed aortogram, left lower extremity showed mild atherosclerosis but no occlusive lesions. Podiarty team performed I&D. Antibiotics changed per wound culture results. Sugars monitored and insulin adjusted accordingly. He is awaiting SNF placement, TOC informed that he has bed at the facility.  Discharge held due to no bowel movement. He can go to SNF tomorrow as he had a large bm this evening.  Assessment and Plan: * Ulcer of left heel (HCC) Diabetic foot infection Aortogram showed no hemodynamic significant lesions. S/p debridement 06/07/23, will follow podiatry recommendations. Surgical pathology negative for osteomyelitis.  Wound cultures grew Klebsiella, Pseudomonas, Streptococcus and staph. Zosyn therapy to be changed to Augmentin and Cipro oral prior to discharge. Continue dressing changes. NWB left lower extremity. Dressing changes per podiatry,  SNF placement available for him, can go tomorrow.  Atrial fibrillation (HCC) HR stable. Resumed Eliquis.  Type 1 diabetes mellitus (HCC) Blood sugars improved. Changed Lantus to 48 units, pre meal increased to 18 units Continue accucheks, sliding scale insulin with night coverage. Carb consistent diet. Resume insulin pump upon discharge  History of coronary artery  stent placement Stable no c/o chest pain.  Cont statin, Eliquis  CKD (chronic kidney disease) stage 3a, GFR 30-59 ml/min (HCC) Stable. Avoid nephrotoxic drugs. Monitor daily renal function.  Anemia of chronic disease Hemoglobin stable. No active bleeding.  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  GERD without esophagitis Continue PPI.   OSA - on CPAP   Hypertension associated with type 1 diabetes mellitus (HCC) Vitals:   06/04/23 1531 06/04/23 1654 06/04/23 1730 06/04/23 1800  BP: (!) 161/48 130/81 115/72 (!) 145/58   06/04/23 1830 06/04/23 1900 06/04/23 1930 06/04/23 2000  BP: (!) 155/67 (!) 130/91 (!) 148/62 (!) 141/56   06/04/23 2030 06/04/23 2100  BP: (!) 156/79 (!) 147/69  PRN hydralazine. Ramipril to be resumed at dc         Subjective: Patient is seen and examined today morning. He is lying in bed. Did not have bowel movement for a week and so discharge held. He got lactulose and around 5 pm he had a large bowel movement.  Physical Exam: Vitals:   06/11/23 0756 06/11/23 1555 06/11/23 2041 06/12/23 0451  BP: (!) 133/59 113/82 (!) 125/42 (!) 146/70  Pulse: 64 (!) 49 (!) 46 (!) 47  Resp: 18 18 20 20   Temp: 98.4 F (36.9 C) 97.7 F (36.5 C) 98 F (36.7 C) 97.8 F (36.6 C)  TempSrc: Oral Oral    SpO2: 98% 100% 99% 99%  Weight:      Height:       General - Elderly Caucasian male, no apparent distress HEENT - PERRLA, EOMI, atraumatic head, non tender sinuses. Lung - Clear, rales, rhonchi, wheezes. Heart -  S1, S2 heard, no murmurs, rubs, trace pedal edema Neuro - Alert, awake and oriented x 3, non focal exam. Skin - Warm and dry.  Left heel ulcer with dressing noted Data Reviewed:     Latest Ref Rng & Units 06/10/2023    4:54 AM 06/07/2023    6:36 AM 06/05/2023    6:41 AM  CBC  WBC 4.0 - 10.5 K/uL 8.9  7.8  7.6   Hemoglobin 13.0 - 17.0 g/dL 19.1  47.8  29.5   Hematocrit 39.0 - 52.0 % 34.2   33.8  34.9   Platelets 150 - 400 K/uL 210  196  175       Latest Ref Rng & Units 06/10/2023    4:54 AM 06/09/2023   12:07 PM 06/08/2023    3:52 AM  BMP  Glucose 70 - 99 mg/dL 621  308    BUN 8 - 23 mg/dL 23     Creatinine 6.57 - 1.24 mg/dL 8.46   9.62   Sodium 952 - 145 mmol/L 135     Potassium 3.5 - 5.1 mmol/L 3.7     Chloride 98 - 111 mmol/L 107     CO2 22 - 32 mmol/L 21     Calcium 8.9 - 10.3 mg/dL 8.4      DG Foot Complete Right  Result Date: 06/10/2023 CLINICAL DATA:  [id number] Osteomyelitis of ankle or foot, acute, left (HCC) [id number] EXAM: RIGHT FOOT COMPLETE - 3+ VIEW COMPARISON:  None Available. 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 Rounded hyperattenuating structures within the soft tissues at the plantar aspect of the hindfoot adjacent to the posterior calcaneus, likely antibiotic beads. No definite site of bone erosion. Mild soft tissue swelling. Advanced vascular calcifications. Postsurgical changes to the plantar heel. IMPRESSION: Rounded hyperattenuating structures within the soft tissues at the plantar aspect of the hindfoot adjacent to the posterior calcaneus, likely antibiotic beads. No definite site of bone erosion. Electronically Signed   By: Duanne Guess D.O.   On: 06/10/2023 18:59     Family Communication: Patient lives alone, understands and agrees with current plan.  Disposition: Status is: Inpatient Remains inpatient appropriate because: post I&D, no bowel movement.  Planned Discharge Destination: Skilled nursing facility tomorrow.    Time spent: 39 minutes  Author: Marcelino Duster, MD 06/12/2023 8:07 AM  For on call review [email or web address].

## 2023-06-12 NOTE — Discharge Summary (Signed)
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 *  Hospital Course: Carl Yang is a 82 y.o. male with medical history significant for a.fib on Eliquis , cad, GERD, DM II, coming with left foot ulcer.  It has progressively gotten worse with drainage and foul odor since 10 days. No fever, pain. Pt is on insulin pump. Blood work shows mild hyponatremia of 132. Mild AKI with cr of 1.66 ands initial lactic of 2.4 which has resolved to 1.6.  Podiatry and vascular team consulted.   He is admitted to hospitalist service for further management evaluation. Vascular team performed aortogram, left lower extremity showed mild atherosclerosis but no occlusive lesions. Podiarty team performed I&D 06/07/23. He tolerated the procedure well.  Vancomycin, cefepime changed to Zosyn per wound cultures growing Pseudomonas.  He is resumed on Eliquis therapy.  Patient's blood sugars closely monitored, insulin adjusted accordingly.  Podiatry followed him advised Augmentin, Cipro therapy, outpatient follow-up.  PT OT suggested SNF placement.  He is hemodynamically stable to be discharged to skilled nursing facility.      Assessment and Plan:  * Ulcer of  left heel (HCC) Diabetic foot infection Aortogram showed no hemodynamic significant lesions. S/p debridement 06/07/23, need podiatry follow-up as outpatient in 1 week Surgical pathology negative for osteomyelitis.  Wound cultures grew Klebsiella, Pseudomonas, Streptococcus and staph. Zosyn therapy changed to Augmentin and Cipro oral prior to discharge. Continue dressing changes. NWB left lower extremity. Dressing changes per podiatry.  PT OT recommended SNF. He is being discharged to SNF.   Atrial fibrillation (HCC) HR stable. Resumed Eliquis.   Type 1 diabetes mellitus (HCC) Continue carb consistent diet. Resumed insulin pump upon discharge   History of coronary artery stent placement Stable no c/o chest pain.  Cont statin, Eliquis   CKD (chronic kidney disease) stage 3, GFR 30-59 ml/min (HCC) Stable. Avoid nephrotoxic drugs. Monitor renal function outpatient.   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   GERD without esophagitis Continue oral PPI.    OSA on CPAP Home CPAP at night.                Consultants: Podiatry Procedures performed: Wound debridement, bone biopsy Disposition: SNF Diet recommendation:  Discharge Diet Orders (From admission, onward)     Start     Ordered   06/12/23 0000  Diet - low sodium heart healthy        06/12/23 1100   06/12/23 0000  Diet Carb Modified        06/12/23 1100   06/11/23 0000  Diet - low sodium heart healthy        06/11/23 1343  Cardiac and Carb modified diet DISCHARGE MEDICATION: Allergies as of 06/12/2023   No Known Allergies      Medication List     STOP taking these medications    atorvastatin 80 MG tablet Commonly known as: LIPITOR   enoxaparin 40 MG/0.4ML injection Commonly known as: LOVENOX   warfarin 3 MG tablet Commonly known as: COUMADIN   warfarin 4 MG tablet Commonly known as: COUMADIN       TAKE these medications     acetaminophen 500 MG tablet Commonly known as: TYLENOL Take 1 tablet (500 mg total) by mouth every 6 (six) hours as needed.   amoxicillin-clavulanate 875-125 MG tablet Commonly known as: AUGMENTIN Take 1 tablet by mouth 2 (two) times daily for 7 days.   Baqsimi One Pack 3 MG/DOSE Powd Generic drug: Glucagon One spray in one nostril in case of severe hypoglycemia   Besivance 0.6 % Susp Generic drug: Besifloxacin HCl Apply to eye.   ciprofloxacin 500 MG tablet Commonly known as: CIPRO Take 1 tablet (500 mg total) by mouth 2 (two) times daily for 7 days.   docusate sodium 100 MG capsule Commonly known as: COLACE Take 100 mg by mouth daily.   Eliquis 2.5 MG Tabs tablet Generic drug: apixaban Take 2.5 mg by mouth 2 (two) times daily.   esomeprazole 40 MG capsule Commonly known as: NEXIUM Take 1 capsule (40 mg total) by mouth daily. What changed: when to take this   fexofenadine 180 MG tablet Commonly known as: ALLEGRA Take 180 mg by mouth every morning.   HumaLOG 100 UNIT/ML injection Generic drug: insulin lispro Inject 1.5 mLs (150 Units total) into the skin continuous. Via pump   metFORMIN 500 MG tablet Commonly known as: GLUCOPHAGE Take 500 mg by mouth 2 (two) times daily.   multivitamin with minerals Tabs tablet Take 1 tablet by mouth daily. Centrum Silver   ramipril 2.5 MG capsule Commonly known as: ALTACE Take 1 capsule (2.5 mg total) by mouth daily. What changed: when to take this   rosuvastatin 40 MG tablet Commonly known as: CRESTOR Take 40 mg by mouth daily.               Discharge Care Instructions  (From admission, onward)           Start     Ordered   06/12/23 0000  Discharge wound care:       Comments: Patient will need dressing changes 3 times a week upon discharge. Betadine applied to the incision followed by a bulky sterile bandage   06/12/23 1100   06/11/23 0000  Discharge wound care:       Comments: Per podiatry    06/11/23 1343            Contact information for follow-up providers     Linus Galas, DPM. Go on 06/20/2023.   Specialty: Podiatry Why: Go at 10:00am. Contact information: [address] [place] [address] [phone]              Contact information for after-discharge care     Destination     [facility] .   Service: Skilled Nursing Contact information: [address] [place] [address] [phone]                    Discharge Exam: Ceasar Mons Weights   06/08/23 0251 06/10/23 0400 06/11/23 0330  Weight: 97.3 kg 95.6 kg 94.8 kg   General - Elderly Caucasian  male, no apparent distress HEENT - PERRLA, EOMI, atraumatic head, non tender sinuses. Lung - Clear, rales, rhonchi, wheezes. Heart - S1, S2 heard, no murmurs, rubs, trace pedal edema Neuro - Alert, awake and oriented x 3, non focal exam. Skin - Warm and dry.  Left heel ulcer with dressing noted  Condition at discharge: stable  The results of significant diagnostics from this hospitalization (including imaging, microbiology, ancillary and laboratory) are listed below for reference.   Imaging Studies: DG Foot Complete Right  Result Date: 06/10/2023 CLINICAL DATA:  [id number] Osteomyelitis of ankle or foot, acute, left (HCC) [id number] EXAM: RIGHT FOOT COMPLETE - 3+ VIEW COMPARISON:  None Available. 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 Rounded hyperattenuating structures within the soft tissues at the plantar aspect of the hindfoot adjacent to the posterior calcaneus, likely antibiotic beads. No definite site of bone erosion. Mild soft tissue swelling. Advanced vascular calcifications. Postsurgical changes to the plantar heel. IMPRESSION: Rounded hyperattenuating structures within the soft tissues at the plantar aspect of the  hindfoot adjacent to the posterior calcaneus, likely antibiotic beads. No definite site of bone erosion. Electronically Signed   By: Duanne Guess D.O.   On: 06/10/2023 18:59   DG MINI C-ARM IMAGE ONLY  Result Date: 06/07/2023 There is no interpretation for this exam.  This order is for images obtained during a surgical procedure.  Please See "Surgeries" Tab for more information regarding the procedure.   PERIPHERAL VASCULAR CATHETERIZATION  Result Date: 06/06/2023 See surgical note for result.  MR ANKLE LEFT WO CONTRAST  Result Date: 06/05/2023 CLINICAL DATA:  Diabetic left heel wound. EXAM: MRI OF THE LEFT ANKLE WITHOUT CONTRAST TECHNIQUE: Multiplanar, multisequence MR imaging of the ankle was performed. No intravenous contrast was administered. COMPARISON:  Left foot x-rays from same day. FINDINGS: TENDONS Peroneal: Peroneal longus tendon intact. Peroneal brevis intact. Posteromedial: Posterior tibial tendon intact. Flexor digitorum longus tendon intact. Flexor hallucis longus tendon intact. Anterior: Tibialis anterior tendon intact. Extensor hallucis longus tendon intact. Extensor digitorum longus tendon intact. Achilles:  Intact. Mild distal tendinosis. Plantar Fascia: Intact. Thickened proximal central band. LIGAMENTS Lateral: Anterior talofibular ligament intact. Calcaneofibular ligament intact. Posterior talofibular ligament intact. Anterior and posterior tibiofibular ligaments intact. Medial: Deltoid ligament intact. Spring ligament intact. CARTILAGE Ankle Joint: No joint effusion. Normal ankle mortise. Mild degenerative changes. Tiny focus of marrow edema in the lateral talar dome with overlying cartilage fissuring. Subtalar Joints/Sinus Tarsi: Mild degenerative changes of the subtalar joints. No subtalar joint effusion. Normal sinus tarsi. Bones: Abnormal marrow edema with corresponding decreased T1 marrow signal involving plantar aspect of the calcaneal tuberosity. Remaining marrow signal  is normal. 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 No fracture or dislocation. Soft Tissue: Superficial ulceration along the plantar aspect of the heel. No fluid collection. Diffuse soft tissue swelling. IMPRESSION: 1. Superficial ulceration along the plantar aspect of the heel with underlying osteomyelitis of the plantar calcaneal tuberosity. No abscess. Electronically Signed   By: Obie Dredge M.D.   On: 06/05/2023 07:57   DG Foot Complete Left  Result Date: 06/04/2023 CLINICAL DATA:  Cellulitis.  Draining wound. EXAM: LEFT FOOT - COMPLETE 3 VIEW COMPARISON:  None Available. 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 Underlying osteopenia. Degenerative changes of the midfoot. Plantar calcaneal spur. Vascular calcifications. Soft tissue swelling  about the foot greatest distally. There is also some loss of roundness to the head of second metatarsal. Please correlate for any history such as AVN. No definite erosive changes identified at this time. Please correlate for exact location of open wound. There is some lucency in the soft tissues plantar to the calcaneus posteriorly. Please correlate for soft tissue gas. If there is further concern of bone infection, recommend follow up MRI or bone scan for higher sensitivity. IMPRESSION: No definite erosive changes at this time suggest acute bone infection. Osteopenia with soft tissue swelling and degenerative changes. Previous multifocal amputation. Focal soft tissue gas along the plantar aspect of the foot the level of the calcaneus. Electronically Signed   By: Karen Kays M.D.   On: 06/04/2023 18:23    Microbiology: Results for orders placed or performed during the hospital encounter of 06/04/23  Aerobic/Anaerobic Culture w Gram Stain (surgical/deep wound)     Status: None   Collection Time: 06/04/23  3:34 PM   Specimen: Wound  Result Value Ref Range  Status   Specimen Description WOUND  Final   Special Requests NONE  Final   Gram Stain   Final    FEW GRAM POSITIVE COCCI IN CLUSTERS IN SINGLES FEW GRAM VARIABLE ROD NO WBC SEEN    Culture   Final    MODERATE KLEBSIELLA PNEUMONIAE ABUNDANT PSEUDOMONAS AERUGINOSA FEW STREPTOCOCCUS MITIS/ORALIS RARE BACTEROIDES SPECIES NOT FRAGILIS BETA LACTAMASE POSITIVE    Report Status 06/09/2023 FINAL  Final   Organism ID, Bacteria KLEBSIELLA PNEUMONIAE  Final   Organism ID, Bacteria PSEUDOMONAS AERUGINOSA  Final   Organism ID, Bacteria STREPTOCOCCUS MITIS/ORALIS  Final      Susceptibility   Klebsiella pneumoniae - MIC*    AMPICILLIN >=32 RESISTANT Resistant     CEFEPIME <=0.12 SENSITIVE Sensitive     CEFTAZIDIME <=1 SENSITIVE Sensitive     CEFTRIAXONE <=0.25 SENSITIVE Sensitive     CIPROFLOXACIN 1 RESISTANT Resistant     GENTAMICIN <=1 SENSITIVE Sensitive     IMIPENEM 0.5 SENSITIVE Sensitive     TRIMETH/SULFA <=20 SENSITIVE Sensitive     AMPICILLIN/SULBACTAM 8 SENSITIVE Sensitive     PIP/TAZO 8 SENSITIVE Sensitive     * MODERATE KLEBSIELLA PNEUMONIAE   Pseudomonas aeruginosa - MIC*    CEFTAZIDIME 4 SENSITIVE Sensitive     CIPROFLOXACIN 0.5 SENSITIVE Sensitive     GENTAMICIN <=1 SENSITIVE Sensitive     IMIPENEM 1 SENSITIVE Sensitive     PIP/TAZO 8 SENSITIVE Sensitive     CEFEPIME 2 SENSITIVE Sensitive     * ABUNDANT PSEUDOMONAS AERUGINOSA   Streptococcus mitis/oralis - MIC*    TETRACYCLINE >=16 RESISTANT Resistant     VANCOMYCIN 0.5 SENSITIVE Sensitive     CLINDAMYCIN >=1 RESISTANT Resistant     PENICILLIN Value in next row Sensitive      SENSITIVE0.12    CEFTRIAXONE Value in next row Sensitive      SENSITIVE0.25Performed at [facility], [address], [place], [place] [address]    * FEW STREPTOCOCCUS MITIS/ORALIS  Culture, blood (routine x 2)     Status: None   Collection Time: 06/04/23  3:41 PM   Specimen: BLOOD  Result Value Ref Range Status   Specimen Description  BLOOD BLOOD LEFT ARM  Final   Special Requests   Final    BOTTLES DRAWN AEROBIC AND ANAEROBIC Blood Culture results may not be optimal due to an inadequate volume of blood received in culture bottles   Culture  Final    NO GROWTH 5 DAYS Performed at [facility], [address] [place], [place] [address]    Report Status 06/09/2023 FINAL  Final  Culture, blood (routine x 2)     Status: None   Collection Time: 06/04/23  5:11 PM   Specimen: BLOOD  Result Value Ref Range Status   Specimen Description BLOOD BLOOD RIGHT ARM  Final   Special Requests   Final    BOTTLES DRAWN AEROBIC AND ANAEROBIC Blood Culture results may not be optimal due to an excessive volume of blood received in culture bottles   Culture   Final    NO GROWTH 5 DAYS Performed at [facility], [address] [place], [place] [address]    Report Status 06/09/2023 FINAL  Final  Aerobic/Anaerobic Culture w Gram Stain (surgical/deep wound)     Status: None (Preliminary result)   Collection Time: 06/07/23 12:38 PM   Specimen: PATH Bone resection; Tissue  Result Value Ref Range Status   Specimen Description   Final    TISSUE Performed at [facility], [address] [place], [place] [address]    Special Requests   Final    BONE RESECT OF LEFT HEEL. Performed at [facility], [address] [place], [place] [address]    Gram Stain   Final    NO WBC SEEN NO ORGANISMS SEEN Performed at [facility], [address], [place], [place] [address]    Culture   Final    RARE STAPHYLOCOCCUS EPIDERMIDIS NO ANAEROBES ISOLATED; CULTURE IN PROGRESS FOR 5 DAYS    Report Status PENDING  Incomplete   Organism ID, Bacteria STAPHYLOCOCCUS EPIDERMIDIS  Final      Susceptibility   Staphylococcus epidermidis - MIC*    CIPROFLOXACIN <=0.5 SENSITIVE Sensitive     ERYTHROMYCIN >=8 RESISTANT Resistant     GENTAMICIN <=0.5 SENSITIVE Sensitive     OXACILLIN >=4 RESISTANT Resistant      TETRACYCLINE >=16 RESISTANT Resistant     VANCOMYCIN 1 SENSITIVE Sensitive     TRIMETH/SULFA <=10 SENSITIVE Sensitive     CLINDAMYCIN <=0.25 SENSITIVE Sensitive     RIFAMPIN <=0.5 SENSITIVE Sensitive     Inducible Clindamycin NEGATIVE Sensitive     * RARE STAPHYLOCOCCUS EPIDERMIDIS    Labs: CBC: Recent Labs  Lab 06/07/23 0636 06/10/23 0454  WBC 7.8 8.9  HGB 11.4* 11.5*  HCT 33.8* 34.2*  MCV 92.1 92.2  PLT 196 210   Basic Metabolic Panel: Recent Labs  Lab 06/06/23 0336 06/06/23 2031 06/07/23 0636 06/08/23 0352 06/09/23 1207 06/10/23 0454  NA  --   --  135  --   --  135  K  --   --  4.4  --   --  3.7  CL  --   --  104  --   --  107  CO2  --   --  23  --   --  21*  GLUCOSE  --  434* 238*  --  494* 120*  BUN  --   --  29*  --   --  23  CREATININE 1.46*  --  1.50* 1.32*  --  1.27*  CALCIUM  --   --  8.2*  --   --  8.4*   Liver Function Tests: Recent Labs  Lab 06/07/23 0636  AST 22  ALT 22  ALKPHOS 79  BILITOT 0.7  PROT 5.9*  ALBUMIN 2.5*   CBG: Recent Labs  Lab 06/11/23 1233 06/11/23 1707 06/11/23 2042 06/12/23 0818 06/12/23 1138  GLUCAP 285* 294* 265* 199* 395*    Discharge time spent: greater than 30 minutes.  Signed: Lucile Shutters, MD [facility] 06/12/2023

## 2023-06-12 NOTE — Plan of Care (Signed)
  Problem: Education: Goal: Ability to describe self-care measures that may prevent or decrease complications (Diabetes Survival Skills Education) will improve Outcome: Adequate for Discharge Goal: Individualized Educational Video(s) Outcome: Adequate for Discharge   Problem: Coping: Goal: Ability to adjust to condition or change in health will improve Outcome: Adequate for Discharge   Problem: Fluid Volume: Goal: Ability to maintain a balanced intake and output will improve Outcome: Adequate for Discharge   Problem: Health Behavior/Discharge Planning: Goal: Ability to identify and utilize available resources and services will improve Outcome: Adequate for Discharge Goal: Ability to manage health-related needs will improve Outcome: Adequate for Discharge   Problem: Metabolic: Goal: Ability to maintain appropriate glucose levels will improve Outcome: Adequate for Discharge   Problem: Nutritional: Goal: Maintenance of adequate nutrition will improve Outcome: Adequate for Discharge Goal: Progress toward achieving an optimal weight will improve Outcome: Adequate for Discharge   Problem: Skin Integrity: Goal: Risk for impaired skin integrity will decrease Outcome: Adequate for Discharge   Problem: Tissue Perfusion: Goal: Adequacy of tissue perfusion will improve Outcome: Adequate for Discharge   Problem: Education: Goal: Knowledge of General Education information will improve Description: Including pain rating scale, medication(s)/side effects and non-pharmacologic comfort measures Outcome: Adequate for Discharge   Problem: Health Behavior/Discharge Planning: Goal: Ability to manage health-related needs will improve Outcome: Adequate for Discharge   Problem: Clinical Measurements: Goal: Ability to maintain clinical measurements within normal limits will improve Outcome: Adequate for Discharge Goal: Will remain free from infection Outcome: Adequate for Discharge Goal:  Diagnostic test results will improve Outcome: Adequate for Discharge Goal: Respiratory complications will improve Outcome: Adequate for Discharge Goal: Cardiovascular complication will be avoided Outcome: Adequate for Discharge   Problem: Activity: Goal: Risk for activity intolerance will decrease Outcome: Adequate for Discharge   Problem: Nutrition: Goal: Adequate nutrition will be maintained Outcome: Adequate for Discharge   Problem: Coping: Goal: Level of anxiety will decrease Outcome: Adequate for Discharge   Problem: Elimination: Goal: Will not experience complications related to bowel motility Outcome: Adequate for Discharge Goal: Will not experience complications related to urinary retention Outcome: Adequate for Discharge   Problem: Pain Managment: Goal: General experience of comfort will improve Outcome: Adequate for Discharge   Problem: Safety: Goal: Ability to remain free from injury will improve Outcome: Adequate for Discharge   Problem: Skin Integrity: Goal: Risk for impaired skin integrity will decrease Outcome: Adequate for Discharge   Problem: Education: Goal: Understanding of CV disease, CV risk reduction, and recovery process will improve Outcome: Adequate for Discharge Goal: Individualized Educational Video(s) Outcome: Adequate for Discharge   Problem: Activity: Goal: Ability to return to baseline activity level will improve Outcome: Adequate for Discharge   Problem: Cardiovascular: Goal: Ability to achieve and maintain adequate cardiovascular perfusion will improve Outcome: Adequate for Discharge Goal: Vascular access site(s) Level 0-1 will be maintained Outcome: Adequate for Discharge   Problem: Health Behavior/Discharge Planning: Goal: Ability to safely manage health-related needs after discharge will improve Outcome: Adequate for Discharge   Pt ao x4, respirations even and unlabored on RA. Pt has all belongings. Pt DC instructions given  to transporter. Pt report called to Tarah at [facility]

## 2023-06-12 NOTE — TOC Progression Note (Signed)
Transition of Care [facility]) - Progression Note    Patient Details  Name: Carl Yang MRN: [id number] Date of Birth: 10-29-1940  [facility]) CM/SW Contact  Chapman Fitch, RN Phone Number: 06/12/2023, 10:53 AM  Clinical Narrative:    Secure message sent to MD to determine if patient would be returning to [facility] SNF today   Expected Discharge Plan: Skilled Nursing Facility Barriers to Discharge: Continued Medical Work up  Expected Discharge Plan and Services       Living arrangements for the past 2 months:  ([facility]) Expected Discharge Date: 06/11/23                                     Social Determinants of Health (SDOH) Interventions SDOH Screenings   Food Insecurity: No Food Insecurity (06/05/2023)  Housing: Patient Declined (06/05/2023)  Transportation Needs: No Transportation Needs (06/05/2023)  Utilities: Not At Risk (06/05/2023)  Depression (PHQ2-9): Low Risk  (08/18/2019)  Financial Resource Strain: Low Risk  (05/07/2023)   [facility] from [facility]  Tobacco Use: Low Risk  (06/07/2023)    Readmission Risk Interventions     No data to display

## 2023-06-12 NOTE — Discharge Instructions (Addendum)
Patient needs to be completely nonweightbearing on the left lower extremity Betadine applied to the incision area followed by a sterile gauze dressing with ABD and Kerlix wrap. Patient will have dressing changes every other day with a bulky dry sterile dressing.

## 2023-06-12 NOTE — Plan of Care (Signed)

## 2023-06-12 NOTE — TOC Transition Note (Signed)
Transition of Care [facility]) - CM/SW Discharge Note   Patient Details  Name: Carl Yang MRN: [id number] Date of Birth: 1941-04-30  Transition of Care [facility]) CM/SW Contact:  Chapman Fitch, RN Phone Number: 06/12/2023, 12:54 PM   Clinical Narrative:     Patient will DC to: [facility] Anticipated DC date: 06/12/23  Family notified: Patient request for daughter, and significant other Dondra Spry be updated.  VM left for daughter, gail notified Transport by: Danie Chandler to provide transport, and will coordinate directly with bedside RN  Per MD patient ready for DC to . RN, patient, patient's family, and facility notified of DC. Discharge Summary sent to facility. RN given number for report. DC packet on chart.  TOC signing off.     Barriers to Discharge: Continued Medical Work up   Patient Goals and CMS Choice CMS Medicare.gov Compare Post Acute Care list provided to:: Patient Represenative (must comment) (daughter [name]) Choice offered to / list presented to : [facility] POA / Guardian  Discharge Placement                         Discharge Plan and Services Additional resources added to the After Visit Summary for                                       Social Determinants of Health (SDOH) Interventions SDOH Screenings   Food Insecurity: No Food Insecurity (06/05/2023)  Housing: Patient Declined (06/05/2023)  Transportation Needs: No Transportation Needs (06/05/2023)  Utilities: Not At Risk (06/05/2023)  Depression (PHQ2-9): Low Risk  (08/18/2019)  Financial Resource Strain: Low Risk  (05/07/2023)   Received from [facility]  Tobacco Use: Low Risk  (06/07/2023)     Readmission Risk Interventions     No data to display

## 2023-06-13 LAB — AEROBIC/ANAEROBIC CULTURE W GRAM STAIN (SURGICAL/DEEP WOUND): Gram Stain: NONE SEEN

## 2023-06-17 ENCOUNTER — Encounter (INDEPENDENT_AMBULATORY_CARE_PROVIDER_SITE_OTHER): Payer: Medicare Other | Admitting: Ophthalmology

## 2023-06-17 DIAGNOSIS — Z794 Long term (current) use of insulin: Secondary | ICD-10-CM

## 2023-06-17 DIAGNOSIS — Z7984 Long term (current) use of oral hypoglycemic drugs: Secondary | ICD-10-CM

## 2023-06-17 DIAGNOSIS — E113391 Type 2 diabetes mellitus with moderate nonproliferative diabetic retinopathy without macular edema, right eye: Secondary | ICD-10-CM

## 2023-06-17 DIAGNOSIS — H43813 Vitreous degeneration, bilateral: Secondary | ICD-10-CM

## 2023-06-17 DIAGNOSIS — E113312 Type 2 diabetes mellitus with moderate nonproliferative diabetic retinopathy with macular edema, left eye: Secondary | ICD-10-CM

## 2023-07-04 ENCOUNTER — Emergency Department
Admission: EM | Admit: 2023-07-04 | Discharge: 2023-07-04 | Disposition: A | Discharge status: Home / Self Care | Payer: Medicare Other | Attending: Emergency Medicine | Admitting: Emergency Medicine

## 2023-07-04 ENCOUNTER — Encounter: Payer: Self-pay | Admitting: Emergency Medicine

## 2023-07-04 ENCOUNTER — Other Ambulatory Visit: Payer: Self-pay

## 2023-07-04 DIAGNOSIS — I2510 Atherosclerotic heart disease of native coronary artery without angina pectoris: Secondary | ICD-10-CM | POA: Diagnosis not present

## 2023-07-04 DIAGNOSIS — E10649 Type 1 diabetes mellitus with hypoglycemia without coma: Secondary | ICD-10-CM | POA: Diagnosis not present

## 2023-07-04 DIAGNOSIS — R001 Bradycardia, unspecified: Secondary | ICD-10-CM | POA: Diagnosis not present

## 2023-07-04 DIAGNOSIS — I10 Essential (primary) hypertension: Secondary | ICD-10-CM | POA: Diagnosis not present

## 2023-07-04 DIAGNOSIS — E162 Hypoglycemia, unspecified: Secondary | ICD-10-CM | POA: Diagnosis present

## 2023-07-04 LAB — CBC WITH DIFFERENTIAL/PLATELET
Abs Immature Granulocytes: 0.04 10*3/uL (ref 0.00–0.07)
Basophils Absolute: 0 10*3/uL (ref 0.0–0.1)
Basophils Relative: 0 %
Eosinophils Absolute: 0.3 10*3/uL (ref 0.0–0.5)
Eosinophils Relative: 3 %
HCT: 41.8 % (ref 39.0–52.0)
Hemoglobin: 13 g/dL (ref 13.0–17.0)
Immature Granulocytes: 0 %
Lymphocytes Relative: 11 %
Lymphs Abs: 1.1 10*3/uL (ref 0.7–4.0)
MCH: 30.2 pg (ref 26.0–34.0)
MCHC: 31.1 g/dL (ref 30.0–36.0)
MCV: 97.2 fL (ref 80.0–100.0)
Monocytes Absolute: 0.7 10*3/uL (ref 0.1–1.0)
Monocytes Relative: 7 %
Neutro Abs: 7.4 10*3/uL (ref 1.7–7.7)
Neutrophils Relative %: 79 %
Platelets: 181 10*3/uL (ref 150–400)
RBC: 4.3 MIL/uL (ref 4.22–5.81)
RDW: 14.6 % (ref 11.5–15.5)
WBC: 9.5 10*3/uL (ref 4.0–10.5)
nRBC: 0 % (ref 0.0–0.2)

## 2023-07-04 LAB — COMPREHENSIVE METABOLIC PANEL
ALT: 19 U/L (ref 0–44)
AST: 25 U/L (ref 15–41)
Albumin: 3.1 g/dL — ABNORMAL LOW (ref 3.5–5.0)
Alkaline Phosphatase: 64 U/L (ref 38–126)
Anion gap: 8 (ref 5–15)
BUN: 32 mg/dL — ABNORMAL HIGH (ref 8–23)
CO2: 22 mmol/L (ref 22–32)
Calcium: 8.4 mg/dL — ABNORMAL LOW (ref 8.9–10.3)
Chloride: 106 mmol/L (ref 98–111)
Creatinine, Ser: 1.38 mg/dL — ABNORMAL HIGH (ref 0.61–1.24)
GFR, Estimated: 52 mL/min — ABNORMAL LOW (ref >60)
Glucose, Bld: 183 mg/dL — ABNORMAL HIGH (ref 70–99)
Potassium: 4.3 mmol/L (ref 3.5–5.1)
Sodium: 136 mmol/L (ref 135–145)
Total Bilirubin: 0.4 mg/dL (ref 0.3–1.2)
Total Protein: 6.3 g/dL — ABNORMAL LOW (ref 6.5–8.1)

## 2023-07-04 LAB — CBG MONITORING, ED
Glucose-Capillary: 57 mg/dL — ABNORMAL LOW (ref 70–99)
Glucose-Capillary: 125 mg/dL — ABNORMAL HIGH (ref 70–99)
Glucose-Capillary: 164 mg/dL — ABNORMAL HIGH (ref 70–99)

## 2023-07-04 NOTE — ED Triage Notes (Signed)
Patient to ED via [facility] from the [facility] of [place]. Patient found at facility with a blood sugar in the 30's. Given 3mg  of glucagon IN and 1mg  of glucagon IM. Patient awake and answering questions at this time.

## 2023-07-04 NOTE — Inpatient Diabetes Management (Signed)
Inpatient Diabetes Program Recommendations  AACE/ADA: New Consensus Statement on Inpatient Glycemic Control (2015)  Target Ranges:  Prepandial:   less than 140 mg/dL      Peak postprandial:   less than 180 mg/dL (1-2 hours)      Critically ill patients:  140 - 180 mg/dL   Lab Results  Component Value Date   GLUCAP 164 (H) 07/04/2023   HGBA1C 8.9 (H) 06/05/2023    Latest Reference Range & Units 07/04/23 10:18 07/04/23 10:55 07/04/23 11:46  Glucose-Capillary 70 - 99 mg/dL 57 (L) 161 (H) 096 (H)  (L): Data is abnormally low (H): Data is abnormally high  Diabetes history: DM1 Outpatient Diabetes medications: Semglee 48 units daily,Novolog 18 units tid meal coverage, Novolin R 0-10 units tid correction Current orders for Inpatient glycemic control: CBGs only  Inpatient Diabetes Program Recommendations:   Patient currently in the ED. Please consider when CBGs stable range: -Semglee 40 units daily (start 07/05/23-has received 48 units this am -Novolog 5 units tid meal coverage and adjust as needed -Novolog 0-9 units tid, 0-5 units hs  Patient was just discharged on 06/12/23. Spoke with significant other Charlann Noss regarding patient's insulin regimen. Patient decided to go back to insulin injections after leaving the hospital and going to [facility]. She was concerned patient was given meal coverage without given the meal @ the same time. Called and spoke with nurse Marya Fossa @ [facility] and she administered patient's insulin this am. Fasting CBG was 82, patient received Novolog 18 units meal coverage when meal arrived and Semglee 48 units. Nurse states his Fasting CBG has been running lower the last few days from 82-92. No guidelines @ [facility] of when to adjust or lower doses of meal coverage or fasting and nurse plans to leave note for patient's doctor to review CBG trends.   Thank you, Billy Fischer. Makaio Mach, RN, MSN, CDE  Diabetes Coordinator Inpatient Glycemic Control  Team Team Pager [phone] (8am-5pm) 07/04/2023 1:23 PM

## 2023-07-04 NOTE — ED Notes (Signed)
Pt given crackers and juice to eat. Pt consuming meal given at this time.

## 2023-07-04 NOTE — ED Provider Notes (Signed)
[facility] Provider Note    Event Date/Time   First MD Initiated Contact with Patient 07/04/23 1012     (approximate)   History   Chief Complaint: Hypoglycemia   HPI  Carl Yang is a 82 y.o. male with a history of CAD, hypertension, diabetes who was brought to the ED due to hypoglycemia.  Reportedly patient was given fast acting insulin this morning per his usual medication schedule, but breakfast did not come for another hour and a half so then when staff checked on him later prior to eating, he was confused.  Blood sugar was 30.  He was given glucagon and some oral glucose at that time, and was improving but EMS was called and he was brought to the ED for further evaluation.  Currently he denies any symptoms.  Feels back to normal.  EMS does agree that mental status has improved substantially during their transport.  Patient denies any complaints or recent illness.  Eating okay taking his medications normally, no fever.     Physical Exam   Triage Vital Signs: ED Triage Vitals  Encounter Vitals Group     BP --      Systolic BP Percentile --      Diastolic BP Percentile --      Pulse --      Resp --      Temp --      Temp src --      SpO2 --      Weight 07/04/23 1018 220 lb (99.8 kg)     Height 07/04/23 1018 6\' 3"  (1.905 m)     Head Circumference --      Peak Flow --      Pain Score 07/04/23 1013 0     Pain Loc --      Pain Education --      Exclude from Growth Chart --     Most recent vital signs: Vitals:   07/04/23 1215 07/04/23 1330  BP: (!) 137/51 121/88  Pulse: (!) 53 (!) 52  Resp: (!) 22 (!) 24  Temp:    SpO2: 100% 100%    General: Awake, no distress.  CV:  Good peripheral perfusion.  Bradycardia, heart rate 55.  Normal distal pulses Resp:  Normal effort.  Clear to auscultation bilaterally Abd:  No distention.  Soft nontender Other:  Moist oral mucosa   ED Results / Procedures / Treatments   Labs (all labs ordered  are listed, but only abnormal results are displayed) Labs Reviewed  COMPREHENSIVE METABOLIC PANEL - Abnormal; Notable for the following components:      Result Value   Glucose, Bld 183 (*)    BUN 32 (*)    Creatinine, Ser 1.38 (*)    Calcium 8.4 (*)    Total Protein 6.3 (*)    Albumin 3.1 (*)    GFR, Estimated 52 (*)    All other components within normal limits  CBG MONITORING, ED - Abnormal; Notable for the following components:   Glucose-Capillary 57 (*)    All other components within normal limits  CBG MONITORING, ED - Abnormal; Notable for the following components:   Glucose-Capillary 125 (*)    All other components within normal limits  CBG MONITORING, ED - Abnormal; Notable for the following components:   Glucose-Capillary 164 (*)    All other components within normal limits  CBC WITH DIFFERENTIAL/PLATELET  URINALYSIS, W/ REFLEX TO CULTURE (INFECTION SUSPECTED)  EKG Interpreted by me Sinus bradycardia, rate of 53.  Normal axis, normal intervals.  Normal QRS ST segments and T waves.  No ischemic changes.   RADIOLOGY    PROCEDURES:  Procedures   MEDICATIONS ORDERED IN ED: Medications - No data to display   IMPRESSION / MDM / ASSESSMENT AND PLAN / ED COURSE  I reviewed the triage vital signs and the nursing notes.  DDx: Accidental insulin induced hypoglycemia, electrolyte abnormality, AKI,.  Doubt infection or sepsis.  Patient's presentation is most consistent with acute presentation with potential threat to life or bodily function.  Patient sent to the ED for fingerstick glucose of 30 at his residential healthcare facility.  On arrival, he is in his usual state of health, normal vital signs, asymptomatic, reassuring exam.  Will check labs, continue feeding ad lib.   ----------------------------------------- 1:22 PM on 07/04/2023 ----------------------------------------- Patient remains asymptomatic in the ED.  Vital signs unremarkable, labs  unremarkable.  Serum glucose stable.  Stable for discharge.      FINAL CLINICAL IMPRESSION(S) / ED DIAGNOSES   Final diagnoses:  Type 1 diabetes mellitus with hypoglycemia and without coma (HCC)     Rx / DC Orders   ED Discharge Orders     None        Note:  This document was prepared using Dragon voice recognition software and may include unintentional dictation errors.   Sharman Cheek, MD 07/04/23 1335

## 2023-07-06 NOTE — Plan of Care (Signed)
[facility] Tonsillectomy/Adenoidectomy, Postoperative PEDS care plan entered in error.

## 2023-07-29 ENCOUNTER — Encounter (INDEPENDENT_AMBULATORY_CARE_PROVIDER_SITE_OTHER): Payer: Medicare Other | Admitting: Ophthalmology

## 2023-07-29 DIAGNOSIS — H43813 Vitreous degeneration, bilateral: Secondary | ICD-10-CM

## 2023-07-29 DIAGNOSIS — E113312 Type 2 diabetes mellitus with moderate nonproliferative diabetic retinopathy with macular edema, left eye: Secondary | ICD-10-CM

## 2023-07-29 DIAGNOSIS — Z7984 Long term (current) use of oral hypoglycemic drugs: Secondary | ICD-10-CM

## 2023-07-29 DIAGNOSIS — E113391 Type 2 diabetes mellitus with moderate nonproliferative diabetic retinopathy without macular edema, right eye: Secondary | ICD-10-CM | POA: Diagnosis not present

## 2023-07-29 DIAGNOSIS — Z794 Long term (current) use of insulin: Secondary | ICD-10-CM | POA: Diagnosis not present

## 2023-09-09 ENCOUNTER — Encounter (INDEPENDENT_AMBULATORY_CARE_PROVIDER_SITE_OTHER): Payer: Medicare Other | Admitting: Ophthalmology

## 2023-09-09 DIAGNOSIS — H43813 Vitreous degeneration, bilateral: Secondary | ICD-10-CM

## 2023-09-09 DIAGNOSIS — Z7984 Long term (current) use of oral hypoglycemic drugs: Secondary | ICD-10-CM | POA: Diagnosis not present

## 2023-09-09 DIAGNOSIS — E113391 Type 2 diabetes mellitus with moderate nonproliferative diabetic retinopathy without macular edema, right eye: Secondary | ICD-10-CM

## 2023-09-09 DIAGNOSIS — Z794 Long term (current) use of insulin: Secondary | ICD-10-CM | POA: Diagnosis not present

## 2023-09-09 DIAGNOSIS — E113312 Type 2 diabetes mellitus with moderate nonproliferative diabetic retinopathy with macular edema, left eye: Secondary | ICD-10-CM

## 2023-10-21 ENCOUNTER — Encounter (INDEPENDENT_AMBULATORY_CARE_PROVIDER_SITE_OTHER): Payer: Medicare Other | Admitting: Ophthalmology

## 2023-10-21 DIAGNOSIS — Z7984 Long term (current) use of oral hypoglycemic drugs: Secondary | ICD-10-CM | POA: Diagnosis not present

## 2023-10-21 DIAGNOSIS — E113312 Type 2 diabetes mellitus with moderate nonproliferative diabetic retinopathy with macular edema, left eye: Secondary | ICD-10-CM

## 2023-10-21 DIAGNOSIS — E113391 Type 2 diabetes mellitus with moderate nonproliferative diabetic retinopathy without macular edema, right eye: Secondary | ICD-10-CM | POA: Diagnosis not present

## 2023-10-21 DIAGNOSIS — Z794 Long term (current) use of insulin: Secondary | ICD-10-CM | POA: Diagnosis not present

## 2023-10-21 DIAGNOSIS — H43813 Vitreous degeneration, bilateral: Secondary | ICD-10-CM

## 2023-12-02 ENCOUNTER — Encounter (INDEPENDENT_AMBULATORY_CARE_PROVIDER_SITE_OTHER): Payer: Medicare Other | Admitting: Ophthalmology

## 2023-12-02 DIAGNOSIS — E113391 Type 2 diabetes mellitus with moderate nonproliferative diabetic retinopathy without macular edema, right eye: Secondary | ICD-10-CM | POA: Diagnosis not present

## 2023-12-02 DIAGNOSIS — H43813 Vitreous degeneration, bilateral: Secondary | ICD-10-CM

## 2023-12-02 DIAGNOSIS — Z7984 Long term (current) use of oral hypoglycemic drugs: Secondary | ICD-10-CM | POA: Diagnosis not present

## 2023-12-02 DIAGNOSIS — Z794 Long term (current) use of insulin: Secondary | ICD-10-CM | POA: Diagnosis not present

## 2023-12-02 DIAGNOSIS — E113312 Type 2 diabetes mellitus with moderate nonproliferative diabetic retinopathy with macular edema, left eye: Secondary | ICD-10-CM

## 2024-01-22 ENCOUNTER — Encounter (INDEPENDENT_AMBULATORY_CARE_PROVIDER_SITE_OTHER): Admitting: Ophthalmology

## 2024-01-22 DIAGNOSIS — H43813 Vitreous degeneration, bilateral: Secondary | ICD-10-CM

## 2024-01-22 DIAGNOSIS — Z7984 Long term (current) use of oral hypoglycemic drugs: Secondary | ICD-10-CM | POA: Diagnosis not present

## 2024-01-22 DIAGNOSIS — E113312 Type 2 diabetes mellitus with moderate nonproliferative diabetic retinopathy with macular edema, left eye: Secondary | ICD-10-CM | POA: Diagnosis not present

## 2024-01-22 DIAGNOSIS — E113391 Type 2 diabetes mellitus with moderate nonproliferative diabetic retinopathy without macular edema, right eye: Secondary | ICD-10-CM

## 2024-01-22 DIAGNOSIS — Z794 Long term (current) use of insulin: Secondary | ICD-10-CM

## 2024-03-07 ENCOUNTER — Inpatient Hospital Stay

## 2024-03-07 ENCOUNTER — Inpatient Hospital Stay
Admission: EM | Admit: 2024-03-07 | Discharge: 2024-03-11 | DRG: 308 | Disposition: A | Discharge status: Skilled Nursing Facility | Source: Skilled Nursing Facility | Attending: Internal Medicine | Admitting: Internal Medicine

## 2024-03-07 ENCOUNTER — Other Ambulatory Visit: Payer: Self-pay

## 2024-03-07 DIAGNOSIS — E1022 Type 1 diabetes mellitus with diabetic chronic kidney disease: Secondary | ICD-10-CM | POA: Diagnosis present

## 2024-03-07 DIAGNOSIS — Z66 Do not resuscitate: Secondary | ICD-10-CM | POA: Diagnosis present

## 2024-03-07 DIAGNOSIS — E1042 Type 1 diabetes mellitus with diabetic polyneuropathy: Secondary | ICD-10-CM | POA: Diagnosis present

## 2024-03-07 DIAGNOSIS — I442 Atrioventricular block, complete: Secondary | ICD-10-CM | POA: Diagnosis present

## 2024-03-07 DIAGNOSIS — N1831 Chronic kidney disease, stage 3a: Secondary | ICD-10-CM | POA: Diagnosis present

## 2024-03-07 DIAGNOSIS — R739 Hyperglycemia, unspecified: Secondary | ICD-10-CM

## 2024-03-07 DIAGNOSIS — Z7901 Long term (current) use of anticoagulants: Secondary | ICD-10-CM

## 2024-03-07 DIAGNOSIS — G9341 Metabolic encephalopathy: Secondary | ICD-10-CM | POA: Diagnosis present

## 2024-03-07 DIAGNOSIS — I10 Essential (primary) hypertension: Secondary | ICD-10-CM | POA: Diagnosis present

## 2024-03-07 DIAGNOSIS — E871 Hypo-osmolality and hyponatremia: Secondary | ICD-10-CM | POA: Diagnosis not present

## 2024-03-07 DIAGNOSIS — Z85820 Personal history of malignant melanoma of skin: Secondary | ICD-10-CM

## 2024-03-07 DIAGNOSIS — E1065 Type 1 diabetes mellitus with hyperglycemia: Secondary | ICD-10-CM | POA: Diagnosis present

## 2024-03-07 DIAGNOSIS — M1990 Unspecified osteoarthritis, unspecified site: Secondary | ICD-10-CM | POA: Diagnosis present

## 2024-03-07 DIAGNOSIS — F0390 Unspecified dementia without behavioral disturbance: Secondary | ICD-10-CM | POA: Insufficient documentation

## 2024-03-07 DIAGNOSIS — Z86718 Personal history of other venous thrombosis and embolism: Secondary | ICD-10-CM

## 2024-03-07 DIAGNOSIS — Z955 Presence of coronary angioplasty implant and graft: Secondary | ICD-10-CM

## 2024-03-07 DIAGNOSIS — Z89422 Acquired absence of other left toe(s): Secondary | ICD-10-CM

## 2024-03-07 DIAGNOSIS — R627 Adult failure to thrive: Secondary | ICD-10-CM | POA: Diagnosis present

## 2024-03-07 DIAGNOSIS — D6869 Other thrombophilia: Secondary | ICD-10-CM | POA: Diagnosis present

## 2024-03-07 DIAGNOSIS — Z794 Long term (current) use of insulin: Secondary | ICD-10-CM | POA: Diagnosis not present

## 2024-03-07 DIAGNOSIS — I499 Cardiac arrhythmia, unspecified: Secondary | ICD-10-CM | POA: Diagnosis not present

## 2024-03-07 DIAGNOSIS — L03115 Cellulitis of right lower limb: Secondary | ICD-10-CM | POA: Diagnosis present

## 2024-03-07 DIAGNOSIS — R7989 Other specified abnormal findings of blood chemistry: Secondary | ICD-10-CM | POA: Diagnosis not present

## 2024-03-07 DIAGNOSIS — F03A0 Unspecified dementia, mild, without behavioral disturbance, psychotic disturbance, mood disturbance, and anxiety: Secondary | ICD-10-CM | POA: Diagnosis present

## 2024-03-07 DIAGNOSIS — I480 Paroxysmal atrial fibrillation: Secondary | ICD-10-CM | POA: Diagnosis not present

## 2024-03-07 DIAGNOSIS — L0390 Cellulitis, unspecified: Secondary | ICD-10-CM | POA: Insufficient documentation

## 2024-03-07 DIAGNOSIS — E785 Hyperlipidemia, unspecified: Secondary | ICD-10-CM | POA: Diagnosis present

## 2024-03-07 DIAGNOSIS — E1100 Type 2 diabetes mellitus with hyperosmolarity without nonketotic hyperglycemic-hyperosmolar coma (NKHHC): Secondary | ICD-10-CM | POA: Diagnosis not present

## 2024-03-07 DIAGNOSIS — R001 Bradycardia, unspecified: Principal | ICD-10-CM | POA: Diagnosis present

## 2024-03-07 DIAGNOSIS — E1051 Type 1 diabetes mellitus with diabetic peripheral angiopathy without gangrene: Secondary | ICD-10-CM | POA: Diagnosis present

## 2024-03-07 DIAGNOSIS — N179 Acute kidney failure, unspecified: Secondary | ICD-10-CM | POA: Diagnosis present

## 2024-03-07 DIAGNOSIS — I129 Hypertensive chronic kidney disease with stage 1 through stage 4 chronic kidney disease, or unspecified chronic kidney disease: Secondary | ICD-10-CM | POA: Diagnosis present

## 2024-03-07 DIAGNOSIS — I2510 Atherosclerotic heart disease of native coronary artery without angina pectoris: Secondary | ICD-10-CM | POA: Diagnosis present

## 2024-03-07 DIAGNOSIS — I2489 Other forms of acute ischemic heart disease: Secondary | ICD-10-CM | POA: Diagnosis present

## 2024-03-07 DIAGNOSIS — E663 Overweight: Secondary | ICD-10-CM | POA: Diagnosis present

## 2024-03-07 DIAGNOSIS — E875 Hyperkalemia: Secondary | ICD-10-CM | POA: Diagnosis present

## 2024-03-07 DIAGNOSIS — K219 Gastro-esophageal reflux disease without esophagitis: Secondary | ICD-10-CM | POA: Diagnosis present

## 2024-03-07 DIAGNOSIS — Z7984 Long term (current) use of oral hypoglycemic drugs: Secondary | ICD-10-CM

## 2024-03-07 DIAGNOSIS — Z981 Arthrodesis status: Secondary | ICD-10-CM

## 2024-03-07 DIAGNOSIS — Z9641 Presence of insulin pump (external) (internal): Secondary | ICD-10-CM | POA: Diagnosis present

## 2024-03-07 DIAGNOSIS — I252 Old myocardial infarction: Secondary | ICD-10-CM

## 2024-03-07 DIAGNOSIS — G4733 Obstructive sleep apnea (adult) (pediatric): Secondary | ICD-10-CM | POA: Diagnosis present

## 2024-03-07 DIAGNOSIS — Z79899 Other long term (current) drug therapy: Secondary | ICD-10-CM

## 2024-03-07 DIAGNOSIS — E860 Dehydration: Secondary | ICD-10-CM | POA: Diagnosis present

## 2024-03-07 DIAGNOSIS — Z96653 Presence of artificial knee joint, bilateral: Secondary | ICD-10-CM | POA: Diagnosis present

## 2024-03-07 LAB — GLUCOSE, CAPILLARY
Glucose-Capillary: 425 mg/dL — ABNORMAL HIGH (ref 70–99)
Glucose-Capillary: 480 mg/dL — ABNORMAL HIGH (ref 70–99)
Glucose-Capillary: 477 mg/dL — ABNORMAL HIGH (ref 70–99)
Glucose-Capillary: 360 mg/dL — ABNORMAL HIGH (ref 70–99)
Glucose-Capillary: 409 mg/dL — ABNORMAL HIGH (ref 70–99)

## 2024-03-07 LAB — HEPATIC FUNCTION PANEL
ALT: 27 U/L (ref 0–44)
AST: 30 U/L (ref 15–41)
Albumin: 3.4 g/dL — ABNORMAL LOW (ref 3.5–5.0)
Alkaline Phosphatase: 62 U/L (ref 38–126)
Bilirubin, Direct: 0.1 mg/dL (ref 0.0–0.2)
Indirect Bilirubin: 0.8 mg/dL (ref 0.3–0.9)
Total Bilirubin: 0.9 mg/dL (ref 0.0–1.2)
Total Protein: 5.8 g/dL — ABNORMAL LOW (ref 6.5–8.1)

## 2024-03-07 LAB — BASIC METABOLIC PANEL WITH GFR
Anion gap: 9 (ref 5–15)
Anion gap: 6 (ref 5–15)
Anion gap: 9 (ref 5–15)
BUN: 38 mg/dL — ABNORMAL HIGH (ref 8–23)
BUN: 37 mg/dL — ABNORMAL HIGH (ref 8–23)
BUN: 35 mg/dL — ABNORMAL HIGH (ref 8–23)
CO2: 22 mmol/L (ref 22–32)
CO2: 28 mmol/L (ref 22–32)
CO2: 24 mmol/L (ref 22–32)
Calcium: 8.4 mg/dL — ABNORMAL LOW (ref 8.9–10.3)
Calcium: 9 mg/dL (ref 8.9–10.3)
Calcium: 8.5 mg/dL — ABNORMAL LOW (ref 8.9–10.3)
Chloride: 95 mmol/L — ABNORMAL LOW (ref 98–111)
Chloride: 98 mmol/L (ref 98–111)
Chloride: 102 mmol/L (ref 98–111)
Creatinine, Ser: 1.62 mg/dL — ABNORMAL HIGH (ref 0.61–1.24)
Creatinine, Ser: 1.72 mg/dL — ABNORMAL HIGH (ref 0.61–1.24)
Creatinine, Ser: 1.54 mg/dL — ABNORMAL HIGH (ref 0.61–1.24)
GFR, Estimated: 42 mL/min — ABNORMAL LOW (ref >60)
GFR, Estimated: 39 mL/min — ABNORMAL LOW (ref >60)
GFR, Estimated: 45 mL/min — ABNORMAL LOW (ref >60)
Glucose, Bld: 603 mg/dL (ref 70–99)
Glucose, Bld: 529 mg/dL (ref 70–99)
Glucose, Bld: 386 mg/dL — ABNORMAL HIGH (ref 70–99)
Potassium: 6.2 mmol/L — ABNORMAL HIGH (ref 3.5–5.1)
Potassium: 4.9 mmol/L (ref 3.5–5.1)
Potassium: 3.8 mmol/L (ref 3.5–5.1)
Sodium: 126 mmol/L — ABNORMAL LOW (ref 135–145)
Sodium: 132 mmol/L — ABNORMAL LOW (ref 135–145)
Sodium: 135 mmol/L (ref 135–145)

## 2024-03-07 LAB — BLOOD GAS, VENOUS
Acid-base deficit: 0.8 mmol/L (ref 0.0–2.0)
Bicarbonate: 24.9 mmol/L (ref 20.0–28.0)
O2 Saturation: 75.3 %
Patient temperature: 37
pCO2, Ven: 44 mmHg (ref 44–60)
pH, Ven: 7.36 (ref 7.25–7.43)
pO2, Ven: 43 mmHg (ref 32–45)

## 2024-03-07 LAB — URINALYSIS, ROUTINE W REFLEX MICROSCOPIC
Bacteria, UA: NONE SEEN
Bilirubin Urine: NEGATIVE
Glucose, UA: >=500 mg/dL — AB
Hgb urine dipstick: NEGATIVE
Ketones, ur: 5 mg/dL — AB
Leukocytes,Ua: NEGATIVE
Nitrite: NEGATIVE
Protein, ur: NEGATIVE mg/dL
Specific Gravity, Urine: 1.022 (ref 1.005–1.030)
Squamous Epithelial / HPF: 0 /HPF (ref 0–5)
pH: 7 (ref 5.0–8.0)

## 2024-03-07 LAB — TROPONIN I (HIGH SENSITIVITY)
Troponin I (High Sensitivity): 38 ng/L — ABNORMAL HIGH (ref <18)
Troponin I (High Sensitivity): 39 ng/L — ABNORMAL HIGH (ref <18)

## 2024-03-07 LAB — CBG MONITORING, ED
Glucose-Capillary: 548 mg/dL (ref 70–99)
Glucose-Capillary: 520 mg/dL (ref 70–99)
Glucose-Capillary: 556 mg/dL (ref 70–99)

## 2024-03-07 LAB — CBC
HCT: 33 % — ABNORMAL LOW (ref 39.0–52.0)
Hemoglobin: 11 g/dL — ABNORMAL LOW (ref 13.0–17.0)
MCH: 30.7 pg (ref 26.0–34.0)
MCHC: 33.3 g/dL (ref 30.0–36.0)
MCV: 92.2 fL (ref 80.0–100.0)
Platelets: 156 10*3/uL (ref 150–400)
RBC: 3.58 MIL/uL — ABNORMAL LOW (ref 4.22–5.81)
RDW: 14.7 % (ref 11.5–15.5)
WBC: 5.7 10*3/uL (ref 4.0–10.5)
nRBC: 0 % (ref 0.0–0.2)

## 2024-03-07 LAB — CK: Total CK: 177 U/L (ref 49–397)

## 2024-03-07 LAB — BETA-HYDROXYBUTYRIC ACID: Beta-Hydroxybutyric Acid: 0.21 mmol/L (ref 0.05–0.27)

## 2024-03-07 LAB — AMMONIA: Ammonia: <13 umol/L (ref 9–35)

## 2024-03-07 LAB — TSH: TSH: 2.306 u[IU]/mL (ref 0.350–4.500)

## 2024-03-07 LAB — MAGNESIUM: Magnesium: 1.9 mg/dL (ref 1.7–2.4)

## 2024-03-07 LAB — OSMOLALITY: Osmolality: 318 mosm/kg — ABNORMAL HIGH (ref 275–295)

## 2024-03-07 MED ORDER — CEFAZOLIN SODIUM-DEXTROSE 2-4 GM/100ML-% IV SOLN
2.0000 g | Freq: Three times a day (TID) | INTRAVENOUS | Status: DC
  Administered 2024-03-07 – 2024-03-10 (×9): 2 g via INTRAVENOUS
  Filled 2024-03-07 (×9): qty 100

## 2024-03-07 MED ORDER — LACTATED RINGERS IV BOLUS
20.0000 mL/kg | Freq: Once | INTRAVENOUS | Status: AC
  Administered 2024-03-07: 1770 mL via INTRAVENOUS

## 2024-03-07 MED ORDER — ROSUVASTATIN CALCIUM 10 MG PO TABS
40.0000 mg | ORAL_TABLET | Freq: Every day | ORAL | Status: DC
  Administered 2024-03-07 – 2024-03-10 (×4): 40 mg via ORAL
  Filled 2024-03-07 (×2): qty 4
  Filled 2024-03-07: qty 2
  Filled 2024-03-07: qty 4

## 2024-03-07 MED ORDER — ALBUTEROL SULFATE (2.5 MG/3ML) 0.083% IN NEBU
10.0000 mg | INHALATION_SOLUTION | Freq: Once | RESPIRATORY_TRACT | Status: AC
  Administered 2024-03-07: 10 mg via RESPIRATORY_TRACT
  Filled 2024-03-07: qty 12

## 2024-03-07 MED ORDER — APIXABAN 2.5 MG PO TABS
2.5000 mg | ORAL_TABLET | Freq: Two times a day (BID) | ORAL | Status: DC
  Administered 2024-03-07 – 2024-03-11 (×8): 2.5 mg via ORAL
  Filled 2024-03-07 (×8): qty 1

## 2024-03-07 MED ORDER — SODIUM ZIRCONIUM CYCLOSILICATE 5 G PO PACK
10.0000 g | PACK | Freq: Three times a day (TID) | ORAL | Status: DC
  Administered 2024-03-07: 10 g via ORAL
  Filled 2024-03-07: qty 1

## 2024-03-07 MED ORDER — LACTATED RINGERS IV SOLN
INTRAVENOUS | Status: DC
  Administered 2024-03-07: 125 mL/h via INTRAVENOUS

## 2024-03-07 MED ORDER — INSULIN REGULAR(HUMAN) IN NACL 100-0.9 UT/100ML-% IV SOLN
INTRAVENOUS | Status: DC
  Administered 2024-03-07: 9 [IU]/h via INTRAVENOUS
  Administered 2024-03-08: 10.5 [IU]/h via INTRAVENOUS
  Filled 2024-03-07 (×2): qty 100

## 2024-03-07 MED ORDER — LORATADINE 10 MG PO TABS
10.0000 mg | ORAL_TABLET | Freq: Every day | ORAL | Status: DC
  Administered 2024-03-07 – 2024-03-10 (×4): 10 mg via ORAL
  Filled 2024-03-07 (×4): qty 1

## 2024-03-07 MED ORDER — CHLORHEXIDINE GLUCONATE CLOTH 2 % EX PADS
6.0000 | MEDICATED_PAD | Freq: Every day | CUTANEOUS | Status: DC
  Administered 2024-03-07 – 2024-03-10 (×4): 6 via TOPICAL

## 2024-03-07 MED ORDER — INSULIN ASPART 100 UNIT/ML IJ SOLN
10.0000 [IU] | Freq: Once | INTRAMUSCULAR | Status: AC
  Administered 2024-03-07: 10 [IU] via INTRAVENOUS
  Filled 2024-03-07: qty 1

## 2024-03-07 MED ORDER — DEXTROSE IN LACTATED RINGERS 5 % IV SOLN: INTRAVENOUS | Status: DC

## 2024-03-07 MED ORDER — CALCIUM GLUCONATE 10 % IV SOLN
1.0000 g | Freq: Once | INTRAVENOUS | Status: AC
  Administered 2024-03-07: 1 g via INTRAVENOUS
  Filled 2024-03-07: qty 10

## 2024-03-07 MED ORDER — ATROPINE SULFATE 1 MG/10ML IJ SOSY
1.0000 mg | PREFILLED_SYRINGE | Freq: Once | INTRAMUSCULAR | Status: AC
  Administered 2024-03-07: 1 mg via INTRAVENOUS
  Filled 2024-03-07: qty 10

## 2024-03-07 MED ORDER — PANTOPRAZOLE SODIUM 40 MG PO TBEC
40.0000 mg | DELAYED_RELEASE_TABLET | Freq: Every day | ORAL | Status: DC
  Administered 2024-03-08 – 2024-03-11 (×4): 40 mg via ORAL
  Filled 2024-03-07 (×4): qty 1

## 2024-03-07 MED ORDER — DEXTROSE 50 % IV SOLN: 0.0000 mL | INTRAVENOUS | Status: DC | PRN

## 2024-03-07 MED ORDER — SODIUM CHLORIDE 0.9 % IV BOLUS
1000.0000 mL | Freq: Once | INTRAVENOUS | Status: AC
  Administered 2024-03-07: 1000 mL via INTRAVENOUS

## 2024-03-07 MED ORDER — ORAL CARE MOUTH RINSE: 15.0000 mL | OROMUCOSAL | Status: DC | PRN

## 2024-03-07 MED ORDER — ATROPINE SULFATE 1 MG/ML IV SOLN: 1.0000 mg | Freq: Once | INTRAVENOUS | Status: DC

## 2024-03-07 NOTE — Assessment & Plan Note (Signed)
  Hypertension Holding his home ramipril

## 2024-03-07 NOTE — Assessment & Plan Note (Signed)
  Failure to thrive Patient's daughter reports he is unable to care for himself as independent living have consulted transition of care

## 2024-03-07 NOTE — Assessment & Plan Note (Signed)
 Suspected hyperosmolar hyperglycemic state versus simple hyperglycemia Patient is a type I diabetic as per notes although was on metformin  at home so unclear and insulin , holding the metformin  glucose was 603 ketones in the urine, CO2 was 22 on the BMP without an anion gap Plan: Endo tool insulin  drip targeting 140-180 HHS protocol with full order set supporting fluids and electrolyte management, will obtain a VBG, will obtain serum ketones and serum osmolality

## 2024-03-07 NOTE — Assessment & Plan Note (Signed)
 Elevated troponin Troponin 38, 39 likely secondary to the AKI on the CKD no ischemic chest pain although history of coronary artery disease status post stenting will obtain echocardiogram to rule out any regional wall motion abnormalities

## 2024-03-07 NOTE — Assessment & Plan Note (Signed)
 Cellulitis Of the right lower extremity no leukocytosis with WBC 5.7 afebrile we will treat with cefazolin 

## 2024-03-07 NOTE — Assessment & Plan Note (Signed)
   Acute metabolic encephalopathy Likely secondary to his underlying hyperglycemia possible contribution from his bradycardia increased from baseline as per family member given he is anticoagulated we will obtain a CT of the head also obtaining B12 levels TSH and VDRL

## 2024-03-07 NOTE — ED Triage Notes (Signed)
 Pt in via [facility] from home for a BS of 595. Per EMS they originally came out for a heat issue but discovered high BS. Per EMS Mobitz type 1 with rate between 40-60. No complinats of pain. Per EMS he has been altered with driving into neighbors driveway and attempting to get into their house.

## 2024-03-07 NOTE — Assessment & Plan Note (Signed)
 Obstructive sleep apnea. CPAP at night.

## 2024-03-07 NOTE — Assessment & Plan Note (Signed)
  Secondary hypercoagulable state Due to atrial fibrillation by history also history of DVT will continue with Eliquis  2.5 mg twice a day

## 2024-03-07 NOTE — Assessment & Plan Note (Signed)
  Hyponatremia Pseudohyponatremia in the setting of hyperglycemia will follow as hyperglycemia resolved

## 2024-03-07 NOTE — Assessment & Plan Note (Signed)
 Gastroesophageal reflux disease Continue his proton pump inhibitor

## 2024-03-07 NOTE — ED Provider Notes (Signed)
 [facility] Provider Note    Event Date/Time   First MD Initiated Contact with Patient 03/07/24 1500     (approximate)   History   High blood sugar   HPI  Carl Yang is a 83 y.o. male  who presents to the emergency department today for somewhat unclear reasons.  Unfortunately patient does suffer from dementia so cannot give a good history.  He is not sure why staff at his living facility called 911 today.  EMS did find patient to be hyperglycemic.  Patient does state he has a history of diabetes and takes injectable insulin .  He denies any pain.     Physical Exam   Triage Vital Signs: ED Triage Vitals  Encounter Vitals Group     BP 03/07/24 1505 (!) 117/43     Girls Systolic BP Percentile --      Girls Diastolic BP Percentile --      Boys Systolic BP Percentile --      Boys Diastolic BP Percentile --      Pulse Rate 03/07/24 1505 (!) 48     Resp 03/07/24 1505 14     Temp 03/07/24 1505 97.8 F (36.6 C)     Temp Source 03/07/24 1505 Oral     SpO2 03/07/24 1505 95 %     Weight --      Height --      Head Circumference --      Peak Flow --      Pain Score 03/07/24 1502 0     Pain Loc --      Pain Education --      Exclude from Growth Chart --     Most recent vital signs: Vitals:   03/07/24 1505  BP: (!) 117/43  Pulse: (!) 48  Resp: 14  Temp: 97.8 F (36.6 C)  SpO2: 95%   General: Awake, alert, not completely oriented. CV:  Good peripheral perfusion. Bradycardia, irregular rhythm. Resp:  Normal effort. Lungs clear. Abd:  No distention.    ED Results / Procedures / Treatments   Labs (all labs ordered are listed, but only abnormal results are displayed) Labs Reviewed  BASIC METABOLIC PANEL WITH GFR - Abnormal; Notable for the following components:      Result Value   Sodium 126 (*)    Potassium 6.2 (*)    Chloride 95 (*)    Glucose, Bld 603 (*)    BUN 38 (*)    Creatinine, Ser 1.62 (*)    Calcium  8.4 (*)    GFR,  Estimated 42 (*)    All other components within normal limits  CBC - Abnormal; Notable for the following components:   RBC 3.58 (*)    Hemoglobin 11.0 (*)    HCT 33.0 (*)    All other components within normal limits  URINALYSIS, ROUTINE W REFLEX MICROSCOPIC - Abnormal; Notable for the following components:   Color, Urine YELLOW (*)    APPearance CLEAR (*)    Glucose, UA >=500 (*)    Ketones, ur 5 (*)    All other components within normal limits  CBG MONITORING, ED - Abnormal; Notable for the following components:   Glucose-Capillary 548 (*)    All other components within normal limits  TROPONIN I (HIGH SENSITIVITY) - Abnormal; Notable for the following components:   Troponin I (High Sensitivity) 38 (*)    All other components within normal limits  TROPONIN I (HIGH SENSITIVITY) - Abnormal; Notable  for the following components:   Troponin I (High Sensitivity) 39 (*)    All other components within normal limits  MAGNESIUM   TSH  BLOOD GAS, VENOUS  OSMOLALITY  BASIC METABOLIC PANEL WITH GFR  BASIC METABOLIC PANEL WITH GFR  BASIC METABOLIC PANEL WITH GFR  BASIC METABOLIC PANEL WITH GFR  HEMOGLOBIN A1C  BETA-HYDROXYBUTYRIC ACID  CBG MONITORING, ED     EKG  I, Guadalupe Eagles, attending physician, personally viewed and interpreted this EKG  EKG Time: 1504 Rate: 39 Rhythm: sinus bradycardia 2nd degree av block type 1 Axis: normal Intervals: qtc 397 QRS: narrow ST changes: no st elevation Impression: abnormal ekg   RADIOLOGY None   PROCEDURES:  Critical Care performed: Yes  CRITICAL CARE Performed by: Guadalupe Eagles   Total critical care time: 30 minutes  Critical care time was exclusive of separately billable procedures and treating other patients.  Critical care was necessary to treat or prevent imminent or life-threatening deterioration.  Critical care was time spent personally by me on the following activities: development of treatment plan with patient  and/or surrogate as well as nursing, discussions with consultants, evaluation of patient's response to treatment, examination of patient, obtaining history from patient or surrogate, ordering and performing treatments and interventions, ordering and review of laboratory studies, ordering and review of radiographic studies, pulse oximetry and re-evaluation of patient's condition.   Procedures    MEDICATIONS ORDERED IN ED: Medications - No data to display   IMPRESSION / MDM / ASSESSMENT AND PLAN / ED COURSE  I reviewed the triage vital signs and the nursing notes.                              Differential diagnosis includes, but is not limited to, DKA, hyperglycemia, electrolyte abnormality, ACS, arrhythmia  Patient's presentation is most consistent with acute presentation with potential threat to life or bodily function.   The patient is on the cardiac monitor to evaluate for evidence of arrhythmia and/or significant heart rate changes.  Patient presented to the emergency department today via EMS.  Somewhat unclear reason why EMS was called to his living facility.  The patient himself has no complaints.  EMS however did find patient be hyperglycemic.  Blood work here confirms hyperglycemia however does not show any elevated anion gap.  However initial EKG was concerning for arrhythmia.  Here on the monitor did have concerns for possible complete heart block however was not captured on EKG.  Discussed with Dr. Omega who evaluated the EKGs.  Patient's blood pressure is normal and at this time did not feel patient required temporary wire. Blood work showed slightly elevated troponin initially however repeat is without significant change. Discussed with Dr. Ezzard with the hospitalist service who will evaluate for admission.    FINAL CLINICAL IMPRESSION(S) / ED DIAGNOSES   Final diagnoses:  Cardiac arrhythmia, unspecified cardiac arrhythmia type  Hyperglycemia       Note:  This  document was prepared using Dragon voice recognition software and may include unintentional dictation errors.    Eagles Guadalupe, MD 03/07/24 [phone]

## 2024-03-07 NOTE — Assessment & Plan Note (Addendum)
    Bradycardia, ventricular rates in the 30s to 40s Independently reviewed and interpreted the twelve-lead ECG from 4:39 today it looks like Mobitz type II with a QTc of 483 and a ventricular rate of 54.  Interventional cards on-call consulted by the ED advised no pacing required and furthermore this was found to be not within the patient's goals of care, suspect this is multifactorial with the patient's use of Aricept, hyperkalemia and possible underlying heart block During examination patient's heart rate was going into the 30s and again acute on chronic confusion so we will acutely altered mental status different from baseline although he did not have hypotension or signs of shock denies any ischemic chest discomfort does have signs of heart failure with the lower extremity swelling accordingly elected to give 1 mg IV atropine  as per the ACLS guidelines given the acute different from baseline confusion and the signs of heart failure Plan: Trial of 1 mg IV atropine , telemetry, admission to stepdown, echocardiogram, cardiology consultation, hold donepezil, checking TSH, correcting the hyperkalemia (pacing transcutanous, temp or perm implanted not within GOC), Will consider dopamine or epinephrine  infusion if this were to progress although SBP remains stable

## 2024-03-07 NOTE — Assessment & Plan Note (Signed)
  Hyperkalemia Potassium is 6.2 patient is on ACE inhibitor which we will hold he also has AKI on CKD likely multifactorial received 10 units of insulin  and calcium  in the emergency department we will follow this up with Lokelma  3 times daily, albuterol  which will assist with intracellular shift of the potassium as well as patient's bradycardia

## 2024-03-07 NOTE — ED Notes (Signed)
 Glucose reported critical 603 ml/dl

## 2024-03-07 NOTE — H&P (Addendum)
 History and Physical    Patient: Carl Yang FMW:[id number] DOB: 1941/08/20 DOA: 03/07/2024 DOS: the patient was seen and examined on 03/07/2024 PCP: Carl Amis, MD  Patient coming from: ALF/ILF  Chief Complaint: Increased Confusion  HPI:  83 year old gentleman with a past medical history of mixed dementia, type 1 diabetes mellitus, deep venous thrombosis, atrial fibrillation coronary artery disease status post PCI, hypertension, chronic kidney disease stage III who presented from his independent living facility due to increasing confusion, the patient's daughter Carl Yang at the bedside assists with history.  The patient insulin  pump was and recently has been on insulin  pen however the compliance is likely dubious.  The patient denied any fever or chills denies any open wounds including his legs and reports his right lower extremity edema is slightly larger than baseline and was unsure regarding the new erythema.  He denies any bleeding symptoms.  The patient presented to the emergency department where he was found to be hyperglycemic with blood glucose in the 600s hyperkalemic with potassium 6.2, creatinine 1.62 and bradycardia dynamic but rates within the 30s to 40s when looking at the bedside manual defibrillator.  He has remained hemodynamically stable with systolic blood pressure 1 10-1 40s since presentation to the emergency department.  Past medical records reviewed and summarized patient had an office visit on 01/29/2024 where he was diagnosed with mixed dementia was a new diagnosis he also had a previous admission December 09, 2023 to [facility] where he is admitted with very similar symptoms of hyperglycemia and confusion the patient's daughter reports he is more confused than his already confused baseline patient unable to see the day of the week his age etc.  The patient's daughter was brought to tears when exploring the patient's past history of DVT as they had a daughter die  of pulmonary embolism unresponsive to this diagnosis  Case discussed with the emergency department provider and the patient subsequently admitted.  Signout from the emergency department provider is that this patient's case was discussed with Dr. Ammon show the STEMI on-call reviewed the ECG and did not feel this is high degree heart block or required pacing at this time.  Review of Systems: Cannot Obtain due to mental status Past Medical History:  Diagnosis Date   Arthritis    Atrial fibrillation (HCC)    Cancer (HCC)    Melanoma   Chronic anticoagulation    Warfarin   Complication of anesthesia    After surgery at [facility], the patient coded during the night.   Coronary artery disease    Stents   Diabetes mellitus without complication (HCC)    DVT (deep venous thrombosis) (HCC)    Right lower limb   Dyspnea    GERD (gastroesophageal reflux disease)    Insulin  pump in place    Myocardial infarction (HCC)    1998, 2003   Neuromuscular disorder (HCC)    Peripheral Neuropathy r/t DM   Peripheral vascular disease (HCC)    Diabetes   Sleep apnea    Uses CPAP   Past Surgical History:  Procedure Laterality Date   AMPUTATION TOE Left    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   AMPUTATION TOE Left 10/09/2018   Procedure: TOE IPJ 2ND TOE LEFT;  Surgeon: Carl Yang, DPM;  Location: [facility];  Service: Podiatry;  Laterality: Left;  IVA LOCAL Diabeticv - insulin  pump   BACK SURGERY     L4 & L5 Fusion   BONE EXCISION Left  07/08/2020   Procedure: EXCISION BONE METATARSAL LEFT;  Surgeon: Carl Yang, DPM;  Location: [facility];  Service: Podiatry;  Laterality: Left;   CARDIAC CATHETERIZATION  2003   CORONARY ANGIOPLASTY WITH STENT PLACEMENT     stents x 2   DIAGNOSTIC LAPAROSCOPY     EYE SURGERY Bilateral    cataracts   INCISION AND DRAINAGE OF WOUND Left 06/07/2023   Procedure: IRRIGATION AND DEBRIDEMENT BONE OF LEFT HEEL;  Surgeon: Carl Yang, DPM;  Location: [facility]  [facility];  Service: Orthopedics/Podiatry;  Laterality: Left;   LOWER EXTREMITY ANGIOGRAPHY Left 06/06/2023   Procedure: Lower Extremity Angiography;  Surgeon: Carl Cordella MATSU, MD;  Location: [facility];  Service: Cardiovascular;  Laterality: Left;   PARTIAL KNEE ARTHROPLASTY Left 02/09/2020   Procedure: UNICOMPARTMENTAL KNEE;  Surgeon: Carl Norleen PARAS, MD;  Location: [facility];  Service: Orthopedics;  Laterality: Left;   TONSILLECTOMY     TOTAL KNEE ARTHROPLASTY Right 07/15/2018   Procedure: TOTAL KNEE ARTHROPLASTY;  Surgeon: Carl Norleen PARAS, MD;  Location: [facility] OR[facility];  Service: Orthopedics;  Laterality: Right;   TOTAL KNEE REVISION Right 05/05/2019   Procedure: TOTAL KNEE REVISION;  Surgeon: Carl Norleen PARAS, MD;  Location: [facility] OR[facility];  Service: Orthopedics;  Laterality: Right;   Social History:  reports that he has never smoked. He has never used smokeless tobacco. He reports current alcohol use of about 1.0 standard drink of alcohol per week. He reports that he does not use drugs.  No Known Allergies  History reviewed. No pertinent family history.  Prior to Admission medications   Medication Sig Start Date End Date Taking? Authorizing Provider  ELIQUIS  2.5 MG TABS tablet Take 2.5 mg by mouth 2 (two) times daily.    [provider]  esomeprazole  (NEXIUM ) 40 MG capsule Take 1 capsule (40 mg total) by mouth daily. Patient taking differently: Take 40 mg by mouth every morning. 07/30/18   Carl Barnie RAMAN, NP  fexofenadine (ALLEGRA) 180 MG tablet Take 180 mg by mouth every morning.  05/09/18   [provider]  rosuvastatin  (CRESTOR ) 40 MG tablet Take 40 mg by mouth daily. 04/18/23   [provider]    Physical Exam: Vitals:   03/07/24 1505 03/07/24 1530 03/07/24 1730 03/07/24 1731  BP: (!) 117/43 (!) 125/46 (!) 146/51 (!) 146/51  Pulse: (!) 48 (!) 108 (!) 36 (!) 36  Resp: 14 20 16 20   Temp: 97.8 F (36.6 C)   97.8 F (36.6 C)  TempSrc: Oral   Oral  SpO2: 95% 98%  100% 100%  Patient seen and examined in room 26 in the emergency department with the patient's daughter at the bedside Constitutional:  Vital Signs as per [facility] than three noted] No Acute Distress Eyes:  Pink Conjunctiva and no Ptosis Neck:     Trachea Midline, Neck Symmetric Respiratory:   Respiratory Effort Normal: No Use of Respiratory Muscles,No  Intercostal Retractions             Lungs Clear to Auscultation Bilaterally Cardiovascular:   Heart Auscultated: Regular Regular bradycardia with systolic murmur right lower extremity pitting edema to knee with erythema , bilateral lower extremitity edema R>L swelling and signs of hemosiderin deposition and stigmata of venous stasis Gastrointestinal:  Abdomen soft and nontender without palpable masses, guarding or rebound  No Palpable Splenomegaly or Hepatomegaly Neurologic:  Moves all 4 extremities to gravity spontaneously no focal neurology Psychiatric:  Patient oriented to person only unsure of time unsure of his age  and repeatedly reports history his daughter is correcting  Data Reviewed: Labs, Radiology, ECG as detailed in HPI and A/P   Assessment and Plan: * Bradycardia    Bradycardia, ventricular rates in the 30s to 40s Independently reviewed and interpreted the twelve-lead ECG from 4:39 today it looks like Mobitz type II with a QTc of 483 and a ventricular rate of 54.  Interventional cards on-call consulted by the ED advised no pacing required and furthermore this was found to be not within the patient's goals of care, suspect this is multifactorial with the patient's use of Aricept, hyperkalemia and possible underlying heart block During examination patient's heart rate was going into the 30s and again acute on chronic confusion so we will acutely altered mental status different from baseline although he did not have hypotension or signs of shock denies any ischemic chest discomfort does have signs of heart failure with the  lower extremity swelling accordingly elected to give 1 mg IV atropine  as per the ACLS guidelines given the acute different from baseline confusion and the signs of heart failure Plan: Trial of 1 mg IV atropine , telemetry, admission to stepdown, echocardiogram, cardiology consultation, hold donepezil, checking TSH, correcting the hyperkalemia (pacing transcutanous, temp or perm implanted not within GOC), Will consider dopamine or epinephrine  infusion if this were to progress although SBP remains stable  Hyperosmolar hyperglycemic state (HHS) (HCC) Suspected hyperosmolar hyperglycemic state versus simple hyperglycemia Patient is a type I diabetic as per notes although was on metformin  at home so unclear and insulin , holding the metformin  glucose was 603 ketones in the urine, CO2 was 22 on the BMP without an anion gap Plan: Endo tool insulin  drip targeting 140-180 HHS protocol with full order set supporting fluids and electrolyte management, will obtain a VBG, will obtain serum ketones and serum osmolality  AKI (acute kidney injury) (HCC) Acute kidney injury on chronic kidney disease stage III Creatinine is 1.62 patient's baseline is 1.38 on 06/2023 received 1 L fluid in the emergency department of sodium chloride  suspect this is likely osmotic diuresis and ACE inhibitor effect we will obtain an ultrasound to rule out any hydronephrosis  Hyperkalemia  Hyperkalemia Potassium is 6.2 patient is on ACE inhibitor which we will hold he also has AKI on CKD likely multifactorial received 10 units of insulin  and calcium  in the emergency department we will follow this up with Lokelma  3 times daily, albuterol  which will assist with intracellular shift of the potassium as well as patient's bradycardia, CPK although no evidence of compartment syndrome on exam (on statin)  Dementia (HCC) Mixed dementia Holding Aricept which was prescribed on 01/29/2024  Failure to thrive in adult  Failure to thrive Patient's  daughter reports he is unable to care for himself as independent living have consulted transition of care  Cellulitis Cellulitis Of the right lower extremity no leukocytosis with WBC 5.7 afebrile we will treat with cefazolin   Acute metabolic encephalopathy   Acute metabolic encephalopathy Likely secondary to his underlying hyperglycemia possible contribution from his bradycardia increased from baseline as per family member given he is anticoagulated we will obtain a CT of the head also obtaining B12 levels TSH and VDRL  Secondary hypercoagulability disorder (HCC)  Secondary hypercoagulable state Due to atrial fibrillation by history also history of DVT will continue with Eliquis  2.5 mg twice a day   Hyponatremia  Hyponatremia Pseudohyponatremia in the setting of hyperglycemia will follow as hyperglycemia resolved  Elevated troponin Elevated troponin Troponin 38, 39 likely secondary to the  AKI on the CKD no ischemic chest pain although history of coronary artery disease status post stenting will obtain echocardiogram to rule out any regional wall motion abnormalities  GERD (gastroesophageal reflux disease) Gastroesophageal reflux disease Continue his proton pump inhibitor   OSA (obstructive sleep apnea)  Obstructive sleep apnea CPAP at night  Essential hypertension  Hypertension Holding his home ramipril       Advance Care Planning:   Code Status: Limited: Do not attempt resuscitation (DNR) -DNR-LIMITED -Do Not Intubate/DNI    The patient who does not have decision-making passed capacity reported that he wanted to be DNR/DNI would not be interested in a pacemaker transcutaneous pacing or defibrillation or intubation if it was required to save his life and the patient's daughter his surrogate decision maker Carl Yang at the bedside confirmed the same (no transcutaneous or implantable pacing or defibrillation) this was confirmed on multiple occasions although he is okay  with pharmacological interventions as well as the daughter.  Patient admitted to stepdown  Consults:  S/T Dr. Florencio  (Cards on call) at 8:06 PM: Continue/ agrees the current plan of care reevaluate after hyperkalemia resolves EMR Consult Placed  Family Communication: Daughter at Bedside  Severity of Illness: The appropriate patient status for this patient is INPATIENT. Inpatient status is judged to be reasonable and necessary in order to provide the required intensity of service to ensure the patient's safety. The patient's presenting symptoms, physical exam findings, and initial radiographic and laboratory data in the context of their chronic comorbidities is felt to place them at high risk for further clinical deterioration. Furthermore, it is not anticipated that the patient will be medically stable for discharge from the hospital within 2 midnights of admission.   * I certify that at the point of admission it is my clinical judgment that the patient will require inpatient hospital care spanning beyond 2 midnights from the point of admission due to high intensity of service, high risk for further deterioration and high frequency of surveillance required.*  Upon my evaluation, this patient had a high probability of imminent or life-threatening deterioration due to hyperglycemia acute on  chronic kidney injury, life-threatening hyperkalemia and bradycardia, which required my direct attention, intervention, and personal management. I have personally provided 70 minutes of critical care time exclusive of time spent on separately billable procedures. Time includes review of laboratory data, radiology results, discussion with consultants, and monitoring for potential decompensation. Interventions were performed as documented above.   8:25 PM Re-evaluated patient post atropine  on neb HR 80s more alert, non-toxic, daughter at bedside updated  10:10 PM: Serum osmolality 318 normal serum ketones this  was hyperglycemia and hyperosmolar hyperglycemic state ruled out this was hyperglycemia uncontrolled approaching(HHS <320)  Author: Prentice JAYSON Lowenstein, MD 03/07/2024 8:08 PM  For on call review [email or web address].

## 2024-03-07 NOTE — Assessment & Plan Note (Signed)
 Acute kidney injury on chronic kidney disease stage III Creatinine is 1.62 patient's baseline is 1.38 on 06/2023 received 1 L fluid in the emergency department of sodium chloride  suspect this is likely osmotic diuresis and ACE inhibitor effect we will obtain an ultrasound to rule out any hydronephrosis

## 2024-03-07 NOTE — Assessment & Plan Note (Signed)
 Mixed dementia Holding Aricept which was prescribed on 01/29/2024

## 2024-03-08 ENCOUNTER — Inpatient Hospital Stay (HOSPITAL_COMMUNITY)
Admit: 2024-03-08 | Discharge: 2024-03-08 | Disposition: A | Discharge status: Home / Self Care | Attending: Internal Medicine | Admitting: Internal Medicine

## 2024-03-08 DIAGNOSIS — R001 Bradycardia, unspecified: Secondary | ICD-10-CM | POA: Diagnosis not present

## 2024-03-08 LAB — GLUCOSE, CAPILLARY
Glucose-Capillary: 110 mg/dL — ABNORMAL HIGH (ref 70–99)
Glucose-Capillary: 121 mg/dL — ABNORMAL HIGH (ref 70–99)
Glucose-Capillary: 129 mg/dL — ABNORMAL HIGH (ref 70–99)
Glucose-Capillary: 130 mg/dL — ABNORMAL HIGH (ref 70–99)
Glucose-Capillary: 141 mg/dL — ABNORMAL HIGH (ref 70–99)
Glucose-Capillary: 144 mg/dL — ABNORMAL HIGH (ref 70–99)
Glucose-Capillary: 160 mg/dL — ABNORMAL HIGH (ref 70–99)
Glucose-Capillary: 194 mg/dL — ABNORMAL HIGH (ref 70–99)
Glucose-Capillary: 205 mg/dL — ABNORMAL HIGH (ref 70–99)
Glucose-Capillary: 244 mg/dL — ABNORMAL HIGH (ref 70–99)
Glucose-Capillary: 281 mg/dL — ABNORMAL HIGH (ref 70–99)
Glucose-Capillary: 310 mg/dL — ABNORMAL HIGH (ref 70–99)
Glucose-Capillary: 323 mg/dL — ABNORMAL HIGH (ref 70–99)
Glucose-Capillary: 367 mg/dL — ABNORMAL HIGH (ref 70–99)

## 2024-03-08 LAB — BASIC METABOLIC PANEL WITH GFR
Anion gap: 8 (ref 5–15)
BUN: 32 mg/dL — ABNORMAL HIGH (ref 8–23)
CO2: 26 mmol/L (ref 22–32)
Calcium: 8.8 mg/dL — ABNORMAL LOW (ref 8.9–10.3)
Chloride: 102 mmol/L (ref 98–111)
Creatinine, Ser: 1.38 mg/dL — ABNORMAL HIGH (ref 0.61–1.24)
GFR, Estimated: 51 mL/min — ABNORMAL LOW (ref >60)
Glucose, Bld: 164 mg/dL — ABNORMAL HIGH (ref 70–99)
Potassium: 3.8 mmol/L (ref 3.5–5.1)
Sodium: 136 mmol/L (ref 135–145)

## 2024-03-08 LAB — COMPREHENSIVE METABOLIC PANEL WITH GFR
ALT: 20 U/L (ref 0–44)
AST: 24 U/L (ref 15–41)
Albumin: 2.9 g/dL — ABNORMAL LOW (ref 3.5–5.0)
Alkaline Phosphatase: 50 U/L (ref 38–126)
Anion gap: 6 (ref 5–15)
BUN: 30 mg/dL — ABNORMAL HIGH (ref 8–23)
CO2: 25 mmol/L (ref 22–32)
Calcium: 8.6 mg/dL — ABNORMAL LOW (ref 8.9–10.3)
Chloride: 104 mmol/L (ref 98–111)
Creatinine, Ser: 1.32 mg/dL — ABNORMAL HIGH (ref 0.61–1.24)
GFR, Estimated: 54 mL/min — ABNORMAL LOW (ref >60)
Glucose, Bld: 137 mg/dL — ABNORMAL HIGH (ref 70–99)
Potassium: 3.9 mmol/L (ref 3.5–5.1)
Sodium: 135 mmol/L (ref 135–145)
Total Bilirubin: 0.7 mg/dL (ref 0.0–1.2)
Total Protein: 5.1 g/dL — ABNORMAL LOW (ref 6.5–8.1)

## 2024-03-08 LAB — HEMOGLOBIN A1C
Hgb A1c MFr Bld: 13.1 % — ABNORMAL HIGH (ref 4.8–5.6)
Mean Plasma Glucose: 329.27 mg/dL

## 2024-03-08 LAB — ECHOCARDIOGRAM COMPLETE
AR max vel: 3 cm2
AV Peak grad: 7.1 mmHg
Ao pk vel: 1.33 m/s
Area-P 1/2: 2.74 cm2
Height: 74 in
S' Lateral: 3.4 cm
Weight: 3280.44 [oz_av]

## 2024-03-08 LAB — CBC
HCT: 31.6 % — ABNORMAL LOW (ref 39.0–52.0)
Hemoglobin: 10.4 g/dL — ABNORMAL LOW (ref 13.0–17.0)
MCH: 30.4 pg (ref 26.0–34.0)
MCHC: 32.9 g/dL (ref 30.0–36.0)
MCV: 92.4 fL (ref 80.0–100.0)
Platelets: 145 10*3/uL — ABNORMAL LOW (ref 150–400)
RBC: 3.42 MIL/uL — ABNORMAL LOW (ref 4.22–5.81)
RDW: 14.8 % (ref 11.5–15.5)
WBC: 7.3 10*3/uL (ref 4.0–10.5)
nRBC: 0 % (ref 0.0–0.2)

## 2024-03-08 LAB — VITAMIN B12: Vitamin B-12: 747 pg/mL (ref 180–914)

## 2024-03-08 LAB — MRSA NEXT GEN BY PCR, NASAL: MRSA by PCR Next Gen: NOT DETECTED

## 2024-03-08 MED ORDER — ALUM & MAG HYDROXIDE-SIMETH 200-200-20 MG/5ML PO SUSP
30.0000 mL | ORAL | Status: DC | PRN
  Administered 2024-03-08: 30 mL via ORAL
  Filled 2024-03-08: qty 30

## 2024-03-08 MED ORDER — INSULIN ASPART 100 UNIT/ML IJ SOLN
0.0000 [IU] | Freq: Three times a day (TID) | INTRAMUSCULAR | Status: DC
  Administered 2024-03-08: 15 [IU] via SUBCUTANEOUS
  Administered 2024-03-09: 8 [IU] via SUBCUTANEOUS
  Administered 2024-03-09: 15 [IU] via SUBCUTANEOUS
  Administered 2024-03-09: 8 [IU] via SUBCUTANEOUS
  Administered 2024-03-10 (×2): 3 [IU] via SUBCUTANEOUS
  Administered 2024-03-10: 8 [IU] via SUBCUTANEOUS
  Administered 2024-03-11: 3 [IU] via SUBCUTANEOUS
  Administered 2024-03-11: 5 [IU] via SUBCUTANEOUS
  Filled 2024-03-08 (×4): qty 1
  Filled 2024-03-08: qty 8
  Filled 2024-03-08: qty 1
  Filled 2024-03-08: qty 8
  Filled 2024-03-08: qty 1

## 2024-03-08 MED ORDER — INSULIN ASPART 100 UNIT/ML IJ SOLN
0.0000 [IU] | INTRAMUSCULAR | Status: DC
  Administered 2024-03-08: 7 [IU] via SUBCUTANEOUS
  Filled 2024-03-08: qty 1

## 2024-03-08 MED ORDER — INSULIN GLARGINE-YFGN 100 UNIT/ML ~~LOC~~ SOLN
30.0000 [IU] | Freq: Every day | SUBCUTANEOUS | Status: DC
  Administered 2024-03-08 – 2024-03-11 (×4): 30 [IU] via SUBCUTANEOUS
  Filled 2024-03-08 (×4): qty 0.3

## 2024-03-08 MED ORDER — INSULIN ASPART 100 UNIT/ML IJ SOLN
0.0000 [IU] | Freq: Every day | INTRAMUSCULAR | Status: DC
  Administered 2024-03-08: 2 [IU] via SUBCUTANEOUS
  Administered 2024-03-11: 3 [IU] via SUBCUTANEOUS
  Filled 2024-03-08: qty 2
  Filled 2024-03-08: qty 1

## 2024-03-08 NOTE — Progress Notes (Signed)
 PROGRESS NOTE    Carl Yang  FMW:[id number] DOB: Jul 18, 1941 DOA: 03/07/2024 PCP: Carl Yang    Brief Narrative:  83 year old gentleman with a past medical history of mixed dementia, type 1 diabetes mellitus, deep venous thrombosis, atrial fibrillation coronary artery disease status post PCI, hypertension, chronic kidney disease stage III who presented from his independent living facility due to increasing confusion, the patient's daughter Carl Yang at the bedside assists with history. The patient insulin  pump was and recently has been on insulin  pen however the compliance is likely dubious. The patient denied any fever or chills denies any open wounds including his legs and reports his right lower extremity edema is slightly larger than baseline and was unsure regarding the new erythema.    Assessment and Plan: * Bradycardia - ventricular rates in the 30s to 40s -Monitor closely now that potassium has decreased - Cardiology consult: Do not recommend temporary or permanent pacemaker at this stage because of comorbid disease would recommend conservative management as per family's request   Hyperosmolar hyperglycemic state (HHS) (HCC) Suspected hyperosmolar hyperglycemic state versus simple hyperglycemia Patient is a type I diabetic as per notes although was on metformin  at home so unclear and insulin , holding the metformin  as glucose was 603 and ketones in the urine, CO2 was 22 on the BMP without an anion gap -Initially on Endo tool but has since been transition to subcu insulin  and sliding scale - Per family patient forgets to take his insulin  on a regular basis  AKI (acute kidney injury) (HCC) -Creatinine trending down with IV fluids  Hyperkalemia -Resolved   Failure to thrive in adult/dementia Patient's daughter reports he is unable to care for himself as independent living have consulted transition of care for transition to either ALF or skilled nursing  portion  Cellulitis Mild if any of the right lower extremity  -no leukocytosis with WBC 5.7 afebrile  -on cefazolin  - Low threshold to stop antibiotics completely  Secondary hypercoagulability disorder (HCC) Due to atrial fibrillation by history also history of DVT -continue with Eliquis  2.5 mg twice a day   Hyponatremia Pseudohyponatremia in the setting of hyperglycemia - Resolved  Elevated troponin Troponin 38, 39 likely secondary to the AKI on the CKD no ischemic chest pain although history of coronary artery disease status post stenting  -echo-EF 55 to 60%  GERD (gastroesophageal reflux disease) Continue his proton pump inhibitor   OSA (obstructive sleep apnea) CPAP at night  Essential hypertension Holding his home ramipril        DVT prophylaxis: apixaban  (ELIQUIS ) tablet 2.5 mg Start: 03/07/24 2200 apixaban  (ELIQUIS ) tablet 2.5 mg    Code Status: Limited: Do not attempt resuscitation (DNR) -DNR-LIMITED -Do Not Intubate/DNI  Family Communication: Daughter at bedside  Disposition Plan:  Level of care: Stepdown Status is: Inpatient  Suspect patient can transfer out of the ICU to a telemetry bed later today    Consultants:  Cardiology   Subjective: Patient acknowledges he needs higher level care as he forgets to take his medications but he is sad at losing his ability to drive  Objective: Vitals:   03/08/24 0800 03/08/24 0900 03/08/24 1000 03/08/24 1100  BP: 116/65 (!) 83/63 101/81 (!) 115/48  Pulse: (!) 40 (!) 45 63 (!) 47  Resp: 18 13 17 19   Temp: 98 F (36.7 C)     TempSrc: Oral     SpO2: 96% 95% (!) 89% 94%  Weight:      Height:  Intake/Output Summary (Last 24 hours) at 03/08/2024 1119 Last data filed at 03/08/2024 1030 Gross per 24 hour  Intake 4700.3 ml  Output 750 ml  Net 3950.3 ml   Filed Weights   03/07/24 2010 03/07/24 2050 03/08/24 0725  Weight: 88.5 kg 91.2 kg 93 kg    Examination:   General: Appearance:      Overweight male in no acute distress     Lungs:     respirations unlabored  Heart:    Bradycardic. Carl Yang       Neurologic:   Awake, alert       Data Reviewed: I have personally reviewed following labs and imaging studies  CBC: Recent Labs  Lab 03/07/24 1511 03/08/24 0446  WBC 5.7 7.3  HGB 11.0* 10.4*  HCT 33.0* 31.6*  MCV 92.2 92.4  PLT 156 145*   Basic Metabolic Panel: Recent Labs  Lab 03/07/24 1511 03/07/24 1954 03/07/24 2300 03/08/24 0315 03/08/24 0446  NA 126* 132* 135 136 135  K 6.2* 4.9 3.8 3.8 3.9  CL 95* 98 102 102 104  CO2 22 28 24 26 25   GLUCOSE 603* 529* 386* 164* 137*  BUN 38* 37* 35* 32* 30*  CREATININE 1.62* 1.72* 1.54* 1.38* 1.32*  CALCIUM  8.4* 9.0 8.5* 8.8* 8.6*  MG 1.9  --   --   --   --    GFR: Estimated Creatinine Clearance: 51 mL/min (A) (by C-G formula based on SCr of 1.32 mg/dL (H)). Liver Function Tests: Recent Labs  Lab 03/07/24 1954 03/08/24 0446  AST 30 24  ALT 27 20  ALKPHOS 62 50  BILITOT 0.9 0.7  PROT 5.8* 5.1*  ALBUMIN 3.4* 2.9*   No results for input(s): LIPASE, AMYLASE in the last 168 hours. Recent Labs  Lab 03/07/24 2300  AMMONIA <13   Coagulation Profile: No results for input(s): INR, PROTIME in the last 168 hours. Cardiac Enzymes: Recent Labs  Lab 03/07/24 1954  CKTOTAL 177   BNP (last 3 results) No results for input(s): PROBNP in the last 8760 hours. HbA1C: Recent Labs    03/07/24 1954  HGBA1C 13.1*   CBG: Recent Labs  Lab 03/08/24 0407 03/08/24 0514 03/08/24 0723 03/08/24 0902 03/08/24 1005  GLUCAP 144* 130* 129* 110* 121*   Lipid Profile: No results for input(s): CHOL, HDL, LDLCALC, TRIG, CHOLHDL, LDLDIRECT in the last 72 hours. Thyroid Function Tests: Recent Labs    03/07/24 1954  TSH 2.306   Anemia Panel: Recent Labs    03/07/24 1954  VITAMINB12 747   Sepsis Labs: No results for input(s): PROCALCITON, LATICACIDVEN in the last 168 hours.  No results  found for this or any previous visit (from the past 240 hours).       Radiology Studies: ECHOCARDIOGRAM COMPLETE Result Date: 03/08/2024    ECHOCARDIOGRAM REPORT   Patient Name:   Carl Yang Cid Date of Exam: 03/08/2024 Medical Rec #:  [id number]          Height:       74.0 in Accession #:    [id number]         Weight:       205.0 lb Date of Birth:  1941-04-25          BSA:          2.197 m Patient Age:    83 years           BP:           117/55 mmHg Patient Gender:  M                  HR:           41 bpm. Exam Location:  [facility] Procedure: 2D Echo, Cardiac Doppler, Color Doppler and Strain Analysis (Both            Spectral and Color Flow Doppler were utilized during procedure). Indications:     Abnormal ECG R94.31  History:         Patient has no prior history of Echocardiogram examinations.  Sonographer:     Thedora Louder RDCS, FASE Referring Phys:  [id number] PRENTICE BROCKS CORE Diagnosing Phys: Cara JONETTA Lovelace Yang  Sonographer Comments: Global longitudinal strain was attempted. IMPRESSIONS  1. Left ventricular ejection fraction, by estimation, is 55 to 60%. The left ventricle has normal function. The left ventricle has no regional wall motion abnormalities. Left ventricular diastolic parameters were normal. The average left ventricular global longitudinal strain is 18.3 %. The global longitudinal strain is normal.  2. Right ventricular systolic function is normal. The right ventricular size is normal.  3. The mitral valve is normal in structure. Trivial mitral valve regurgitation.  4. The aortic valve is normal in structure. Aortic valve regurgitation is not visualized. FINDINGS  Left Ventricle: Left ventricular ejection fraction, by estimation, is 55 to 60%. The left ventricle has normal function. The left ventricle has no regional wall motion abnormalities. The average left ventricular global longitudinal strain is 18.3 %. Strain was performed and the global longitudinal strain is normal. The left  ventricular internal cavity size was normal in size. There is no left ventricular hypertrophy. Left ventricular diastolic parameters were normal. Right Ventricle: The right ventricular size is normal. No increase in right ventricular wall thickness. Right ventricular systolic function is normal. Left Atrium: Left atrial size was normal in size. Right Atrium: Right atrial size was normal in size. Pericardium: There is no evidence of pericardial effusion. Mitral Valve: The mitral valve is normal in structure. Trivial mitral valve regurgitation. Tricuspid Valve: The tricuspid valve is normal in structure. Tricuspid valve regurgitation is mild. Aortic Valve: The aortic valve is normal in structure. Aortic valve regurgitation is not visualized. Aortic valve peak gradient measures 7.1 mmHg. Pulmonic Valve: The pulmonic valve was normal in structure. Pulmonic valve regurgitation is not visualized. Aorta: The ascending aorta was not well visualized. IAS/Shunts: No atrial level shunt detected by color flow Doppler. Additional Comments: 3D was performed not requiring image post processing on an independent workstation and was normal.  LEFT VENTRICLE PLAX 2D LVIDd:         4.80 cm   Diastology LVIDs:         3.40 cm   LV e' medial:    7.40 cm/s LV PW:         1.00 cm   LV E/e' medial:  9.2 LV IVS:        1.00 cm   LV e' lateral:   10.40 cm/s LVOT diam:     2.20 cm   LV E/e' lateral: 6.5 LV SV:         91 LV SV Index:   41        2D Longitudinal Strain LVOT Area:     3.80 cm  2D Strain GLS (A4C):   15.3 %                          2D Strain GLS (A3C):  20.5 %                          2D Strain GLS (A2C):   19.0 %                          2D Strain GLS Avg:     18.3 % RIGHT VENTRICLE RV Basal diam:  3.60 cm RV S prime:     15.60 cm/s TAPSE (M-mode): 2.4 cm LEFT ATRIUM           Index        RIGHT ATRIUM           Index LA diam:      4.10 cm 1.87 cm/m   RA Area:     26.50 cm LA Vol (A2C): 44.5 ml 20.26 ml/m  RA Volume:    99.10 ml  45.12 ml/m LA Vol (A4C): 35.2 ml 16.03 ml/m  AORTIC VALVE                 PULMONIC VALVE AV Area (Vmax): 3.00 cm     PV Vmax:        1.14 m/s AV Vmax:        133.00 cm/s  PV Peak grad:   5.2 mmHg AV Peak Grad:   7.1 mmHg     RVOT Peak grad: 3 mmHg LVOT Vmax:      105.00 cm/s LVOT Vmean:     75.000 cm/s LVOT VTI:       0.239 m  AORTA Ao Root diam: 3.50 cm Ao Asc diam:  3.10 cm MITRAL VALVE               TRICUSPID VALVE MV Area (PHT): 2.74 cm    TR Peak grad:   24.0 mmHg MV Decel Time: 277 msec    TR Vmax:        245.00 cm/s MV E velocity: 68.10 cm/s MV A velocity: 52.90 cm/s  SHUNTS MV E/A ratio:  1.29        Systemic VTI:  0.24 m                            Systemic Diam: 2.20 cm Cara JONETTA Lovelace Yang Electronically signed by Cara JONETTA Lovelace Yang Signature Date/Time: 03/08/2024/9:44:31 AM    Final    US  RENAL Result Date: 03/07/2024 CLINICAL DATA:  Acute renal injury EXAM: RENAL / URINARY TRACT ULTRASOUND COMPLETE COMPARISON:  None Available. FINDINGS: Right Kidney: Renal measurements: 9.9 x 4.9 x 5.4 cm. = volume: 137 mL. Increased echogenicity is noted. No mass or hydronephrosis is seen. Left Kidney: Renal measurements: 9.5 x 4.8 x 4.8 cm. = volume: 114 mL. Increased echogenicity is noted. No mass or hydronephrosis is noted. Bladder: Appears normal for degree of bladder distention. Other: None. IMPRESSION: Changes consistent with medical renal disease. No other focal abnormality is noted. Electronically Signed   By: Oneil Devonshire M.D.   On: 03/07/2024 22:13   US  Venous Img Lower Unilateral Right (DVT) Result Date: 03/07/2024 CLINICAL DATA:  Right lower extremity swelling EXAM: RIGHT LOWER EXTREMITY VENOUS DOPPLER ULTRASOUND TECHNIQUE: Gray-scale sonography with graded compression, as well as color Doppler and duplex ultrasound were performed to evaluate the lower extremity deep venous systems from the level of the common femoral vein and including the common femoral, femoral, profunda femoral,  popliteal and calf veins including  the posterior tibial, peroneal and gastrocnemius veins when visible. The superficial great saphenous vein was also interrogated. Spectral Doppler was utilized to evaluate flow at rest and with distal augmentation maneuvers in the common femoral, femoral and popliteal veins. COMPARISON:  None Available. FINDINGS: Contralateral Common Femoral Vein: Respiratory phasicity is normal and symmetric with the symptomatic side. No evidence of thrombus. Normal compressibility. Common Femoral Vein: No evidence of thrombus. Normal compressibility, respiratory phasicity and response to augmentation. Saphenofemoral Junction: No evidence of thrombus. Normal compressibility and flow on color Doppler imaging. Profunda Femoral Vein: No evidence of thrombus. Normal compressibility and flow on color Doppler imaging. Femoral Vein: Thrombus is noted with decreased compressibility. Popliteal Vein: Thrombus is noted with decreased compressibility. Calf Veins: Not well visualized Superficial Great Saphenous Vein: No evidence of thrombus. Normal compressibility. Venous Reflux:  None. Other Findings:  None. 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 Electronically Signed   By: Oneil Devonshire M.D.   On: 03/07/2024 22:12   Portable chest x-ray (1 view) Result Date: 03/07/2024 CLINICAL DATA:  Bradycardia EXAM: PORTABLE CHEST 1 VIEW COMPARISON:  05/05/2019 FINDINGS: The lungs are symmetrically hyperinflated in keeping with changes of underlying COPD. 15 mm nodular opacity seen at the left costophrenic angle, indeterminate but new since prior examination. Lungs are otherwise clear. No pneumothorax or pleural effusion. Cardiac size is mildly enlarged, stable. Pulmonary vascularity is normal. Osseous structures are age appropriate. IMPRESSION: 1. No radiographic evidence of acute cardiopulmonary disease. 2. COPD. 3. 15 mm nodular opacity at the left  costophrenic angle, indeterminate. This could be better assessed with nonemergent CT imaging if indicated. Electronically Signed   By: Dorethia Molt M.D.   On: 03/07/2024 20:19   CT HEAD WO CONTRAST ( ) Result Date: 03/07/2024 CLINICAL DATA:  Delirium EXAM: CT HEAD WITHOUT CONTRAST TECHNIQUE: Contiguous axial images were obtained from the base of the skull through the vertex without intravenous contrast. RADIATION DOSE REDUCTION: This exam was performed according to the departmental dose-optimization program which includes automated exposure control, adjustment of the mA and/or kV according to patient size and/or use of iterative reconstruction technique. COMPARISON:  07/20/2008 FINDINGS: Brain: There is no mass, hemorrhage or extra-axial collection. The appearance of the white matter is normal for the patient's age. There is generalized atrophy. Vascular: Atherosclerotic calcification of the internal carotid arteries at the skull base. No abnormal hyperdensity of the major intracranial arteries or dural venous sinuses. Skull: The visualized skull base, calvarium and extracranial soft tissues are normal. Sinuses/Orbits: Bilateral maxillary sinus mucosal thickening. Small amount of right mastoid fluid. Normal orbits. Other: None. IMPRESSION: 1. No acute intracranial abnormality. 2. Bilateral maxillary sinus mucosal thickening. 3. Small amount of right mastoid fluid. Electronically Signed   By: Franky Stanford M.D.   On: 03/07/2024 20:16        Scheduled Meds:  apixaban   2.5 mg Oral BID   Chlorhexidine  Gluconate Cloth  6 each Topical QHS   insulin  aspart  0-9 Units Subcutaneous Q4H   insulin  glargine-yfgn  30 Units Subcutaneous Daily   loratadine   10 mg Oral Daily   pantoprazole   40 mg Oral Daily   rosuvastatin   40 mg Oral Daily   Continuous Infusions:   ceFAZolin  (ANCEF ) IV Stopped (03/08/24 0547)   dextrose  5% lactated ringers  125 mL/hr at 03/08/24 0813   insulin  2.4 Units/hr (03/08/24 0813)    lactated ringers  Stopped (03/08/24 0106)     LOS: 1 day  Time spent: 45 minutes spent on chart review, discussion with nursing staff, consultants, updating family and interview/physical exam; more than 50% of that time was spent in counseling and/or coordination of care.    Harlene RAYMOND Bowl, DO [facility] Available via Epic secure chat 7am-7pm After these hours, please refer to coverage provider listed on [email or web address] 03/08/2024, 11:19 AM

## 2024-03-08 NOTE — Progress Notes (Signed)
*  PRELIMINARY RESULTS* Echocardiogram 2D Echocardiogram has been performed.  Carl Yang Louder 03/08/2024, 9:38 AM

## 2024-03-08 NOTE — Plan of Care (Signed)
  Problem: Education: Goal: Ability to describe self-care measures that may prevent or decrease complications (Diabetes Survival Skills Education) will improve Outcome: Progressing   Problem: Coping: Goal: Ability to adjust to condition or change in health will improve Outcome: Progressing   Problem: Metabolic: Goal: Ability to maintain appropriate glucose levels will improve Outcome: Progressing   Problem: Skin Integrity: Goal: Risk for impaired skin integrity will decrease Outcome: Progressing   Problem: Cardiac: Goal: Ability to maintain an adequate cardiac output will improve Outcome: Progressing   Problem: Fluid Volume: Goal: Ability to achieve a balanced intake and output will improve Outcome: Not Progressing

## 2024-03-08 NOTE — Consult Note (Signed)
 CARDIOLOGY CONSULT NOTE               Patient ID: Carl Yang MRN: [id number] DOB/AGE: May 13, 1941 83 y.o.  Admit date: 03/07/2024 Referring Physician Dr. Prentice Core hospitalist Primary Physician Dr. 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  Cardiology was consulted because of bradycardia high-grade AV block and now A-fib  Review of systems complete and found to be negative unless listed above     Past Medical History:  Diagnosis Date   Arthritis    Atrial fibrillation (HCC)    Cancer (HCC)    Melanoma   Chronic anticoagulation    Warfarin   Complication of anesthesia    After surgery at [facility], the patient coded during the night.   Coronary artery disease    Stents   Diabetes mellitus without complication (HCC)    DVT (deep venous thrombosis) (HCC)    Right lower limb   Dyspnea    GERD (gastroesophageal reflux disease)    Insulin  pump in place    Myocardial infarction (HCC)    1998, 2003   Neuromuscular disorder (HCC)    Peripheral Neuropathy r/t DM   Peripheral vascular disease (HCC)    Diabetes   Sleep apnea    Uses CPAP    Past Surgical History:  Procedure Laterality Date   AMPUTATION TOE Left    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   AMPUTATION TOE Left 10/09/2018   Procedure: TOE IPJ 2ND TOE LEFT;  Surgeon: Lilli Cough, DPM;  Location: [facility];   Service: Podiatry;  Laterality: Left;  IVA LOCAL Diabeticv - insulin  pump   BACK SURGERY     L4 & L5 Fusion   BONE EXCISION Left 07/08/2020   Procedure: EXCISION BONE METATARSAL LEFT;  Surgeon: Ashley Soulier, DPM;  Location: [facility];  Service: Podiatry;  Laterality: Left;   CARDIAC CATHETERIZATION  2003   CORONARY ANGIOPLASTY WITH STENT PLACEMENT     stents x 2   DIAGNOSTIC LAPAROSCOPY     EYE SURGERY Bilateral    cataracts   INCISION AND DRAINAGE OF WOUND Left 06/07/2023   Procedure: IRRIGATION AND DEBRIDEMENT BONE OF LEFT HEEL;  Surgeon: Neill Boas, DPM;  Location: [facility] OR[facility];  Service: Orthopedics/Podiatry;  Laterality: Left;   LOWER EXTREMITY ANGIOGRAPHY Left 06/06/2023   Procedure: Lower Extremity Angiography;  Surgeon: Jama Cordella MATSU, MD;  Location: [facility] INVAS[facility];  Service: Cardiovascular;  Laterality: Left;   PARTIAL KNEE ARTHROPLASTY Left 02/09/2020   Procedure: UNICOMPARTMENTAL KNEE;  Surgeon: Edie Norleen PARAS, MD;  Location: [facility];  Service: Orthopedics;  Laterality: Left;   TONSILLECTOMY     TOTAL KNEE ARTHROPLASTY Right 07/15/2018   Procedure: TOTAL KNEE ARTHROPLASTY;  Surgeon: Edie Norleen PARAS, MD;  Location: [facility] OR[facility];  Service: Orthopedics;  Laterality: Right;   TOTAL KNEE REVISION Right 05/05/2019   Procedure: TOTAL KNEE REVISION;  Surgeon: Edie Norleen PARAS, MD;  Location: [facility] OR[facility];  Service: Orthopedics;  Laterality: Right;  Medications Prior to Admission  Medication Sig Dispense Refill Last Dose/Taking   ELIQUIS  2.5 MG TABS tablet Take 2.5 mg by mouth 2 (two) times daily.      esomeprazole  (NEXIUM ) 40 MG capsule Take 1 capsule (40 mg total) by mouth daily. (Patient taking differently: Take 40 mg by mouth every morning.) 30 capsule 0    fexofenadine (ALLEGRA) 180 MG tablet Take 180 mg by mouth every morning.   1    rosuvastatin  (CRESTOR ) 40 MG tablet Take 40 mg by mouth daily.      Social History   Socioeconomic History   Marital status: Widowed    Spouse name:  Not on file   Number of children: Not on file   Years of education: Not on file   Highest education level: Not on file  Occupational History   Not on file  Tobacco Use   Smoking status: Never   Smokeless tobacco: Never  Vaping Use   Vaping status: Never Used  Substance and Sexual Activity   Alcohol use: Yes    Alcohol/week: 1.0 standard drink of alcohol    Types: 1 Glasses of wine per week    Comment: Socially   Drug use: Never   Sexual activity: Not Currently  Other Topics Concern   Not on file  Social History Narrative   Not on file   Social Drivers of Health   Financial Resource Strain: Low Risk  (05/07/2023)   Received from [facility]   Overall Financial Resource Strain (CARDIA)    Difficulty of Paying Living Expenses: Not hard at all  Food Insecurity: No Food Insecurity (03/07/2024)   Hunger Vital Sign    Worried About Running Out of Food in the Last Year: Never true    Ran Out of Food in the Last Year: Never true  Transportation Needs: No Transportation Needs (03/07/2024)   PRAPARE - Administrator, Civil Service (Medical): No    Lack of Transportation (Non-Medical): No  Physical Activity: Not on file  Stress: Not on file  Social Connections: Moderately Isolated (03/07/2024)   Social Connection and Isolation Panel    Frequency of Communication with Friends and Family: More than three times a week    Frequency of Social Gatherings with Friends and Family: Patient unable to answer    Attends Religious Services: 1 to 4 times per year    Active Member of [employer] or Organizations: No    Attends Banker Meetings: Patient unable to answer    Marital Status: Widowed  Intimate Partner Violence: Not At Risk (03/07/2024)   Humiliation, Afraid, Rape, and Kick questionnaire    Fear of Current or Ex-Partner: No    Emotionally Abused: No    Physically Abused: No    Sexually Abused: No    History reviewed. No pertinent family history.     Review of systems complete and found to be negative unless listed above      PHYSICAL EXAM  General: Well developed, well nourished, in no acute distress HEENT:  Normocephalic and atramatic Neck:  No JVD.  Lungs: Clear bilaterally to auscultation and percussion. Heart: Bradycardic. Normal S1 and S2 without gallops or murmurs.  Abdomen: Bowel sounds are positive, abdomen soft and non-tender  Msk:  Back normal, normal gait. Normal strength and tone for age. Extremities: No clubbing, cyanosis or edema.  Extremity ulcers on right foot possible cellulitis Neuro: Alert and oriented X 3. Psych:  Good affect, responds appropriately  Labs:   Lab Results  Component Value Date   WBC 7.3 03/08/2024   HGB 10.4 (L) 03/08/2024   HCT 31.6 (L) 03/08/2024   MCV 92.4 03/08/2024   PLT 145 (L) 03/08/2024    Recent Labs  Lab 03/08/24 0446  NA 135  K 3.9  CL 104  CO2 25  BUN 30*  CREATININE 1.32*  CALCIUM  8.6*  PROT 5.1*  BILITOT 0.7  ALKPHOS 50  ALT 20  AST 24  GLUCOSE 137*   Lab Results  Component Value Date   CKTOTAL 177 03/07/2024    Lab Results  Component Value Date   CHOL 116 02/23/2020   Lab Results  Component Value Date   HDL 35 (L) 02/23/2020   Lab Results  Component Value Date   LDLCALC 72 02/23/2020   Lab Results  Component Value Date   TRIG 47 02/23/2020   Lab Results  Component Value Date   CHOLHDL 3.3 02/23/2020   No results found for: LDLDIRECT    Radiology: ECHOCARDIOGRAM COMPLETE Result Date: 03/08/2024    ECHOCARDIOGRAM REPORT   Patient Name:   Carl Yang Date of Exam: 03/08/2024 Medical Rec #:  [id number]          Height:       74.0 in Accession #:    [id number]         Weight:       205.0 lb Date of Birth:  1940/11/13          BSA:          2.197 m Patient Age:    83 years           BP:           117/55 mmHg Patient Gender: M                  HR:           41 bpm. Exam Location:  [facility] Procedure: 2D Echo, Cardiac Doppler, Color Doppler  and Strain Analysis (Both            Spectral and Color Flow Doppler were utilized during procedure). Indications:     Abnormal ECG R94.31  History:         Patient has no prior history of Echocardiogram examinations.  Sonographer:     Thedora Louder RDCS, FASE Referring Phys:  [id number] PRENTICE BROCKS CORE Diagnosing Phys: Cara JONETTA Lovelace MD  Sonographer Comments: Global longitudinal strain was attempted. IMPRESSIONS  1. Left ventricular ejection fraction, by estimation, is 55 to 60%. The left ventricle has normal function. The left ventricle has no regional wall motion abnormalities. Left ventricular diastolic parameters were normal. The average left ventricular global longitudinal strain is 18.3 %. The global longitudinal strain is normal.  2. Right ventricular systolic function is normal. The right ventricular size is normal.  3. The mitral valve is normal in structure. Trivial mitral valve regurgitation.  4. The aortic valve is normal in structure. Aortic valve regurgitation is not visualized. FINDINGS  Left Ventricle: Left ventricular ejection fraction, by estimation, is 55 to 60%. The left ventricle has normal function. The left ventricle has no regional wall motion abnormalities. The average left ventricular global longitudinal strain is 18.3 %. Strain was performed and the global longitudinal strain is normal. The left ventricular internal cavity size was normal in size. There is no left ventricular hypertrophy. Left ventricular diastolic parameters were normal. Right Ventricle: The right ventricular size is normal. No increase  in right ventricular wall thickness. Right ventricular systolic function is normal. Left Atrium: Left atrial size was normal in size. Right Atrium: Right atrial size was normal in size. Pericardium: There is no evidence of pericardial effusion. Mitral Valve: The mitral valve is normal in structure. Trivial mitral valve regurgitation. Tricuspid Valve: The tricuspid valve is normal in  structure. Tricuspid valve regurgitation is mild. Aortic Valve: The aortic valve is normal in structure. Aortic valve regurgitation is not visualized. Aortic valve peak gradient measures 7.1 mmHg. Pulmonic Valve: The pulmonic valve was normal in structure. Pulmonic valve regurgitation is not visualized. Aorta: The ascending aorta was not well visualized. IAS/Shunts: No atrial level shunt detected by color flow Doppler. Additional Comments: 3D was performed not requiring image post processing on an independent workstation and was normal.  LEFT VENTRICLE PLAX 2D LVIDd:         4.80 cm   Diastology LVIDs:         3.40 cm   LV e' medial:    7.40 cm/s LV PW:         1.00 cm   LV E/e' medial:  9.2 LV IVS:        1.00 cm   LV e' lateral:   10.40 cm/s LVOT diam:     2.20 cm   LV E/e' lateral: 6.5 LV SV:         91 LV SV Index:   41        2D Longitudinal Strain LVOT Area:     3.80 cm  2D Strain GLS (A4C):   15.3 %                          2D Strain GLS (A3C):   20.5 %                          2D Strain GLS (A2C):   19.0 %                          2D Strain GLS Avg:     18.3 % RIGHT VENTRICLE RV Basal diam:  3.60 cm RV S prime:     15.60 cm/s TAPSE (M-mode): 2.4 cm LEFT ATRIUM           Index        RIGHT ATRIUM           Index LA diam:      4.10 cm 1.87 cm/m   RA Area:     26.50 cm LA Vol (A2C): 44.5 ml 20.26 ml/m  RA Volume:   99.10 ml  45.12 ml/m LA Vol (A4C): 35.2 ml 16.03 ml/m  AORTIC VALVE                 PULMONIC VALVE AV Area (Vmax): 3.00 cm     PV Vmax:        1.14 m/s AV Vmax:        133.00 cm/s  PV Peak grad:   5.2 mmHg AV Peak Grad:   7.1 mmHg     RVOT Peak grad: 3 mmHg LVOT Vmax:      105.00 cm/s LVOT Vmean:     75.000 cm/s LVOT VTI:       0.239 m  AORTA Ao Root diam: 3.50 cm Ao Asc diam:  3.10 cm MITRAL VALVE  TRICUSPID VALVE MV Area (PHT): 2.74 cm    TR Peak grad:   24.0 mmHg MV Decel Time: 277 msec    TR Vmax:        245.00 cm/s MV E velocity: 68.10 cm/s MV A velocity: 52.90 cm/s  SHUNTS  MV E/A ratio:  1.29        Systemic VTI:  0.24 m                            Systemic Diam: 2.20 cm Cara JONETTA Lovelace MD Electronically signed by Cara JONETTA Lovelace MD Signature Date/Time: 03/08/2024/9:44:31 AM    Final    US  RENAL Result Date: 03/07/2024 CLINICAL DATA:  Acute renal injury EXAM: RENAL / URINARY TRACT ULTRASOUND COMPLETE COMPARISON:  None Available. FINDINGS: Right Kidney: Renal measurements: 9.9 x 4.9 x 5.4 cm. = volume: 137 mL. Increased echogenicity is noted. No mass or hydronephrosis is seen. Left Kidney: Renal measurements: 9.5 x 4.8 x 4.8 cm. = volume: 114 mL. Increased echogenicity is noted. No mass or hydronephrosis is noted. Bladder: Appears normal for degree of bladder distention. Other: None. IMPRESSION: Changes consistent with medical renal disease. No other focal abnormality is noted. Electronically Signed   By: Oneil Devonshire M.D.   On: 03/07/2024 22:13   US  Venous Img Lower Unilateral Right (DVT) Result Date: 03/07/2024 CLINICAL DATA:  Right lower extremity swelling EXAM: RIGHT LOWER EXTREMITY VENOUS DOPPLER ULTRASOUND TECHNIQUE: Gray-scale sonography with graded compression, as well as color Doppler and duplex ultrasound were performed to evaluate the lower extremity deep venous systems from the level of the common femoral vein and including the common femoral, femoral, profunda femoral, popliteal and calf veins including the posterior tibial, peroneal and gastrocnemius veins when visible. The superficial great saphenous vein was also interrogated. Spectral Doppler was utilized to evaluate flow at rest and with distal augmentation maneuvers in the common femoral, femoral and popliteal veins. COMPARISON:  None Available. FINDINGS: Contralateral Common Femoral Vein: Respiratory phasicity is normal and symmetric with the symptomatic side. No evidence of thrombus. Normal compressibility. Common Femoral Vein: No evidence of thrombus. Normal compressibility, respiratory phasicity and  response to augmentation. Saphenofemoral Junction: No evidence of thrombus. Normal compressibility and flow on color Doppler imaging. Profunda Femoral Vein: No evidence of thrombus. Normal compressibility and flow on color Doppler imaging. Femoral Vein: Thrombus is noted with decreased compressibility. Popliteal Vein: Thrombus is noted with decreased compressibility. Calf Veins: Not well visualized Superficial Great Saphenous Vein: No evidence of thrombus. Normal compressibility. Venous Reflux:  None. Other Findings:  None. 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 Electronically Signed   By: Oneil Devonshire M.D.   On: 03/07/2024 22:12   Portable chest x-ray (1 view) Result Date: 03/07/2024 CLINICAL DATA:  Bradycardia EXAM: PORTABLE CHEST 1 VIEW COMPARISON:  05/05/2019 FINDINGS: The lungs are symmetrically hyperinflated in keeping with changes of underlying COPD. 15 mm nodular opacity seen at the left costophrenic angle, indeterminate but new since prior examination. Lungs are otherwise clear. No pneumothorax or pleural effusion. Cardiac size is mildly enlarged, stable. Pulmonary vascularity is normal. Osseous structures are age appropriate. IMPRESSION: 1. No radiographic evidence of acute cardiopulmonary disease. 2. COPD. 3. 15 mm nodular opacity at the left costophrenic angle, indeterminate. This could be better assessed with nonemergent CT imaging if indicated. Electronically Signed   By: Dorethia Molt M.D.   On: 03/07/2024 20:19   CT  HEAD WO CONTRAST ( ) Result Date: 03/07/2024 CLINICAL DATA:  Delirium EXAM: CT HEAD WITHOUT CONTRAST TECHNIQUE: Contiguous axial images were obtained from the base of the skull through the vertex without intravenous contrast. RADIATION DOSE REDUCTION: This exam was performed according to the departmental dose-optimization program which includes automated exposure control, adjustment of the mA and/or kV according to  patient size and/or use of iterative reconstruction technique. COMPARISON:  07/20/2008 FINDINGS: Brain: There is no mass, hemorrhage or extra-axial collection. The appearance of the white matter is normal for the patient's age. There is generalized atrophy. Vascular: Atherosclerotic calcification of the internal carotid arteries at the skull base. No abnormal hyperdensity of the major intracranial arteries or dural venous sinuses. Skull: The visualized skull base, calvarium and extracranial soft tissues are normal. Sinuses/Orbits: Bilateral maxillary sinus mucosal thickening. Small amount of right mastoid fluid. Normal orbits. Other: None. IMPRESSION: 1. No acute intracranial abnormality. 2. Bilateral maxillary sinus mucosal thickening. 3. Small amount of right mastoid fluid. Electronically Signed   By: Franky Stanford M.D.   On: 03/07/2024 20:16    EKG: Profound bradycardia appears to be third-degree AV block A-V dissociation rate of 39 nonspecific ST-T wave changes EKG  03/07/24 21:18 Atrial fibrillation rate of 90 nonspecific ST-T wave changes  ASSESSMENT AND PLAN:  Bradycardia transient complete heart block Atrial fibrillation Hyperkalemia Hyperosmolar hyperglycemic state Acute kidney injury Failure to thrive Mild dementia Cellulitis lower extremity Acute metabolic encephalopathy Borderline troponins GERD History of hypertension Obstructive sleep apnea Hyperlipidemia . Plan Agree with ICU level care for diabetes management and treatment of hyperosmolar hyperglycemia state Continue hydration and insulin  therapy IV Correct hyperkalemia as this may be contributing to bradycardia and high-grade block would like potassium less than 4 Renal insufficiency recommend rehydrate as this may improve renal function send because patient appears to be dehydrated Borderline troponins probably demand ischemia do not expect ACS Atrial fibrillation continue anticoagulation with Eliquis  therapy CPAP for  obstructive sleep apnea continue current management Continue Crestor  for hyperlipidemia Do not recommend temporary or permanent pacemaker at this stage because of comorbid disease would recommend conservative management as per family's request Continue supportive care  Signed: Cara JONETTA Lovelace MD 03/08/2024, 9:58 AM

## 2024-03-08 NOTE — Plan of Care (Signed)

## 2024-03-09 DIAGNOSIS — D6869 Other thrombophilia: Secondary | ICD-10-CM

## 2024-03-09 DIAGNOSIS — I10 Essential (primary) hypertension: Secondary | ICD-10-CM

## 2024-03-09 DIAGNOSIS — N179 Acute kidney failure, unspecified: Secondary | ICD-10-CM

## 2024-03-09 DIAGNOSIS — L03115 Cellulitis of right lower limb: Secondary | ICD-10-CM

## 2024-03-09 DIAGNOSIS — K219 Gastro-esophageal reflux disease without esophagitis: Secondary | ICD-10-CM

## 2024-03-09 DIAGNOSIS — E871 Hypo-osmolality and hyponatremia: Secondary | ICD-10-CM

## 2024-03-09 DIAGNOSIS — G4733 Obstructive sleep apnea (adult) (pediatric): Secondary | ICD-10-CM

## 2024-03-09 DIAGNOSIS — E875 Hyperkalemia: Secondary | ICD-10-CM | POA: Diagnosis not present

## 2024-03-09 DIAGNOSIS — R001 Bradycardia, unspecified: Secondary | ICD-10-CM | POA: Diagnosis not present

## 2024-03-09 DIAGNOSIS — G9341 Metabolic encephalopathy: Secondary | ICD-10-CM

## 2024-03-09 DIAGNOSIS — R627 Adult failure to thrive: Secondary | ICD-10-CM

## 2024-03-09 LAB — BASIC METABOLIC PANEL WITH GFR
Anion gap: 6 (ref 5–15)
BUN: 24 mg/dL — ABNORMAL HIGH (ref 8–23)
CO2: 25 mmol/L (ref 22–32)
Calcium: 8.6 mg/dL — ABNORMAL LOW (ref 8.9–10.3)
Chloride: 105 mmol/L (ref 98–111)
Creatinine, Ser: 1.26 mg/dL — ABNORMAL HIGH (ref 0.61–1.24)
GFR, Estimated: 57 mL/min — ABNORMAL LOW (ref >60)
Glucose, Bld: 212 mg/dL — ABNORMAL HIGH (ref 70–99)
Potassium: 4.5 mmol/L (ref 3.5–5.1)
Sodium: 136 mmol/L (ref 135–145)

## 2024-03-09 LAB — CBC
HCT: 34 % — ABNORMAL LOW (ref 39.0–52.0)
Hemoglobin: 11.3 g/dL — ABNORMAL LOW (ref 13.0–17.0)
MCH: 31 pg (ref 26.0–34.0)
MCHC: 33.2 g/dL (ref 30.0–36.0)
MCV: 93.2 fL (ref 80.0–100.0)
Platelets: 143 10*3/uL — ABNORMAL LOW (ref 150–400)
RBC: 3.65 MIL/uL — ABNORMAL LOW (ref 4.22–5.81)
RDW: 14.9 % (ref 11.5–15.5)
WBC: 6.3 10*3/uL (ref 4.0–10.5)
nRBC: 0 % (ref 0.0–0.2)

## 2024-03-09 LAB — GLUCOSE, CAPILLARY
Glucose-Capillary: 198 mg/dL — ABNORMAL HIGH (ref 70–99)
Glucose-Capillary: 261 mg/dL — ABNORMAL HIGH (ref 70–99)
Glucose-Capillary: 274 mg/dL — ABNORMAL HIGH (ref 70–99)
Glucose-Capillary: 354 mg/dL — ABNORMAL HIGH (ref 70–99)
Glucose-Capillary: 187 mg/dL — ABNORMAL HIGH (ref 70–99)
Glucose-Capillary: 152 mg/dL — ABNORMAL HIGH (ref 70–99)

## 2024-03-09 MED ORDER — RAMIPRIL 1.25 MG PO CAPS
2.5000 mg | ORAL_CAPSULE | Freq: Every day | ORAL | Status: DC
  Administered 2024-03-09 – 2024-03-10 (×2): 2.5 mg via ORAL
  Filled 2024-03-09 (×2): qty 2

## 2024-03-09 MED ORDER — RAMIPRIL 2.5 MG PO CAPS
2.5000 mg | ORAL_CAPSULE | Freq: Every day | ORAL | Status: DC
  Filled 2024-03-09: qty 1

## 2024-03-09 NOTE — NC FL2 (Signed)
 [place]  MEDICAID FL2 LEVEL OF CARE FORM     IDENTIFICATION  Patient Name: Carl Yang Birthdate: 1940-11-07 Sex: male Admission Date (Current Location): 03/07/2024  [place] and [place] Number:  Chiropodist and Address:  [facility], [address] [place], [place] [address]      Provider Number: [id number]  Attending Physician Name and Address:  Carl Qualia, MD  Relative Name and Phone Number:  Carl Yang,  [phone]    Current Level of Care: Hospital Recommended Level of Care: Skilled Nursing Facility Prior Approval Number:    Date Approved/Denied:   PASRR Number: [id number]  Discharge Plan: SNF    Current Diagnoses: Patient Active Problem List   Diagnosis Date Noted   Bradycardia 03/07/2024   Hyperosmolar hyperglycemic state (HHS) (HCC) 03/07/2024   AKI (acute kidney injury) (HCC) 03/07/2024   Elevated troponin 03/07/2024   Hyperkalemia 03/07/2024   Hyponatremia 03/07/2024   Secondary hypercoagulability disorder (HCC) 03/07/2024   Acute metabolic encephalopathy 03/07/2024   Cellulitis 03/07/2024   Failure to thrive in adult 03/07/2024   Dementia (HCC) 03/07/2024   Diabetic foot ulcer (HCC) 06/12/2023   Ulcer of left heel (HCC) 06/04/2023   Atrial fibrillation (HCC)    History of coronary artery stent placement 06/28/2020   PAD (peripheral artery disease) (HCC) 06/28/2020   Status post left partial knee replacement 02/09/2020   Anemia of chronic disease 09/01/2019   CKD (chronic kidney disease) stage 3, GFR 30-59 ml/min (HCC) 09/01/2019   Status post revision of total knee replacement, right 05/05/2019   Diabetic retinopathy associated with type 1 diabetes mellitus (HCC) 04/20/2019   History of coronary artery disease 10/06/2018   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) 07/28/2018   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, with long-term current use of insulin  (HCC)  07/28/2018   Essential hypertension 07/28/2018   OSA (obstructive sleep apnea) 07/28/2018   GERD (gastroesophageal reflux disease) 07/28/2018   Chronic non-seasonal allergic rhinitis 07/28/2018   Chronic constipation 07/28/2018   Primary osteoarthritis of right knee 07/28/2018   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) 07/28/2018   Status post total knee replacement using cement, right 07/15/2018   Status post revision of total replacement of right knee 07/15/2018   Primary osteoarthritis of left knee 05/23/2017   Medicare annual wellness visit, subsequent 03/05/2017   History of chronic health problem 03/13/2016   Vaccine counseling 03/13/2016   History of amputation of lesser toe of left foot (HCC) 10/20/2015   History of malignant melanoma of skin 12/24/2014   H/O adenomatous polyp of colon 08/18/2014   History of DVT (deep vein thrombosis) 05/05/2014   Type 1 diabetes mellitus (HCC) 03/23/2014   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) 03/23/2014    Orientation RESPIRATION BLADDER Height & Weight     Self, Time, Situation, Place  Normal Incontinent Weight: 205 lb 0.4 oz (93 kg) Height:  6' 2 (188 cm)  BEHAVIORAL SYMPTOMS/MOOD NEUROLOGICAL BOWEL NUTRITION STATUS      Incontinent Diet (See DC summary)  AMBULATORY STATUS COMMUNICATION OF NEEDS Skin   Extensive Assist Verbally Normal                       Personal Care Assistance Level of Assistance  Bathing, Feeding, Dressing Bathing Assistance: Limited assistance Feeding assistance: Limited assistance Dressing Assistance: Limited assistance     Functional Limitations Info  Sight, Hearing, Speech Sight Info:  Adequate Hearing Info: Adequate Speech Info: Adequate    SPECIAL CARE FACTORS FREQUENCY  PT (By licensed PT), OT (By licensed OT)     PT Frequency: 5x/week OT Frequency: 5x/week            Contractures Contractures Info: Not present    Additional Factors Info   Code Status, Allergies Code Status Info: DNR Allergies Info: NKA           Current Medications (03/09/2024):  This is the current hospital active medication list Current Facility-Administered Medications  Medication Dose Route Frequency Provider Last Rate Last Admin   alum & mag hydroxide-simeth (MAALOX/MYLANTA) 200-200-20 MG/5ML suspension 30 mL  30 mL Oral Q4H PRN Carl Yang, Carl BROCKS, MD   30 mL at 03/08/24 0425   apixaban  (ELIQUIS ) tablet 2.5 mg  2.5 mg Oral BID Carl Yang, Carl BROCKS, MD   2.5 mg at 03/09/24 0928   ceFAZolin  (ANCEF ) IVPB 2g/100 mL premix  2 g Intravenous Q8H Carl Yang, Carl BROCKS, MD   Stopped at 03/09/24 0554   Chlorhexidine  Gluconate Cloth 2 % PADS 6 each  6 each Topical QHS Carl Yang, Carl BROCKS, MD   6 each at 03/08/24 2208   dextrose  50 % solution 0-50 mL  0-50 mL Intravenous PRN Carl Yang, Carl BROCKS, MD       insulin  aspart (novoLOG ) injection 0-15 Units  0-15 Units Subcutaneous TID WC Yang, Carl U, DO   8 Units at 03/09/24 1144   insulin  aspart (novoLOG ) injection 0-5 Units  0-5 Units Subcutaneous QHS Yang, Carl U, DO   2 Units at 03/08/24 2152   insulin  glargine-yfgn (SEMGLEE ) injection 30 Units  30 Units Subcutaneous Daily Yang, Carl U, DO   30 Units at 03/09/24 9070   loratadine  (CLARITIN ) tablet 10 mg  10 mg Oral Daily Carl Yang, Carl BROCKS, MD   10 mg at 03/08/24 2152   Oral care mouth rinse  15 mL Mouth Rinse PRN Carl Yang, Carl BROCKS, MD       pantoprazole  (PROTONIX ) EC tablet 40 mg  40 mg Oral Daily Carl Yang, Carl BROCKS, MD   40 mg at 03/09/24 9071   rosuvastatin  (CRESTOR ) tablet 40 mg  40 mg Oral Daily Carl Yang, Carl BROCKS, MD   40 mg at 03/08/24 2151     Discharge Medications: Please see discharge summary for a list of discharge medications.  Relevant Imaging Results:  Relevant Lab Results:   Additional Information SSN: [id number]  Carl Yang  [place], LCSW

## 2024-03-09 NOTE — Progress Notes (Signed)
 PROGRESS NOTE    Carl Yang  FMW:[id number] DOB: Oct 03, 1940 DOA: 03/07/2024 PCP: Alla Amis, MD    Brief Narrative:  83 year old gentleman with a past medical history of mixed dementia, type 1 diabetes mellitus, deep venous thrombosis, atrial fibrillation coronary artery disease status post PCI, hypertension, chronic kidney disease stage III who presented from his independent living facility due to increasing confusion, the patient's daughter [name] at the bedside assists with history. The patient insulin  pump was and recently has been on insulin  pen however the compliance is likely dubious. The patient denied any fever or chills denies any open wounds including his legs and reports his right lower extremity edema is slightly larger than baseline and was unsure regarding the new erythema.   6/23: Intermittent bradycardia and A-fib.  Hemodynamically stable.  PT is recommending SNF.  Improving renal function with creatinine at 1.26 today.   Assessment and Plan: * Bradycardia Paroxysmal atrial fibrillation. 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 - Continue with Eliquis   Hyperosmolar hyperglycemic state (HHS) (HCC) Uncontrolled with hyperglycemia and A1c of 13.1. Apparently forgetting to take his home meds and insulin  - Continue with basal and SSI  AKI (acute kidney injury) (HCC) -Creatinine trending down with IV fluids  Hyperkalemia -Resolved  Failure to thrive in adult/dementia Patient's daughter reports he is unable to care for himself as independent living have consulted transition of care for transition to either ALF or skilled nursing portion - PT  is recommending SNF - Family is planning transitioning to assisted living after rehab  Cellulitis Mild if any of the right lower extremity  -no leukocytosis with WBC 5.7 afebrile  -on cefazolin  - Low threshold to stop antibiotics completely  Secondary hypercoagulability disorder (HCC) Due to atrial fibrillation by history also history of DVT -continue with Eliquis  2.5 mg twice a day  Hyponatremia Pseudohyponatremia in the setting of hyperglycemia - Resolved  Elevated troponin Troponin 38, 39 likely secondary to the AKI on the CKD no ischemic chest pain although history of coronary artery disease status post stenting  -echo-EF 55 to 60%  GERD (gastroesophageal reflux disease) Continue his proton pump inhibitor   OSA (obstructive sleep apnea) CPAP at night  Essential hypertension Blood pressure started trending up -Home ramipril  was restarted   DVT prophylaxis: apixaban  (ELIQUIS ) tablet 2.5 mg Start: 03/07/24 2200 apixaban  (ELIQUIS ) tablet 2.5 mg    Code Status: Limited: Do not attempt resuscitation (DNR) -DNR-LIMITED -Do Not Intubate/DNI  Family Communication: Discussed with daughter at bedside  Disposition Plan:  Level of care: Telemetry Cardiac Status is: Inpatient    Consultants:  Cardiology   Subjective: Patient was seen and examined today.  No new concern.  Objective: Vitals:   03/09/24 1430 03/09/24 1500 03/09/24 1512 03/09/24 1527  BP:   (!) 143/53 138/68  Pulse: (!) 40 (!) 52  (!) 41  Resp: 17 (!) 26 15   Temp:   98.9 F (37.2 C) 98.2 F (36.8 C)  TempSrc:   Oral Oral  SpO2: 98% 95%  100%  Weight:      Height:        Intake/Output Summary (Last 24 hours) at 03/09/2024 1807 Last data filed at 03/09/2024 1520 Gross per 24 hour  Intake  307.4 ml  Output 2550 ml  Net -2242.6 ml   Filed Weights   03/07/24 2010 03/07/24 2050 03/08/24 0725  Weight: 88.5 kg 91.2 kg 93 kg    Examination:   General.  Frail elderly man, in no acute  distress. Pulmonary.  Lungs clear bilaterally, normal respiratory effort. CV.  Regular rate and rhythm, no JVD, rub or murmur. Abdomen.  Soft, nontender, nondistended, BS positive. CNS.  Alert and oriented .  No focal neurologic deficit. Extremities.  1+ LE edema,  pulses intact and symmetrical.  Data Reviewed: I have personally reviewed following labs and imaging studies  CBC: Recent Labs  Lab 03/07/24 1511 03/08/24 0446 03/09/24 0422  WBC 5.7 7.3 6.3  HGB 11.0* 10.4* 11.3*  HCT 33.0* 31.6* 34.0*  MCV 92.2 92.4 93.2  PLT 156 145* 143*   Basic Metabolic Panel: Recent Labs  Lab 03/07/24 1511 03/07/24 1954 03/07/24 2300 03/08/24 0315 03/08/24 0446 03/09/24 0422  NA 126* 132* 135 136 135 136  K 6.2* 4.9 3.8 3.8 3.9 4.5  CL 95* 98 102 102 104 105  CO2 22 28 24 26 25 25   GLUCOSE 603* 529* 386* 164* 137* 212*  BUN 38* 37* 35* 32* 30* 24*  CREATININE 1.62* 1.72* 1.54* 1.38* 1.32* 1.26*  CALCIUM  8.4* 9.0 8.5* 8.8* 8.6* 8.6*  MG 1.9  --   --   --   --   --    GFR: Estimated Creatinine Clearance: 53.5 mL/min (A) (by C-G formula based on SCr of 1.26 mg/dL (H)). Liver Function Tests: Recent Labs  Lab 03/07/24 1954 03/08/24 0446  AST 30 24  ALT 27 20  ALKPHOS 62 50  BILITOT 0.9 0.7  PROT 5.8* 5.1*  ALBUMIN 3.4* 2.9*   No results for input(s): LIPASE, AMYLASE in the last 168 hours. Recent Labs  Lab 03/07/24 2300  AMMONIA <13   Coagulation Profile: No results for input(s): INR, PROTIME in the last 168 hours. Cardiac Enzymes: Recent Labs  Lab 03/07/24 1954  CKTOTAL 177   BNP (last 3 results) No results for input(s): PROBNP in the last 8760 hours. HbA1C: Recent Labs    03/07/24 1954  HGBA1C 13.1*   CBG: Recent Labs  Lab 03/08/24 2318 03/09/24 0416 03/09/24 0724 03/09/24 1120 03/09/24 1608  GLUCAP 141* 198* 261* 274* 354*   Lipid Profile: No results for input(s): CHOL, HDL, LDLCALC, TRIG, CHOLHDL, LDLDIRECT in the last 72  hours. Thyroid Function Tests: Recent Labs    03/07/24 1954  TSH 2.306   Anemia Panel: Recent Labs    03/07/24 1954  VITAMINB12 747   Sepsis Labs: No results for input(s): PROCALCITON, LATICACIDVEN in the last 168 hours.  Recent Results (from the past 240 hours)  MRSA Next Gen by PCR, Nasal     Status: None   Collection Time: 03/07/24  9:11 PM   Specimen: Nasal Mucosa; Nasal Swab  Result Value Ref Range Status   MRSA by PCR Next Gen NOT DETECTED NOT DETECTED Final    Comment: (NOTE) The GeneXpert MRSA Assay (FDA approved for NASAL specimens only), is one component of a comprehensive MRSA colonization surveillance program. It is not intended to diagnose MRSA infection nor to guide or monitor treatment for MRSA infections. Test performance is not FDA approved in patients less than 55 years old. Performed at [facility], [address], [place], [place] [address]      Radiology Studies: ECHOCARDIOGRAM COMPLETE Result Date: 03/08/2024    ECHOCARDIOGRAM REPORT  Patient Name:   JOAO MCCURDY Mogel Date of Exam: 03/08/2024 Medical Rec #:  [id number]          Height:       74.0 in Accession #:    [id number]         Weight:       205.0 lb Date of Birth:  05-04-1941          BSA:          2.197 m Patient Age:    83 years           BP:           117/55 mmHg Patient Gender: M                  HR:           41 bpm. Exam Location:  [facility] Procedure: 2D Echo, Cardiac Doppler, Color Doppler and Strain Analysis (Both            Spectral and Color Flow Doppler were utilized during procedure). Indications:     Abnormal ECG R94.31  History:         Patient has no prior history of Echocardiogram examinations.  Sonographer:     Thedora Louder RDCS, FASE Referring Phys:  [id number] PRENTICE BROCKS CORE Diagnosing Phys: Cara JONETTA Lovelace MD  Sonographer Comments: Global longitudinal strain was attempted. IMPRESSIONS  1. Left ventricular ejection fraction, by estimation, is 55 to 60%. The left ventricle  has normal function. The left ventricle has no regional wall motion abnormalities. Left ventricular diastolic parameters were normal. The average left ventricular global longitudinal strain is 18.3 %. The global longitudinal strain is normal.  2. Right ventricular systolic function is normal. The right ventricular size is normal.  3. The mitral valve is normal in structure. Trivial mitral valve regurgitation.  4. The aortic valve is normal in structure. Aortic valve regurgitation is not visualized. FINDINGS  Left Ventricle: Left ventricular ejection fraction, by estimation, is 55 to 60%. The left ventricle has normal function. The left ventricle has no regional wall motion abnormalities. The average left ventricular global longitudinal strain is 18.3 %. Strain was performed and the global longitudinal strain is normal. The left ventricular internal cavity size was normal in size. There is no left ventricular hypertrophy. Left ventricular diastolic parameters were normal. Right Ventricle: The right ventricular size is normal. No increase in right ventricular wall thickness. Right ventricular systolic function is normal. Left Atrium: Left atrial size was normal in size. Right Atrium: Right atrial size was normal in size. Pericardium: There is no evidence of pericardial effusion. Mitral Valve: The mitral valve is normal in structure. Trivial mitral valve regurgitation. Tricuspid Valve: The tricuspid valve is normal in structure. Tricuspid valve regurgitation is mild. Aortic Valve: The aortic valve is normal in structure. Aortic valve regurgitation is not visualized. Aortic valve peak gradient measures 7.1 mmHg. Pulmonic Valve: The pulmonic valve was normal in structure. Pulmonic valve regurgitation is not visualized. Aorta: The ascending aorta was not well visualized. IAS/Shunts: No atrial level shunt detected by color flow Doppler. Additional Comments: 3D was performed not requiring image post processing on an  independent workstation and was normal.  LEFT VENTRICLE PLAX 2D LVIDd:         4.80 cm   Diastology LVIDs:         3.40 cm   LV e' medial:    7.40 cm/s LV PW:         1.00  cm   LV E/e' medial:  9.2 LV IVS:        1.00 cm   LV e' lateral:   10.40 cm/s LVOT diam:     2.20 cm   LV E/e' lateral: 6.5 LV SV:         91 LV SV Index:   41        2D Longitudinal Strain LVOT Area:     3.80 cm  2D Strain GLS (A4C):   15.3 %                          2D Strain GLS (A3C):   20.5 %                          2D Strain GLS (A2C):   19.0 %                          2D Strain GLS Avg:     18.3 % RIGHT VENTRICLE RV Basal diam:  3.60 cm RV S prime:     15.60 cm/s TAPSE (M-mode): 2.4 cm LEFT ATRIUM           Index        RIGHT ATRIUM           Index LA diam:      4.10 cm 1.87 cm/m   RA Area:     26.50 cm LA Vol (A2C): 44.5 ml 20.26 ml/m  RA Volume:   99.10 ml  45.12 ml/m LA Vol (A4C): 35.2 ml 16.03 ml/m  AORTIC VALVE                 PULMONIC VALVE AV Area (Vmax): 3.00 cm     PV Vmax:        1.14 m/s AV Vmax:        133.00 cm/s  PV Peak grad:   5.2 mmHg AV Peak Grad:   7.1 mmHg     RVOT Peak grad: 3 mmHg LVOT Vmax:      105.00 cm/s LVOT Vmean:     75.000 cm/s LVOT VTI:       0.239 m  AORTA Ao Root diam: 3.50 cm Ao Asc diam:  3.10 cm MITRAL VALVE               TRICUSPID VALVE MV Area (PHT): 2.74 cm    TR Peak grad:   24.0 mmHg MV Decel Time: 277 msec    TR Vmax:        245.00 cm/s MV E velocity: 68.10 cm/s MV A velocity: 52.90 cm/s  SHUNTS MV E/A ratio:  1.29        Systemic VTI:  0.24 m                            Systemic Diam: 2.20 cm Cara JONETTA Lovelace MD Electronically signed by Cara JONETTA Lovelace MD Signature Date/Time: 03/08/2024/9:44:31 AM    Final    US  RENAL Result Date: 03/07/2024 CLINICAL DATA:  Acute renal injury EXAM: RENAL / URINARY TRACT ULTRASOUND COMPLETE COMPARISON:  None Available. FINDINGS: Right Kidney: Renal measurements: 9.9 x 4.9 x 5.4 cm. = volume: 137 mL. Increased echogenicity is noted. No mass or  hydronephrosis is seen. Left Kidney: Renal measurements: 9.5 x 4.8 x 4.8 cm. = volume: 114 mL. Increased echogenicity  is noted. No mass or hydronephrosis is noted. Bladder: Appears normal for degree of bladder distention. Other: None. IMPRESSION: Changes consistent with medical renal disease. No other focal abnormality is noted. Electronically Signed   By: Oneil Devonshire M.D.   On: 03/07/2024 22:13   US  Venous Img Lower Unilateral Right (DVT) Result Date: 03/07/2024 CLINICAL DATA:  Right lower extremity swelling EXAM: RIGHT LOWER EXTREMITY VENOUS DOPPLER ULTRASOUND TECHNIQUE: Gray-scale sonography with graded compression, as well as color Doppler and duplex ultrasound were performed to evaluate the lower extremity deep venous systems from the level of the common femoral vein and including the common femoral, femoral, profunda femoral, popliteal and calf veins including the posterior tibial, peroneal and gastrocnemius veins when visible. The superficial great saphenous vein was also interrogated. Spectral Doppler was utilized to evaluate flow at rest and with distal augmentation maneuvers in the common femoral, femoral and popliteal veins. COMPARISON:  None Available. FINDINGS: Contralateral Common Femoral Vein: Respiratory phasicity is normal and symmetric with the symptomatic side. No evidence of thrombus. Normal compressibility. Common Femoral Vein: No evidence of thrombus. Normal compressibility, respiratory phasicity and response to augmentation. Saphenofemoral Junction: No evidence of thrombus. Normal compressibility and flow on color Doppler imaging. Profunda Femoral Vein: No evidence of thrombus. Normal compressibility and flow on color Doppler imaging. Femoral Vein: Thrombus is noted with decreased compressibility. Popliteal Vein: Thrombus is noted with decreased compressibility. Calf Veins: Not well visualized Superficial Great Saphenous Vein: No evidence of thrombus. Normal compressibility. Venous Reflux:   None. Other Findings:  None. 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 Electronically Signed   By: Oneil Devonshire M.D.   On: 03/07/2024 22:12   Portable chest x-ray (1 view) Result Date: 03/07/2024 CLINICAL DATA:  Bradycardia EXAM: PORTABLE CHEST 1 VIEW COMPARISON:  05/05/2019 FINDINGS: The lungs are symmetrically hyperinflated in keeping with changes of underlying COPD. 15 mm nodular opacity seen at the left costophrenic angle, indeterminate but new since prior examination. Lungs are otherwise clear. No pneumothorax or pleural effusion. Cardiac size is mildly enlarged, stable. Pulmonary vascularity is normal. Osseous structures are age appropriate. IMPRESSION: 1. No radiographic evidence of acute cardiopulmonary disease. 2. COPD. 3. 15 mm nodular opacity at the left costophrenic angle, indeterminate. This could be better assessed with nonemergent CT imaging if indicated. Electronically Signed   By: Dorethia Molt M.D.   On: 03/07/2024 20:19   CT HEAD WO CONTRAST ( ) Result Date: 03/07/2024 CLINICAL DATA:  Delirium EXAM: CT HEAD WITHOUT CONTRAST TECHNIQUE: Contiguous axial images were obtained from the base of the skull through the vertex without intravenous contrast. RADIATION DOSE REDUCTION: This exam was performed according to the departmental dose-optimization program which includes automated exposure control, adjustment of the mA and/or kV according to patient size and/or use of iterative reconstruction technique. COMPARISON:  07/20/2008 FINDINGS: Brain: There is no mass, hemorrhage or extra-axial collection. The appearance of the white matter is normal for the patient's age. There is generalized atrophy. Vascular: Atherosclerotic calcification of the internal carotid arteries at the skull base. No abnormal hyperdensity of the major intracranial arteries or dural venous sinuses. Skull: The visualized skull base, calvarium and extracranial  soft tissues are normal. Sinuses/Orbits: Bilateral maxillary sinus mucosal thickening. Small amount of right mastoid fluid. Normal orbits. Other: None. IMPRESSION: 1. No acute intracranial abnormality. 2. Bilateral maxillary sinus mucosal thickening. 3. Small amount of right mastoid fluid. Electronically Signed   By: Franky Stanford M.D.   On: 03/07/2024  20:16   Scheduled Meds:  apixaban   2.5 mg Oral BID   Chlorhexidine  Gluconate Cloth  6 each Topical QHS   insulin  aspart  0-15 Units Subcutaneous TID WC   insulin  aspart  0-5 Units Subcutaneous QHS   insulin  glargine-yfgn  30 Units Subcutaneous Daily   loratadine   10 mg Oral Daily   pantoprazole   40 mg Oral Daily   ramipril   2.5 mg Oral Daily   rosuvastatin   40 mg Oral Daily   Continuous Infusions:   ceFAZolin  (ANCEF ) IV 2 g (03/09/24 1508)     LOS: 2 days    Time spent: 50 minutes spent on chart review, discussion with nursing staff, consultants, updating family and interview/physical exam; more than 50% of that time was spent in counseling and/or coordination of care.  This record has been created using Conservation officer, historic buildings. Errors have been sought and corrected,but may not always be located. Such creation errors do not reflect on the standard of care.   Amaryllis Dare, MD [facility] Available via Epic secure chat 7am-7pm After these hours, please refer to coverage provider listed on [email or web address] 03/09/2024, 6:07 PM

## 2024-03-09 NOTE — Evaluation (Signed)
 Occupational Therapy Evaluation Patient Details Name: Carl Yang MRN: [id number] DOB: 12/04/1940 Today's Date: 03/09/2024   History of Present Illness   Pt is an 83 y/o M admitted on 03/08/24 after presenting with c/o increasing confusion. Pt is being treated for bradycardia, hyperosmolar hyperglycemic state. Pt also has BLE cellulitis, followed by podiatry. PMH: mixed dementia, DM1, DVT, a-fib, CAD s/p PCI, HTN, CKD 3   Clinical Impressions Pt was seen for OT evaluation and co-tx with PT to optimize safety for activity given HR. Prior to hospital admission, pt was living by himself in an ILF condo and receiving assist from facility for cleaning and meals. Pt endorses he has been managing his own medications and doing short distance driving but does acknowledge that he needs additional assistance. Pt/family report plan to transition to higher level of care is in the works. Pt presents to acute OT demonstrating impaired ADL performance and functional mobility 2/2 decreased strength, balance, activity tolerance, HR in 30's-70's during session, and cognitive impairments (See OT problem list for additional functional deficits). Pt currently requires supv for bed mobility, STS with CGA, and amb with CGA-MIN A using hurry cane (+2 for safety/lines mgt for session). Difficulty maintaining dynamic sitting balance with donning shoes EOB. Pt would benefit from skilled OT services to address noted impairments and functional limitations (see below for any additional details) in order to maximize safety and independence while minimizing falls risk and caregiver burden. Anticipate the need for follow up OT services upon acute hospital DC.    If plan is discharge home, recommend the following:   A little help with walking and/or transfers;A little help with bathing/dressing/bathroom;Assistance with cooking/housework;Assist for transportation;Direct supervision/assist for medications management;Direct  supervision/assist for financial management;Supervision due to cognitive status;Help with stairs or ramp for entrance    Functional Status Assessment   Patient has had a recent decline in their functional status and demonstrates the ability to make significant improvements in function in a reasonable and predictable amount of time.    Equipment Recommendations   Other (comment) (2WW)      Precautions/Restrictions   Precautions Precautions: Fall Recall of Precautions/Restrictions: Impaired Restrictions Weight Bearing Restrictions Per Provider Order: No     Mobility Bed Mobility Overal bed mobility: Needs Assistance Bed Mobility: Supine to Sit     Supine to sit: Modified independent (Device/Increase time), HOB elevated, Used rails          Transfers Overall transfer level: Needs assistance Equipment used:  (hurry cane) Transfers: Sit to/from Stand Sit to Stand: Contact guard assist        Balance Overall balance assessment: Needs assistance Sitting-balance support: Feet supported Sitting balance-Leahy Scale: Fair Sitting balance - Comments: fair to fair- when donning shoes initially attempting to bring foot up to him resulting in slight posterior LOB requiring CGA-MIN A to correct   Standing balance support: Single extremity supported, During functional activity, Reliant on assistive device for balance Standing balance-Leahy Scale: Poor       ADL either performed or assessed with clinical judgement   ADL Overall ADL's : Needs assistance/impaired     Lower Body Dressing: Sit to/from stand;Minimal assistance Lower Body Dressing Details (indicate cue type and reason): difficulty maintaining dyn sitting balance while donning shoes EOB             Functional mobility during ADLs: Contact guard assist;Minimal assistance;Cueing for safety;Cane        Pertinent Vitals/Pain Pain Assessment Pain Assessment: No/denies pain  Extremity/Trunk  Assessment Upper Extremity Assessment Upper Extremity Assessment: Generalized weakness   Lower Extremity Assessment Lower Extremity Assessment: Generalized weakness   Cervical / Trunk Assessment Cervical / Trunk Assessment: Kyphotic   Communication Communication Communication: No apparent difficulties   Cognition Arousal: Alert Behavior During Therapy: WFL for tasks assessed/performed Cognition: History of cognitive impairments             OT - Cognition Comments: Pt alert and pleasant, agreeable, able to follow cues, poor memory but acknowledges some cognitive deficits                 Following commands: Intact       Cueing  General Comments   Cueing Techniques: Verbal cues  HR 38-70's bpm           Home Living Family/patient expects to be discharged to:: Private residence Living Arrangements: Alone Available Help at Discharge: Available PRN/intermittently (ILF staff) Type of Home: Independent living facility Home Access: Level entry     Home Layout: One level     Bathroom Shower/Tub: Walk-in shower         Home Equipment: [place] - single point;Standard Environmental consultant;Shower seat (risers on recliner)   Additional Comments: lives at [facility] [place]      Prior Functioning/Environment Prior Level of Function : Independent/Modified Independent;Driving             Mobility Comments: Mod I with SPC, denies falls, drives in [place] but uses ILF transport to [place]. ADLs Comments: Pt reports Mod I for self care and IADLS, limited driving, and going to dinning room for most meals. Pt managing his meds himself but acknowledges he needs assist with this    OT Problem List: Decreased strength;Cardiopulmonary status limiting activity;Decreased cognition;Decreased activity tolerance;Decreased safety awareness;Impaired balance (sitting and/or standing)   OT Treatment/Interventions: Self-care/ADL training;Therapeutic exercise;Therapeutic  activities;Cognitive remediation/compensation;Energy conservation;DME and/or AE instruction;Patient/family education;Balance training      OT Goals(Current goals can be found in the care plan section)   Acute Rehab OT Goals Patient Stated Goal: get better OT Goal Formulation: With patient/family Time For Goal Achievement: 03/23/24 Potential to Achieve Goals: Good ADL Goals Pt Will Perform Lower Body Dressing: sit to/from stand;sitting/lateral leans;with modified independence Pt Will Transfer to Toilet: with modified independence;ambulating (LRAD) Pt Will Perform Toileting - Clothing Manipulation and hygiene: with modified independence;sitting/lateral leans;sit to/from stand Additional ADL Goal #1: Pt will complete all aspects of bathing, primarily from seated position, with supv for safety and PRN VC for safety, 2/2 opportunities.   OT Frequency:  Min 2X/week    Co-evaluation PT/OT/SLP Co-Evaluation/Treatment: Yes Reason for Co-Treatment: For patient/therapist safety;To address functional/ADL transfers;Other (comment) (unsure of pt's ability to tolerate 2 sessions 2/2 low HR) PT goals addressed during session: Mobility/safety with mobility;Proper use of DME;Balance OT goals addressed during session: ADL's and self-care      AM-PAC OT 6 Clicks Daily Activity     Outcome Measure Help from another person eating meals?: None Help from another person taking care of personal grooming?: A Little Help from another person toileting, which includes using toliet, bedpan, or urinal?: A Little Help from another person bathing (including washing, rinsing, drying)?: A Little Help from another person to put on and taking off regular upper body clothing?: A Little Help from another person to put on and taking off regular lower body clothing?: A Little 6 Click Score: 19   End of Session Nurse Communication: Mobility status  Activity Tolerance: Patient tolerated treatment well Patient left: in  chair;with call bell/phone within reach;with chair alarm set;with family/visitor present  OT Visit Diagnosis: Other abnormalities of gait and mobility (R26.89);Muscle weakness (generalized) (M62.81);Other symptoms and signs involving cognitive function                Time: 8840-8777 OT Time Calculation (min): 23 min Charges:  OT General Charges $OT Visit: 1 Visit OT Evaluation $OT Eval Low Complexity: 1 Low  Warren SAUNDERS., MPH, MS, OTR/L ascom [phone] 03/09/24, 1:34 PM

## 2024-03-09 NOTE — Evaluation (Signed)
 Physical Therapy Evaluation Patient Details Name: Carl Yang MRN: [id number] DOB: 08-31-1941 Today's Date: 03/09/2024  History of Present Illness  Pt is an 83 y/o M admitted on 03/08/24 after presenting with c/o increasing confusion. Pt is being treated for bradycardia, hyperosmolar hyperglycemic state. Pt also has BLE cellulitis, followed by podiatry. PMH: mixed dementia, DM1, DVT, a-fib, CAD s/p PCI, HTN, CKD 3  Clinical Impression  Pt seen for PT evaluation with pt agreeable, daughter [name] Mast present during session. Prior to admission pt was living at ILF, mod I with hurry cane, denies falls in the past 6 months, still driving in [place]. On this date, pt is able to complete bed mobility with supervision, sit>Stand with CGA, & ambulate with hurry cane, increasing to also LUE support 2/2 decreased balance. Will continue to follow pt acutely to see if pt becomes more safe & stable with hurry cane or if pt needs to use RW to reduce fall risk. Pt left in chair, set up with lunch tray, daughter present in room.      If plan is discharge home, recommend the following: A little help with walking and/or transfers;A little help with bathing/dressing/bathroom;Assistance with cooking/housework;Assist for transportation;Help with stairs or ramp for entrance;Supervision due to cognitive status;Direct supervision/assist for financial management   Can travel by private vehicle   Yes    Equipment Recommendations Rolling walker (2 wheels)  Recommendations for Other Services  Rehab consult    Functional Status Assessment Patient has had a recent decline in their functional status and demonstrates the ability to make significant improvements in function in a reasonable and predictable amount of time.     Precautions / Restrictions Precautions Precautions: Fall Restrictions Weight Bearing Restrictions Per Provider Order: No      Mobility  Bed Mobility Overal bed mobility: Needs  Assistance Bed Mobility: Supine to Sit     Supine to sit: Modified independent (Device/Increase time), HOB elevated, Used rails          Transfers Overall transfer level: Needs assistance Equipment used:  (hurry cane) Transfers: Sit to/from Stand Sit to Stand: Contact guard assist                Ambulation/Gait Ambulation/Gait assistance: Min assist, +2 safety/equipment (+2 for line management) Gait Distance (Feet): 35 Feet Assistive device: 1 person hand held assist (hurry cane) Gait Pattern/deviations: Decreased step length - right, Decreased step length - left, Decreased stride length, Trunk flexed Gait velocity: decreased     General Gait Details: Attempts gait with hurry cane in RUE, needs LUE HHA support 2/2 decreased balance, 1 LOB to R min assist to correct.  Stairs            Wheelchair Mobility     Tilt Bed    Modified Rankin (Stroke Patients Only)       Balance Overall balance assessment: Needs assistance Sitting-balance support: Feet supported Sitting balance-Leahy Scale: Good Sitting balance - Comments: dons shoes sitting EOB   Standing balance support: Single extremity supported, During functional activity, Reliant on assistive device for balance Standing balance-Leahy Scale: Poor                               Pertinent Vitals/Pain Pain Assessment Pain Assessment: No/denies pain    Home Living Family/patient expects to be discharged to:: Private residence Living Arrangements: Alone Available Help at Discharge: Available PRN/intermittently (ILF staff) Type of Home: Independent living facility Home  Access: Level entry       Home Layout: One level Home Equipment: Cane - single point;Standard Walker (risers on recliner)      Prior Function Prior Level of Function : Independent/Modified Independent;Driving             Mobility Comments: Mod I with SPC, denies falls, drives in [place] but uses ILF transport to  [place].       Extremity/Trunk Assessment   Upper Extremity Assessment Upper Extremity Assessment: Generalized weakness    Lower Extremity Assessment Lower Extremity Assessment: Generalized weakness    Cervical / Trunk Assessment Cervical / Trunk Assessment: Kyphotic  Communication   Communication Communication: No apparent difficulties    Cognition Arousal: Alert Behavior During Therapy: WFL for tasks assessed/performed   PT - Cognitive impairments: History of cognitive impairments                       PT - Cognition Comments: AxOx4, hx of dementia, daughter provides assistance with PLOF/home set up information when pt needs it Following commands: Intact       Cueing Cueing Techniques: Verbal cues     General Comments General comments (skin integrity, edema, etc.): HR 38-70s bpm    Exercises     Assessment/Plan    PT Assessment Patient needs continued PT services  PT Problem List Decreased strength;Cardiopulmonary status limiting activity;Decreased range of motion;Decreased cognition;Decreased activity tolerance;Decreased balance;Decreased mobility;Decreased knowledge of use of DME;Decreased safety awareness       PT Treatment Interventions DME instruction;Balance training;Gait training;Neuromuscular re-education;Functional mobility training;Therapeutic activities;Therapeutic exercise;Patient/family education    PT Goals (Current goals can be found in the Care Plan section)  Acute Rehab PT Goals Patient Stated Goal: get better, return to [facility] PT Goal Formulation: With patient/family Time For Goal Achievement: 03/23/24 Potential to Achieve Goals: Good    Frequency Min 2X/week     Co-evaluation PT/OT/SLP Co-Evaluation/Treatment: Yes Reason for Co-Treatment: Other (comment) (unsure of pt's ability to tolerate 2 sessions 2/2 low HR) PT goals addressed during session: Mobility/safety with mobility;Proper use of DME;Balance          AM-PAC PT 6 Clicks Mobility  Outcome Measure Help needed turning from your back to your side while in a flat bed without using bedrails?: A Little Help needed moving from lying on your back to sitting on the side of a flat bed without using bedrails?: A Little Help needed moving to and from a bed to a chair (including a wheelchair)?: A Little Help needed standing up from a chair using your arms (e.g., wheelchair or bedside chair)?: A Little Help needed to walk in hospital room?: A Little Help needed climbing 3-5 steps with a railing? : A Lot 6 Click Score: 17    End of Session   Activity Tolerance: Patient tolerated treatment well Patient left: in chair;with call bell/phone within reach;with chair alarm set;with family/visitor present (set up with meal tray) Nurse Communication: Mobility status PT Visit Diagnosis: Unsteadiness on feet (R26.81);Muscle weakness (generalized) (M62.81)    Time: 8799-8777 PT Time Calculation (min) (ACUTE ONLY): 22 min   Charges:   PT Evaluation $PT Eval Moderate Complexity: 1 Mod   PT General Charges $$ ACUTE PT VISIT: 1 Visit         Richerd Pinal, PT, DPT 03/09/24, 12:40 PM   Richerd CHRISTELLA Pinal 03/09/2024, 12:37 PM

## 2024-03-09 NOTE — Progress Notes (Cosign Needed Addendum)
 [facility] CLINIC CARDIOLOGY PROGRESS NOTE       Patient ID: Carl Yang MRN: [id number] DOB/AGE: October 19, 1940 83 y.o.  Admit date: 03/07/2024 Referring Physician Dr. Prentice Core Primary Physician Alla Amis, MD Primary Cardiologist Dr. Bosie [phone]) Reason for Consultation bradycardia high grade AVB/ atrial fibrillation  HPI: Carl Yang is a 83 y.o. male  with a past medical history of coronary artery disease s/p stent (1999), paroxymal atrial fibrillation, hypertension, sleep apnea, type 1 diabetes mellitus, hx DVT, CKD stage 3, mixed dementia  who presented to the ED on 03/07/2024 for increasing confusion. Patient brought from independent living facility. Potassium found to be > 6. Cardiology was consulted for further evaluation of bradycardia high grade AVB and paroxymal atrial fibrillation  Interval History: -Patient seen and examined this AM and patient sitting up eating breakfast in no acute distress. Patient states he feels good this AM and denies chest pain, palpitations, SOB, lightheadedness or dizziness. -Per tele this AM in atrial fibrillation rates 70-80s, this afternoon AF with rates in 30s - 40s -Patients BP stable and HR variable.  -Patient remains on room air with stable SpO2.     Review of systems complete and found to be negative unless listed above    Past Medical History:  Diagnosis Date   Arthritis    Atrial fibrillation (HCC)    Cancer (HCC)    Melanoma   Chronic anticoagulation    Warfarin   Complication of anesthesia    After surgery at [facility], the patient coded during the night.   Coronary artery disease    Stents   Diabetes mellitus without complication (HCC)    DVT (deep venous thrombosis) (HCC)    Right lower limb   Dyspnea    GERD (gastroesophageal reflux disease)    Insulin  pump in place    Myocardial infarction (HCC)    1998, 2003   Neuromuscular disorder (HCC)    Peripheral Neuropathy r/t DM   Peripheral vascular disease  (HCC)    Diabetes   Sleep apnea    Uses CPAP    Past Surgical History:  Procedure Laterality Date   AMPUTATION TOE Left    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   AMPUTATION TOE Left 10/09/2018   Procedure: TOE IPJ 2ND TOE LEFT;  Surgeon: Lilli Cough, DPM;  Location: [facility];  Service: Podiatry;  Laterality: Left;  IVA LOCAL Diabeticv - insulin  pump   BACK SURGERY     L4 & L5 Fusion   BONE EXCISION Left 07/08/2020   Procedure: EXCISION BONE METATARSAL LEFT;  Surgeon: Ashley Soulier, DPM;  Location: [facility];  Service: Podiatry;  Laterality: Left;   CARDIAC CATHETERIZATION  2003   CORONARY ANGIOPLASTY WITH STENT PLACEMENT     stents x 2   DIAGNOSTIC LAPAROSCOPY     EYE SURGERY Bilateral    cataracts   INCISION AND DRAINAGE OF WOUND Left 06/07/2023   Procedure: IRRIGATION AND DEBRIDEMENT BONE OF LEFT HEEL;  Surgeon: Neill Boas, DPM;  Location: [facility] OR[facility];  Service: Orthopedics/Podiatry;  Laterality: Left;   LOWER EXTREMITY ANGIOGRAPHY Left 06/06/2023   Procedure: Lower Extremity Angiography;  Surgeon: Jama Cordella MATSU, MD;  Location: [facility] INVASIVE CV LAB;  Service: Cardiovascular;  Laterality: Left;   PARTIAL KNEE ARTHROPLASTY Left 02/09/2020   Procedure: UNICOMPARTMENTAL KNEE;  Surgeon: Edie Norleen PARAS, MD;  Location: [facility] ORS;  Service: Orthopedics;  Laterality: Left;   TONSILLECTOMY     TOTAL KNEE ARTHROPLASTY Right 07/15/2018  Procedure: TOTAL KNEE ARTHROPLASTY;  Surgeon: Edie Norleen PARAS, MD;  Location: [facility] ORS;  Service: Orthopedics;  Laterality: Right;   TOTAL KNEE REVISION Right 05/05/2019   Procedure: TOTAL KNEE REVISION;  Surgeon: Edie Norleen PARAS, MD;  Location: [facility] ORS;  Service: Orthopedics;  Laterality: Right;    Medications Prior to Admission  Medication Sig Dispense Refill Last Dose/Taking   ELIQUIS  2.5 MG TABS tablet Take 2.5 mg by mouth 2 (two) times daily.      esomeprazole  (NEXIUM ) 40 MG capsule Take 1 capsule (40 mg total) by mouth daily. (Patient  taking differently: Take 40 mg by mouth every morning.) 30 capsule 0    fexofenadine (ALLEGRA) 180 MG tablet Take 180 mg by mouth every morning.   1    rosuvastatin  (CRESTOR ) 40 MG tablet Take 40 mg by mouth daily.      Social History   Socioeconomic History   Marital status: Widowed    Spouse name: Not on file   Number of children: Not on file   Years of education: Not on file   Highest education level: Not on file  Occupational History   Not on file  Tobacco Use   Smoking status: Never   Smokeless tobacco: Never  Vaping Use   Vaping status: Never Used  Substance and Sexual Activity   Alcohol use: Yes    Alcohol/week: 1.0 standard drink of alcohol    Types: 1 Glasses of wine per week    Comment: Socially   Drug use: Never   Sexual activity: Not Currently  Other Topics Concern   Not on file  Social History Narrative   Not on file   Social Drivers of Health   Financial Resource Strain: Low Risk  (05/07/2023)   Received from [facility]   Overall Financial Resource Strain (CARDIA)    Difficulty of Paying Living Expenses: Not hard at all  Food Insecurity: No Food Insecurity (03/07/2024)   Hunger Vital Sign    Worried About Running Out of Food in the Last Year: Never true    Ran Out of Food in the Last Year: Never true  Transportation Needs: No Transportation Needs (03/07/2024)   PRAPARE - Administrator, Civil Service (Medical): No    Lack of Transportation (Non-Medical): No  Physical Activity: Not on file  Stress: Not on file  Social Connections: Moderately Isolated (03/07/2024)   Social Connection and Isolation Panel    Frequency of Communication with Friends and Family: More than three times a week    Frequency of Social Gatherings with Friends and Family: Patient unable to answer    Attends Religious Services: 1 to 4 times per year    Active Member of [employer] or Organizations: No    Attends Banker Meetings: Patient unable to  answer    Marital Status: Widowed  Intimate Partner Violence: Not At Risk (03/07/2024)   Humiliation, Afraid, Rape, and Kick questionnaire    Fear of Current or Ex-Partner: No    Emotionally Abused: No    Physically Abused: No    Sexually Abused: No    History reviewed. No pertinent family history.   Vitals:   03/08/24 1900 03/08/24 2000 03/08/24 2210 03/09/24 0000  BP: (!) 149/57 (!) 157/107 (!) 137/97 (!) 148/62  Pulse: (!) 42 (!) 44 (!) 42 (!) 144  Resp: 15 11 13 14   Temp:  98.4 F (36.9 C)    TempSrc:  Oral    SpO2: 97%  99% 100% 99%  Weight:      Height:        PHYSICAL EXAM General: well appearing elderly male, well nourished, in no acute distress. HEENT: Normocephalic and atraumatic. Neck: No JVD.   Lungs: Normal respiratory effort on room air. Clear bilaterally to auscultation. No wheezes, crackles, rhonchi.  Heart: Irregularly, irregular, variable rate. Normal S1 and S2 without gallops or murmurs.  Abdomen: Non-distended appearing.  Msk: Normal strength and tone for age. Extremities: Warm and well perfused. No clubbing, cyanosis, edema. Right foot ulcer.  Neuro: Alert and oriented X 3. Psych: Answers questions appropriately.   Labs: Basic Metabolic Panel: Recent Labs    03/07/24 1511 03/07/24 1954 03/08/24 0446 03/09/24 0422  NA 126*   < > 135 136  K 6.2*   < > 3.9 4.5  CL 95*   < > 104 105  CO2 22   < > 25 25  GLUCOSE 603*   < > 137* 212*  BUN 38*   < > 30* 24*  CREATININE 1.62*   < > 1.32* 1.26*  CALCIUM  8.4*   < > 8.6* 8.6*  MG 1.9  --   --   --    < > = values in this interval not displayed.   Liver Function Tests: Recent Labs    03/07/24 1954 03/08/24 0446  AST 30 24  ALT 27 20  ALKPHOS 62 50  BILITOT 0.9 0.7  PROT 5.8* 5.1*  ALBUMIN 3.4* 2.9*   No results for input(s): LIPASE, AMYLASE in the last 72 hours. CBC: Recent Labs    03/08/24 0446 03/09/24 0422  WBC 7.3 6.3  HGB 10.4* 11.3*  HCT 31.6* 34.0*  MCV 92.4 93.2  PLT 145*  143*   Cardiac Enzymes: Recent Labs    03/07/24 1511 03/07/24 1740 03/07/24 1954  CKTOTAL  --   --  177  TROPONINIHS 38* 39*  --    BNP: No results for input(s): BNP in the last 72 hours. D-Dimer: No results for input(s): DDIMER in the last 72 hours. Hemoglobin A1C: Recent Labs    03/07/24 1954  HGBA1C 13.1*   Fasting Lipid Panel: No results for input(s): CHOL, HDL, LDLCALC, TRIG, CHOLHDL, LDLDIRECT in the last 72 hours. Thyroid Function Tests: Recent Labs    03/07/24 1954  TSH 2.306   Anemia Panel: Recent Labs    03/07/24 1954  VITAMINB12 747     Radiology: ECHOCARDIOGRAM COMPLETE Result Date: 03/08/2024    ECHOCARDIOGRAM REPORT   Patient Name:   Carl Yang Nida Date of Exam: 03/08/2024 Medical Rec #:  [id number]          Height:       74.0 in Accession #:    [id number]         Weight:       205.0 lb Date of Birth:  1941-01-26          BSA:          2.197 m Patient Age:    83 years           BP:           117/55 mmHg Patient Gender: M                  HR:           41 bpm. Exam Location:  [facility] Procedure: 2D Echo, Cardiac Doppler, Color Doppler and Strain Analysis (Both  Spectral and Color Flow Doppler were utilized during procedure). Indications:     Abnormal ECG R94.31  History:         Patient has no prior history of Echocardiogram examinations.  Sonographer:     Thedora Louder RDCS, FASE Referring Phys:  [id number] PRENTICE BROCKS CORE Diagnosing Phys: Cara JONETTA Lovelace MD  Sonographer Comments: Global longitudinal strain was attempted. IMPRESSIONS  1. Left ventricular ejection fraction, by estimation, is 55 to 60%. The left ventricle has normal function. The left ventricle has no regional wall motion abnormalities. Left ventricular diastolic parameters were normal. The average left ventricular global longitudinal strain is 18.3 %. The global longitudinal strain is normal.  2. Right ventricular systolic function is normal. The right ventricular size is  normal.  3. The mitral valve is normal in structure. Trivial mitral valve regurgitation.  4. The aortic valve is normal in structure. Aortic valve regurgitation is not visualized. FINDINGS  Left Ventricle: Left ventricular ejection fraction, by estimation, is 55 to 60%. The left ventricle has normal function. The left ventricle has no regional wall motion abnormalities. The average left ventricular global longitudinal strain is 18.3 %. Strain was performed and the global longitudinal strain is normal. The left ventricular internal cavity size was normal in size. There is no left ventricular hypertrophy. Left ventricular diastolic parameters were normal. Right Ventricle: The right ventricular size is normal. No increase in right ventricular wall thickness. Right ventricular systolic function is normal. Left Atrium: Left atrial size was normal in size. Right Atrium: Right atrial size was normal in size. Pericardium: There is no evidence of pericardial effusion. Mitral Valve: The mitral valve is normal in structure. Trivial mitral valve regurgitation. Tricuspid Valve: The tricuspid valve is normal in structure. Tricuspid valve regurgitation is mild. Aortic Valve: The aortic valve is normal in structure. Aortic valve regurgitation is not visualized. Aortic valve peak gradient measures 7.1 mmHg. Pulmonic Valve: The pulmonic valve was normal in structure. Pulmonic valve regurgitation is not visualized. Aorta: The ascending aorta was not well visualized. IAS/Shunts: No atrial level shunt detected by color flow Doppler. Additional Comments: 3D was performed not requiring image post processing on an independent workstation and was normal.  LEFT VENTRICLE PLAX 2D LVIDd:         4.80 cm   Diastology LVIDs:         3.40 cm   LV e' medial:    7.40 cm/s LV PW:         1.00 cm   LV E/e' medial:  9.2 LV IVS:        1.00 cm   LV e' lateral:   10.40 cm/s LVOT diam:     2.20 cm   LV E/e' lateral: 6.5 LV SV:         91 LV SV Index:    41        2D Longitudinal Strain LVOT Area:     3.80 cm  2D Strain GLS (A4C):   15.3 %                          2D Strain GLS (A3C):   20.5 %                          2D Strain GLS (A2C):   19.0 %  2D Strain GLS Avg:     18.3 % RIGHT VENTRICLE RV Basal diam:  3.60 cm RV S prime:     15.60 cm/s TAPSE (M-mode): 2.4 cm LEFT ATRIUM           Index        RIGHT ATRIUM           Index LA diam:      4.10 cm 1.87 cm/m   RA Area:     26.50 cm LA Vol (A2C): 44.5 ml 20.26 ml/m  RA Volume:   99.10 ml  45.12 ml/m LA Vol (A4C): 35.2 ml 16.03 ml/m  AORTIC VALVE                 PULMONIC VALVE AV Area (Vmax): 3.00 cm     PV Vmax:        1.14 m/s AV Vmax:        133.00 cm/s  PV Peak grad:   5.2 mmHg AV Peak Grad:   7.1 mmHg     RVOT Peak grad: 3 mmHg LVOT Vmax:      105.00 cm/s LVOT Vmean:     75.000 cm/s LVOT VTI:       0.239 m  AORTA Ao Root diam: 3.50 cm Ao Asc diam:  3.10 cm MITRAL VALVE               TRICUSPID VALVE MV Area (PHT): 2.74 cm    TR Peak grad:   24.0 mmHg MV Decel Time: 277 msec    TR Vmax:        245.00 cm/s MV E velocity: 68.10 cm/s MV A velocity: 52.90 cm/s  SHUNTS MV E/A ratio:  1.29        Systemic VTI:  0.24 m                            Systemic Diam: 2.20 cm Cara JONETTA Lovelace MD Electronically signed by Cara JONETTA Lovelace MD Signature Date/Time: 03/08/2024/9:44:31 AM    Final    US  RENAL Result Date: 03/07/2024 CLINICAL DATA:  Acute renal injury EXAM: RENAL / URINARY TRACT ULTRASOUND COMPLETE COMPARISON:  None Available. FINDINGS: Right Kidney: Renal measurements: 9.9 x 4.9 x 5.4 cm. = volume: 137 mL. Increased echogenicity is noted. No mass or hydronephrosis is seen. Left Kidney: Renal measurements: 9.5 x 4.8 x 4.8 cm. = volume: 114 mL. Increased echogenicity is noted. No mass or hydronephrosis is noted. Bladder: Appears normal for degree of bladder distention. Other: None. IMPRESSION: Changes consistent with medical renal disease. No other focal abnormality is noted.  Electronically Signed   By: Oneil Devonshire M.D.   On: 03/07/2024 22:13   US  Venous Img Lower Unilateral Right (DVT) Result Date: 03/07/2024 CLINICAL DATA:  Right lower extremity swelling EXAM: RIGHT LOWER EXTREMITY VENOUS DOPPLER ULTRASOUND TECHNIQUE: Gray-scale sonography with graded compression, as well as color Doppler and duplex ultrasound were performed to evaluate the lower extremity deep venous systems from the level of the common femoral vein and including the common femoral, femoral, profunda femoral, popliteal and calf veins including the posterior tibial, peroneal and gastrocnemius veins when visible. The superficial great saphenous vein was also interrogated. Spectral Doppler was utilized to evaluate flow at rest and with distal augmentation maneuvers in the common femoral, femoral and popliteal veins. COMPARISON:  None Available. FINDINGS: Contralateral Common Femoral Vein: Respiratory phasicity is normal and symmetric with the symptomatic side. No evidence of  thrombus. Normal compressibility. Common Femoral Vein: No evidence of thrombus. Normal compressibility, respiratory phasicity and response to augmentation. Saphenofemoral Junction: No evidence of thrombus. Normal compressibility and flow on color Doppler imaging. Profunda Femoral Vein: No evidence of thrombus. Normal compressibility and flow on color Doppler imaging. Femoral Vein: Thrombus is noted with decreased compressibility. Popliteal Vein: Thrombus is noted with decreased compressibility. Calf Veins: Not well visualized Superficial Great Saphenous Vein: No evidence of thrombus. Normal compressibility. Venous Reflux:  None. Other Findings:  None. 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 Electronically Signed   By: Oneil Devonshire M.D.   On: 03/07/2024 22:12   Portable chest x-ray (1 view) Result Date: 03/07/2024 CLINICAL DATA:  Bradycardia EXAM: PORTABLE CHEST 1 VIEW COMPARISON:   05/05/2019 FINDINGS: The lungs are symmetrically hyperinflated in keeping with changes of underlying COPD. 15 mm nodular opacity seen at the left costophrenic angle, indeterminate but new since prior examination. Lungs are otherwise clear. No pneumothorax or pleural effusion. Cardiac size is mildly enlarged, stable. Pulmonary vascularity is normal. Osseous structures are age appropriate. IMPRESSION: 1. No radiographic evidence of acute cardiopulmonary disease. 2. COPD. 3. 15 mm nodular opacity at the left costophrenic angle, indeterminate. This could be better assessed with nonemergent CT imaging if indicated. Electronically Signed   By: Dorethia Molt M.D.   On: 03/07/2024 20:19   CT HEAD WO CONTRAST ( ) Result Date: 03/07/2024 CLINICAL DATA:  Delirium EXAM: CT HEAD WITHOUT CONTRAST TECHNIQUE: Contiguous axial images were obtained from the base of the skull through the vertex without intravenous contrast. RADIATION DOSE REDUCTION: This exam was performed according to the departmental dose-optimization program which includes automated exposure control, adjustment of the mA and/or kV according to patient size and/or use of iterative reconstruction technique. COMPARISON:  07/20/2008 FINDINGS: Brain: There is no mass, hemorrhage or extra-axial collection. The appearance of the white matter is normal for the patient's age. There is generalized atrophy. Vascular: Atherosclerotic calcification of the internal carotid arteries at the skull base. No abnormal hyperdensity of the major intracranial arteries or dural venous sinuses. Skull: The visualized skull base, calvarium and extracranial soft tissues are normal. Sinuses/Orbits: Bilateral maxillary sinus mucosal thickening. Small amount of right mastoid fluid. Normal orbits. Other: None. IMPRESSION: 1. No acute intracranial abnormality. 2. Bilateral maxillary sinus mucosal thickening. 3. Small amount of right mastoid fluid. Electronically Signed   By: Franky Stanford  M.D.   On: 03/07/2024 20:16    ECHO as above  TELEMETRY reviewed by me 03/09/2024: atrial fibrillation rates 70-80s (AM), this afternoon AF with rates in 30s - 40s  EKG reviewed by me: third-degree AV block A-V dissociation, 39 bpm 03/07/24:  Atrial fibrillation, 90 bpm with nonspecific ST-T wave changes  Data reviewed by me 03/09/2024: last 24h vitals tele labs imaging I/O hospitalist progress notes.  Principal Problem:   Bradycardia Active Problems:   Essential hypertension   OSA (obstructive sleep apnea)   GERD (gastroesophageal reflux disease)   Hyperosmolar hyperglycemic state (HHS) (HCC)   AKI (acute kidney injury) (HCC)   Elevated troponin   Hyperkalemia   Hyponatremia   Secondary hypercoagulability disorder (HCC)   Acute metabolic encephalopathy   Cellulitis   Failure to thrive in adult   Dementia [facility])    ASSESSMENT AND PLAN:  Junie Engram is a 83 y.o. male  with a past medical history of coronary artery disease s/p stent (1999), paroxymal atrial fibrillation, hypertension, sleep apnea, type 1 diabetes mellitus,  hx DVT, CKD stage 3, mixed dementia  who presented to the ED on 03/07/2024 for increasing confusion. Patient brought from independent living facility. Potassium found to be > 6. Cardiology was consulted for further evaluation of bradycardia high grade AVB and paroxymal atrial fibrillation  # Bradycardia with transient complete heart block # Paroxsymal atrial fibrillation  # Hyperkalemia, resolved EKG with evidence of third degree AVB, AV dissociation with rate 30s. EKG with atrial fibrillation rate 90s. Per tele this AM atrial fibrillation rates 70-80s, this afternoon AF with rates in 30s - 40s. -Keep potassium less than 4. Monitor closely -Avoid all AVN blockers. -Continue Eliquis  2.5 mg twice daily for stroke risk reduction.  -Do not recommend temporary or permanent pacemaker at this stage because of comorbid disease. Would recommend conservative  management as per family's request.   # Mixed Dementia # Acute metabolic encephalopathy  Patient presents with increasing confusion from independent living facility. Patient states he's feeling well and answers all questions appropriately this AM. -Management per primary team.  # Hypertension # Hyperlipidemia # Coronary artery disease s/p stent (1999) EKG without acute ischemic changes. Trops minimally elevated and flat 38 > 39. Echo this admission with pEF, no RWMA, no significant valvular dysfunction.  -Continue Crestor  40 mg daily.  -Resume home ramipril  2.5 mg daily.  -Minimally elevated and flat is most consistent with demand/supply mismatch and not ACS   This patient's plan of care was discussed and created with Dr. Florencio and he is in agreement.  Signed: Dorene Comfort, PA-C  03/09/2024, 6:49 AM [facility] Cardiology

## 2024-03-09 NOTE — TOC Progression Note (Signed)
 Transition of Care [facility]) - Progression Note    Patient Details  Name: Carl Yang MRN: [id number] Date of Birth: 1941/03/10  Transition of Care [facility]) CM/SW Contact  Alvaro Louder, [place] Phone Number: 03/09/2024, 2:36 PM  Clinical Narrative:   FL2 completed, faxed to [facility].     Expected Discharge Plan: Skilled Nursing Facility Barriers to Discharge: Continued Medical Work up  Expected Discharge Plan and Services   Discharge Planning Services: CM Consult Post Acute Care Choice: Skilled Nursing Facility Living arrangements for the past 2 months: Independent Living Facility ([facility])                                       Social Determinants of Health (SDOH) Interventions SDOH Screenings   Food Insecurity: No Food Insecurity (03/07/2024)  Housing: Low Risk  (03/07/2024)  Transportation Needs: No Transportation Needs (03/07/2024)  Utilities: Not At Risk (03/07/2024)  Depression (PHQ2-9): Low Risk  (08/18/2019)  Financial Resource Strain: Low Risk  (05/07/2023)   Received from [facility]  Social Connections: Moderately Isolated (03/07/2024)  Tobacco Use: Low Risk  (03/07/2024)    Readmission Risk Interventions     No data to display

## 2024-03-09 NOTE — Inpatient Diabetes Management (Addendum)
 Inpatient Diabetes Program Recommendations  AACE/ADA: New Consensus Statement on Inpatient Glycemic Control (2015)  Target Ranges:  Prepandial:   less than 140 mg/dL      Peak postprandial:   less than 180 mg/dL (1-2 hours)      Critically ill patients:  140 - 180 mg/dL    Latest Reference Range & Units 03/07/24 19:54  Hemoglobin A1C 4.8 - 5.6 % 13.1 (H)  329 mg/dl  (H): Data is abnormally high  Latest Reference Range & Units 03/07/24 15:11  Glucose 70 - 99 mg/dL 396 (HH)  (HH): Data is critically high  Latest Reference Range & Units 03/08/24 09:02 03/08/24 10:05 03/08/24 11:59 03/08/24 16:08 03/08/24 19:59 03/08/24 21:44  Glucose-Capillary 70 - 99 mg/dL 889 (H)  IV Insulin  Drip Running 121 (H)   30 units Semglee @1001  323 (H)  IV Insulin  Drip Stopped  7 units Novolog  367 (H)  15 units Novolog   310 (H) 205 (H)  2 units Novolog    (H): Data is abnormally high  Latest Reference Range & Units 03/08/24 23:18 03/09/24 04:16 03/09/24 07:24  Glucose-Capillary 70 - 99 mg/dL 858 (H) 801 (H) 738 (H)  (H): Data is abnormally high   Admit from ILF with Increasing Confusion/ Bradycardia/ Hyperglycemia/ Hyperkalemia/ Failure to Thrive  History: Type 1 Diabetes, Dementia, CKD  Home DM Meds: Lantus  35 units Daily       Novolog  0-17 units TID per SSI  Current Orders: Novolog  Moderate Correction Scale/ SSI (0-15 units) TID AC + HS     Semglee  30 units daily   Note pt may need higher level of care due to inability to self manage meds including insulin    MD- please consider starting Novolog  3 units TID with meals HOLD if pt NPO HOLD if pt eats <50% meals     Addendum 12:30pm--Met w/ pt and his Dtr at bedside.  Discussed with patient and Dtr treatment of extreme hyperglycemia, lab results, and transition plan to SQ insulin  regimen this AM.  Reviewed current SQ regimen that began yest AM.  Pt and Dtr confirmed pt uses Lantus  and Novolog  now as prescribed by ENDO.  Dtr told me pt  last checked his CBG at the ILF on 06/17 and she is not sure when he last took his insulin  this past week.  TOC team working with pt and Dtr for higher level of care at [facility] [place].  We talked about the need for someone to help with insulin  admin and pt and Dtr both in agreement with higher level of care at [facility].  Asked Dtr to reach out to ENDO office if there are any concerns about the insulin  pt will get once he goes to Skilled nursing at [facility] as Dr. Cherilyn has been following pt closely.       ENDO: Dr. Cherilyn Last Seen 01/23/2024 Pt was using the pump earlier this year, however, currently does not have the cognitive ability to manage the pump independently Was switched to injections Pt was told to take the following:  Lantus  35 units daily Novolog  SSI 0-140, take 0 units, if 140-154 take 1 unit, if 155-169, take 2 units, if 170-184, take 3 units, if 185-199, take 4 units, if 210-214, take 5 units, if 215-229, take 6 units, if 230-244, take 7 units, if 245-259, take 8 units, if 260-274, take 9, if 275-289, take 10 units, if 290-304, take 11 units, if 205-319, take 12 units, if 320-334, take 13 units, if 335-349, take 14 units, if  350-364, take 15 units, if 365-379, take 16 units, if 380 or above, take 17 units     --Will follow patient during hospitalization--  Adina Rudolpho Arrow RN, MSN, CDCES Diabetes Coordinator Inpatient Glycemic Control Team Team Pager: [phone] (8a-5p)

## 2024-03-09 NOTE — TOC Initial Note (Signed)
 Transition of Care [facility]) - Initial/Assessment Note    Patient Details  Name: Carl Yang MRN: [id number] Date of Birth: 29-Apr-1941  Transition of [facility]) CM/SW Contact:    Alvaro Louder, LCSW Phone Number: 03/09/2024, 11:48 AM  Clinical Narrative:  LCSWA met with patient and daughter [name] at bedside. LCSWA described reason for consultation. The patient indicated that he lived by himself at [facility]'s independent living prior to admission. He indicated that he dressed himself and was able to ambulate around his home with the use of a four prone cane. The patient indicated that he has DME in his home such as a shower chair, and shower rails. The patient stated that he drove himself to appointments and had no problem getting medication from [facility] before admittance to the hospital. He has family supports including his daughter [name] who stays 2 hours away and his son in [place] .   His daughter indicated that he has been having trouble managing his medication. She stated that he is no longer need his insulin  through the pump and now uses the shot. LCSWA discussed with the patient and daughter the need to obtain further care such as medication management for the patient and the process of discharge to SNF when applicable. Patient and daughter were agreeable.   Case manager Nichole spoke with Suzen the admissions coordinator at [facility]. Luke reports that she has an available SNF bed when patient is ready for discharge. PT and OT eval pending. Will complete SNF work-up and fax out to [facility] per patient and family request.   TOC will continue to follow for discharge plan  Expected Discharge Plan: Skilled Nursing Facility Barriers to Discharge: Continued Medical Work up   Patient Goals and CMS Choice   CMS Medicare.gov Compare Post Acute Care list provided to:: Patient (Patient from [facility] IL[facility] and chooses to returnto higher level of care at [facility])         Expected Discharge Plan and Services   Discharge Planning Services: CM Consult Post Acute Care Choice: Skilled Nursing Facility Living arrangements for the past 2 months: Independent Living Facility ([facility])                                      Prior Living Arrangements/Services Living arrangements for the past 2 months: Marketing executive ([facility]) Lives with:: Self Patient language and need for interpreter reviewed:: Yes Do you feel safe going back to the place where you live?: Yes      Need for Family Participation in Patient Care: Yes (Comment) Care giver support system in place?: Yes (comment) (Patient has supportive daughter [name] Artist)) Current home services: DME (Four Prone Cane, Paediatric nurse, Arts development officer) Criminal Activity/Legal Involvement Pertinent to Current Situation/Hospitalization: No - Comment as needed  Activities of Daily Living      Permission Sought/Granted Permission sought to share information with : Case Manager                Emotional Assessment Appearance:: Appears stated age Attitude/Demeanor/Rapport: Engaged Affect (typically observed): Appropriate Orientation: : Oriented to Self, Oriented to Place, Oriented to  Time, Oriented to Situation   Psych Involvement: No (comment)  Admission diagnosis:  Bradycardia [R00.1] Hyperglycemia [R73.9] Cardiac arrhythmia, unspecified cardiac arrhythmia type [I49.9] Patient Active Problem List   Diagnosis Date Noted   Bradycardia 03/07/2024   Hyperosmolar hyperglycemic state (HHS) (HCC) 03/07/2024  AKI (acute kidney injury) (HCC) 03/07/2024   Elevated troponin 03/07/2024   Hyperkalemia 03/07/2024   Hyponatremia 03/07/2024   Secondary hypercoagulability disorder (HCC) 03/07/2024   Acute metabolic encephalopathy 03/07/2024   Cellulitis 03/07/2024   Failure to thrive in adult 03/07/2024   Dementia (HCC)  03/07/2024   Diabetic foot ulcer (HCC) 06/12/2023   Ulcer of left heel (HCC) 06/04/2023   Atrial fibrillation (HCC)    History of coronary artery stent placement 06/28/2020   PAD (peripheral artery disease) (HCC) 06/28/2020   Status post left partial knee replacement 02/09/2020   Anemia of chronic disease 09/01/2019   CKD (chronic kidney disease) stage 3, GFR 30-59 ml/min (HCC) 09/01/2019   Status post revision of total knee replacement, right 05/05/2019   Diabetic retinopathy associated with type 1 diabetes mellitus (HCC) 04/20/2019   History of coronary artery disease 10/06/2018   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) 07/28/2018   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, with long-term current use of insulin  (HCC) 07/28/2018   Essential hypertension 07/28/2018   OSA (obstructive sleep apnea) 07/28/2018   GERD (gastroesophageal reflux disease) 07/28/2018   Chronic non-seasonal allergic rhinitis 07/28/2018   Chronic constipation 07/28/2018   Primary osteoarthritis of right knee 07/28/2018   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) 07/28/2018   Status post total knee replacement using cement, right 07/15/2018   Status post revision of total replacement of right knee 07/15/2018   Primary osteoarthritis of left knee 05/23/2017   Medicare annual wellness visit, subsequent 03/05/2017   History of chronic health problem 03/13/2016   Vaccine counseling 03/13/2016   History of amputation of lesser toe of left foot (HCC) 10/20/2015   History of malignant melanoma of skin 12/24/2014   H/O adenomatous polyp of colon 08/18/2014   History of DVT (deep vein thrombosis) 05/05/2014   Type 1 diabetes mellitus (HCC) 03/23/2014   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) 03/23/2014   PCP:  Alla Amis, MD Pharmacy:   [id number] [address] [place], [place] - [address] AT [facility] [address]  [address] [place] [address] [place] [address] Phone: [phone] Fax: [phone]     Social Drivers of Health (SDOH) Social History: SDOH Screenings   Food Insecurity: No Food Insecurity (03/07/2024)  Housing: Low Risk  (03/07/2024)  Transportation Needs: No Transportation Needs (03/07/2024)  Utilities: Not At Risk (03/07/2024)  Depression (PHQ2-9): Low Risk  (08/18/2019)  Financial Resource Strain: Low Risk  (05/07/2023)   Received from [facility]  Social Connections: Moderately Isolated (03/07/2024)  Tobacco Use: Low Risk  (03/07/2024)   SDOH Interventions:     Readmission Risk Interventions     No data to display

## 2024-03-10 DIAGNOSIS — R001 Bradycardia, unspecified: Secondary | ICD-10-CM | POA: Diagnosis not present

## 2024-03-10 DIAGNOSIS — G9341 Metabolic encephalopathy: Secondary | ICD-10-CM | POA: Diagnosis not present

## 2024-03-10 DIAGNOSIS — E875 Hyperkalemia: Secondary | ICD-10-CM | POA: Diagnosis not present

## 2024-03-10 DIAGNOSIS — N179 Acute kidney failure, unspecified: Secondary | ICD-10-CM | POA: Diagnosis not present

## 2024-03-10 LAB — MAGNESIUM: Magnesium: 1.8 mg/dL (ref 1.7–2.4)

## 2024-03-10 LAB — BASIC METABOLIC PANEL WITH GFR
Anion gap: 6 (ref 5–15)
BUN: 23 mg/dL (ref 8–23)
CO2: 27 mmol/L (ref 22–32)
Calcium: 8.7 mg/dL — ABNORMAL LOW (ref 8.9–10.3)
Chloride: 106 mmol/L (ref 98–111)
Creatinine, Ser: 1.26 mg/dL — ABNORMAL HIGH (ref 0.61–1.24)
GFR, Estimated: 57 mL/min — ABNORMAL LOW (ref >60)
Glucose, Bld: 172 mg/dL — ABNORMAL HIGH (ref 70–99)
Potassium: 4 mmol/L (ref 3.5–5.1)
Sodium: 139 mmol/L (ref 135–145)

## 2024-03-10 LAB — T.PALLIDUM AB, TOTAL: T Pallidum Abs: NONREACTIVE

## 2024-03-10 LAB — GLUCOSE, CAPILLARY
Glucose-Capillary: 150 mg/dL — ABNORMAL HIGH (ref 70–99)
Glucose-Capillary: 174 mg/dL — ABNORMAL HIGH (ref 70–99)
Glucose-Capillary: 206 mg/dL — ABNORMAL HIGH (ref 70–99)
Glucose-Capillary: 286 mg/dL — ABNORMAL HIGH (ref 70–99)
Glucose-Capillary: 280 mg/dL — ABNORMAL HIGH (ref 70–99)

## 2024-03-10 MED ORDER — RAMIPRIL 5 MG PO CAPS
5.0000 mg | ORAL_CAPSULE | Freq: Every day | ORAL | Status: DC
  Administered 2024-03-11: 5 mg via ORAL
  Filled 2024-03-10: qty 1

## 2024-03-10 MED ORDER — MAGNESIUM SULFATE 2 GM/50ML IV SOLN
2.0000 g | Freq: Once | INTRAVENOUS | Status: AC
  Administered 2024-03-10: 2 g via INTRAVENOUS
  Filled 2024-03-10: qty 50

## 2024-03-10 MED ORDER — INSULIN ASPART 100 UNIT/ML IJ SOLN
3.0000 [IU] | Freq: Three times a day (TID) | INTRAMUSCULAR | Status: DC
  Administered 2024-03-10 – 2024-03-11 (×4): 3 [IU] via SUBCUTANEOUS
  Filled 2024-03-10 (×4): qty 1

## 2024-03-10 MED ORDER — RAMIPRIL 1.25 MG PO CAPS
2.5000 mg | ORAL_CAPSULE | Freq: Once | ORAL | Status: AC
  Administered 2024-03-10: 2.5 mg via ORAL
  Filled 2024-03-10: qty 2

## 2024-03-10 NOTE — Progress Notes (Signed)
 Occupational Therapy Treatment Patient Details Name: Carl Yang MRN: [id number] DOB: [birth year] Today's Date: 03/10/2024   History of present illness Pt is an 83 y/o M admitted on 03/08/24 after presenting with c/o increasing confusion. Pt is being treated for bradycardia, hyperosmolar hyperglycemic state. Pt also has BLE cellulitis, followed by podiatry. PMH: mixed dementia, DM1, DVT, a-fib, CAD s/p PCI, HTN, CKD 3   OT comments  Pt seen for OT tx. Pt denies complaints, agreeable to OOB but declines to leave the room citing desire to continue to watch a favorite tv show. Pt completed bed mobility with mod indep, requiring CGA-MIN A for STS from std height bed with RW 2/2 height. Pt required CGA for mobility with RW, much improved from previous date when using hurry cane. Pt politely declined further ADL/mobility. Set up in recliner. All needs in reach. Pt progressing well, continues to benefit.       If plan is discharge home, recommend the following:  A little help with walking and/or transfers;A little help with bathing/dressing/bathroom;Assistance with cooking/housework;Assist for transportation;Direct supervision/assist for medications management;Direct supervision/assist for financial management;Supervision due to cognitive status;Help with stairs or ramp for entrance   Equipment Recommendations  Other (comment) (2WW)    Recommendations for Other Services      Precautions / Restrictions Precautions Precautions: Fall Recall of Precautions/Restrictions: Impaired Restrictions Weight Bearing Restrictions Per Provider Order: No       Mobility Bed Mobility Overal bed mobility: Needs Assistance Bed Mobility: Supine to Sit     Supine to sit: Modified independent (Device/Increase time), HOB elevated, Used rails     General bed mobility comments: VC to initiate    Transfers Overall transfer level: Needs assistance Equipment used: Rolling walker (2 wheels) Transfers: Sit  to/from Stand Sit to Stand: Min assist, Contact guard assist           General transfer comment: from std height bed, pt required anterior/post momentum to complete with light assist with RW     Balance Overall balance assessment: Needs assistance Sitting-balance support: Feet supported Sitting balance-Leahy Scale: Good     Standing balance support: Bilateral upper extremity supported, Reliant on assistive device for balance Standing balance-Leahy Scale: Fair                             ADL either performed or assessed with clinical judgement    Communication Communication Communication: No apparent difficulties   Cognition Arousal: Alert Behavior During Therapy: WFL for tasks assessed/performed Cognition: History of cognitive impairments             OT - Cognition Comments: Pt alert and pleasant, agreeable          Following commands: Intact        Cueing   Cueing Techniques: Verbal cues        General Comments HR in 40's    Pertinent Vitals/ Pain       Pain Assessment Pain Assessment: No/denies pain   Frequency  Min 2X/week        Progress Toward Goals  OT Goals(current goals can now be found in the care plan section)  Progress towards OT goals: Progressing toward goals  Acute Rehab OT Goals Patient Stated Goal: get better OT Goal Formulation: With patient/family Time For Goal Achievement: 03/23/24 Potential to Achieve Goals: Good      AM-PAC OT 6 Clicks Daily Activity     Outcome Measure   Help  from another person eating meals?: None Help from another person taking care of personal grooming?: A Little Help from another person toileting, which includes using toliet, bedpan, or urinal?: A Little Help from another person bathing (including washing, rinsing, drying)?: A Little Help from another person to put on and taking off regular upper body clothing?: A Little Help from another person to put on and taking off regular  lower body clothing?: A Little 6 Click Score: 19    End of Session Equipment Utilized During Treatment: Rolling walker (2 wheels)  OT Visit Diagnosis: Other abnormalities of gait and mobility (R26.89);Muscle weakness (generalized) (M62.81);Other symptoms and signs involving cognitive function   Activity Tolerance Patient tolerated treatment well   Patient Left in chair;with call bell/phone within reach;with chair alarm set   Nurse Communication          Time: 1136-1150 OT Time Calculation (min): 14 min  Charges: OT General Charges $OT Visit: 1 Visit OT Treatments $Therapeutic Activity: 8-22 mins  Warren SAUNDERS., MPH, MS, OTR/L ascom [phone] 03/10/24, 12:58 PM

## 2024-03-10 NOTE — Progress Notes (Signed)
 PROGRESS NOTE    Carl Yang  FMW:[id number] DOB: April 30, 1941 DOA: 03/07/2024 PCP: Alla Amis, MD    Brief Narrative:  83 year old gentleman with a past medical history of mixed dementia, type 1 diabetes mellitus, deep venous thrombosis, atrial fibrillation coronary artery disease status post PCI, hypertension, chronic kidney disease stage III who presented from his independent living facility due to increasing confusion, the patient's daughter [name] at the bedside assists with history. The patient insulin  pump was and recently has been on insulin  pen however the compliance is likely dubious. The patient denied any fever or chills denies any open wounds including his legs and reports his right lower extremity edema is slightly larger than baseline and was unsure regarding the new erythema.   6/23: Intermittent bradycardia and A-fib.  Hemodynamically stable.  PT is recommending SNF.  Improving renal function with creatinine at 1.26 today.  6/24: Remained borderline bradycardic.  Renal function stable.  TOC to work on placement.   Assessment and Plan: * Bradycardia/transient complete heart block Paroxysmal atrial fibrillation. 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 - Continue with Eliquis   Hyperosmolar hyperglycemic state (HHS) (HCC) Uncontrolled with hyperglycemia and A1c of 13.1. Apparently forgetting to take his home meds and insulin  - Continue with basal and SSI  AKI (acute kidney injury) (HCC) -Creatinine trending down  - Monitor renal function  Hyperkalemia -Resolved  Failure to thrive in adult/dementia Patient's daughter reports he is unable to  care for himself as independent living have consulted transition of care for transition to either ALF or skilled nursing portion - PT is recommending SNF - Family is planning transitioning to assisted living after rehab  Cellulitis Mild if any of the right lower extremity  -no leukocytosis with WBC 5.7 afebrile  -on cefazolin  - Discontinuing antibiotics now.  Secondary hypercoagulability disorder (HCC) Due to atrial fibrillation by history also history of DVT -continue with Eliquis  2.5 mg twice a day  Hyponatremia Pseudohyponatremia in the setting of hyperglycemia - Resolved  Elevated troponin Troponin 38, 39 likely secondary to the AKI on the CKD no ischemic chest pain although history of coronary artery disease status post stenting  -echo-EF 55 to 60%  GERD (gastroesophageal reflux disease) Continue his proton pump inhibitor   OSA (obstructive sleep apnea) CPAP at night  Essential hypertension Blood pressure started trending up -Home ramipril  was restarted   DVT prophylaxis: apixaban  (ELIQUIS ) tablet 2.5 mg Start: 03/07/24 2200 apixaban  (ELIQUIS ) tablet 2.5 mg    Code Status: Limited: Do not attempt resuscitation (DNR) -DNR-LIMITED -Do Not Intubate/DNI  Family Communication: Discussed with daughter at bedside  Disposition Plan:  Level of care: Telemetry Cardiac Status is: Inpatient    Consultants:  Cardiology   Subjective: Patient was sitting comfortably in chair when seen today.  No new concern.  Objective: Vitals:   03/10/24 0556 03/10/24 0716 03/10/24 1128 03/10/24 1451  BP: (!) 166/66 (!) 152/63 (!) 171/67 (!) 148/81  Pulse:  (!) 38 (!) 40 (!) 44  Resp:  20 20 18   Temp:  98.3 F (36.8 C) 97.6 F (36.4 C) 98 F (36.7 C)  TempSrc:   Oral   SpO2:  100% 99% 100%  Weight:      Height:  Intake/Output Summary (Last 24 hours) at 03/10/2024 1606 Last data filed at 03/10/2024 0900 Gross per 24 hour  Intake 720 ml  Output 3170 ml  Net -2450 ml    Filed Weights   03/07/24 2010 03/07/24 2050 03/08/24 0725  Weight: 88.5 kg 91.2 kg 93 kg    Examination: General.  Frail elderly man, in no acute distress. Pulmonary.  Lungs clear bilaterally, normal respiratory effort. CV.  Regular rate and rhythm, no JVD, rub or murmur. Abdomen.  Soft, nontender, nondistended, BS positive. CNS.  Alert and oriented .  No focal neurologic deficit. Extremities.  No edema,  pulses intact and symmetrical..   Data Reviewed: I have personally reviewed following labs and imaging studies  CBC: Recent Labs  Lab 03/07/24 1511 03/08/24 0446 03/09/24 0422  WBC 5.7 7.3 6.3  HGB 11.0* 10.4* 11.3*  HCT 33.0* 31.6* 34.0*  MCV 92.2 92.4 93.2  PLT 156 145* 143*   Basic Metabolic Panel: Recent Labs  Lab 03/07/24 1511 03/07/24 1954 03/07/24 2300 03/08/24 0315 03/08/24 0446 03/09/24 0422 03/10/24 0449  NA 126*   < > 135 136 135 136 139  K 6.2*   < > 3.8 3.8 3.9 4.5 4.0  CL 95*   < > 102 102 104 105 106  CO2 22   < > 24 26 25 25 27   GLUCOSE 603*   < > 386* 164* 137* 212* 172*  BUN 38*   < > 35* 32* 30* 24* 23  CREATININE 1.62*   < > 1.54* 1.38* 1.32* 1.26* 1.26*  CALCIUM  8.4*   < > 8.5* 8.8* 8.6* 8.6* 8.7*  MG 1.9  --   --   --   --   --  1.8   < > = values in this interval not displayed.   GFR: Estimated Creatinine Clearance: 53.5 mL/min (A) (by C-G formula based on SCr of 1.26 mg/dL (H)). Liver Function Tests: Recent Labs  Lab 03/07/24 1954 03/08/24 0446  AST 30 24  ALT 27 20  ALKPHOS 62 50  BILITOT 0.9 0.7  PROT 5.8* 5.1*  ALBUMIN 3.4* 2.9*   No results for input(s): LIPASE, AMYLASE in the last 168 hours. Recent Labs  Lab 03/07/24 2300  AMMONIA <13   Coagulation Profile: No results for input(s): INR, PROTIME in the last 168 hours. Cardiac Enzymes: Recent Labs  Lab 03/07/24 1954  CKTOTAL 177   BNP (last 3 results) No results for input(s): PROBNP in the last 8760 hours. HbA1C: Recent Labs    03/07/24 1954   HGBA1C 13.1*   CBG: Recent Labs  Lab 03/09/24 2305 03/10/24 0317 03/10/24 0709 03/10/24 1044 03/10/24 1438  GLUCAP 152* 150* 174* 206* 286*   Lipid Profile: No results for input(s): CHOL, HDL, LDLCALC, TRIG, CHOLHDL, LDLDIRECT in the last 72 hours. Thyroid Function Tests: Recent Labs    03/07/24 1954  TSH 2.306   Anemia Panel: Recent Labs    03/07/24 1954  VITAMINB12 747   Sepsis Labs: No results for input(s): PROCALCITON, LATICACIDVEN in the last 168 hours.  Recent Results (from the past 240 hours)  MRSA Next Gen by PCR, Nasal     Status: None   Collection Time: 03/07/24  9:11 PM   Specimen: Nasal Mucosa; Nasal Swab  Result Value Ref Range Status   MRSA by PCR Next Gen NOT DETECTED NOT DETECTED Final    Comment: (NOTE) The GeneXpert MRSA Assay (FDA approved for NASAL specimens only), is one component of a comprehensive MRSA  colonization surveillance program. It is not intended to diagnose MRSA infection nor to guide or monitor treatment for MRSA infections. Test performance is not FDA approved in patients less than 65 years old. Performed at [facility], [address], [place], [place] [address]      Radiology Studies: No results found.  Scheduled Meds:  apixaban   2.5 mg Oral BID   Chlorhexidine  Gluconate Cloth  6 each Topical QHS   insulin  aspart  0-15 Units Subcutaneous TID WC   insulin  aspart  0-5 Units Subcutaneous QHS   insulin  aspart  3 Units Subcutaneous TID WC   insulin  glargine-yfgn  30 Units Subcutaneous Daily   loratadine   10 mg Oral Daily   pantoprazole   40 mg Oral Daily   [START ON 03/11/2024] ramipril   5 mg Oral Daily   rosuvastatin   40 mg Oral Daily   Continuous Infusions:   ceFAZolin  (ANCEF ) IV 2 g (03/10/24 1429)     LOS: 3 days    Time spent: 45 minutes spent on chart review, discussion with nursing staff, consultants, updating family and interview/physical exam; more than 50% of that time was spent in  counseling and/or coordination of care.  This record has been created using Conservation officer, historic buildings. Errors have been sought and corrected,but may not always be located. Such creation errors do not reflect on the standard of care.   Amaryllis Dare, MD [facility] Available via Epic secure chat 7am-7pm After these hours, please refer to coverage provider listed on [email or web address] 03/10/2024, 4:06 PM

## 2024-03-10 NOTE — Care Management Important Message (Signed)
 Important Message  Patient Details  Name: Carl Yang MRN: [id number] Date of Birth: Jan 30, 1941   Important Message Given:  Yes - Medicare IM     Carl Yang 03/10/2024, 12:12 PM

## 2024-03-10 NOTE — Progress Notes (Signed)
 [facility] CLINIC CARDIOLOGY PROGRESS NOTE       Patient ID: Carl Yang MRN: [id number] DOB/AGE: [birth year] 83 y.o.  Admit date: 03/07/2024 Referring Physician Dr. Prentice Core Primary Physician Alla Amis, MD Primary Cardiologist Dr. Bosie [phone]) Reason for Consultation bradycardia high grade AVB/ atrial fibrillation  HPI: Carl Yang is a 83 y.o. male  with a past medical history of coronary artery disease s/p stent (1999), paroxymal atrial fibrillation, hypertension, sleep apnea, type 1 diabetes mellitus, hx DVT, CKD stage 3, mixed dementia  who presented to the ED on 03/07/2024 for increasing confusion. Patient brought from independent living facility. Potassium found to be > 6. Cardiology was consulted for further evaluation of bradycardia high grade AVB and paroxymal atrial fibrillation  Interval History: -Patient seen and examined this AM and laying comfortably in hospital bed. Patient states he feels good this AM and denies chest pain, palpitations, SOB, lightheadedness or dizziness. -Per tele this AM rates in 40s with complete heart block. -Patients BP elevated and HR variable.  -Patient remains on room air with stable SpO2.     Review of systems complete and found to be negative unless listed above    Past Medical History:  Diagnosis Date   Arthritis    Atrial fibrillation (HCC)    Cancer (HCC)    Melanoma   Chronic anticoagulation    Warfarin   Complication of anesthesia    After surgery at [facility], the patient coded during the night.   Coronary artery disease    Stents   Diabetes mellitus without complication (HCC)    DVT (deep venous thrombosis) (HCC)    Right lower limb   Dyspnea    GERD (gastroesophageal reflux disease)    Insulin  pump in place    Myocardial infarction (HCC)    1998, 2003   Neuromuscular disorder (HCC)    Peripheral Neuropathy r/t DM   Peripheral vascular disease (HCC)    Diabetes   Sleep apnea    Uses CPAP    Past  Surgical History:  Procedure Laterality Date   AMPUTATION TOE Left    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   AMPUTATION TOE Left 10/09/2018   Procedure: TOE IPJ 2ND TOE LEFT;  Surgeon: Lilli Cough, DPM;  Location: [facility];  Service: Podiatry;  Laterality: Left;  IVA LOCAL Diabeticv - insulin  pump   BACK SURGERY     L4 & L5 Fusion   BONE EXCISION Left 07/08/2020   Procedure: EXCISION BONE METATARSAL LEFT;  Surgeon: Ashley Soulier, DPM;  Location: [facility];  Service: Podiatry;  Laterality: Left;   CARDIAC CATHETERIZATION  2003   CORONARY ANGIOPLASTY WITH STENT PLACEMENT     stents x 2   DIAGNOSTIC LAPAROSCOPY     EYE SURGERY Bilateral    cataracts   INCISION AND DRAINAGE OF WOUND Left 06/07/2023   Procedure: IRRIGATION AND DEBRIDEMENT BONE OF LEFT HEEL;  Surgeon: Neill Boas, DPM;  Location: [facility];  Service: Orthopedics/Podiatry;  Laterality: Left;   LOWER EXTREMITY ANGIOGRAPHY Left 06/06/2023   Procedure: Lower Extremity Angiography;  Surgeon: Jama Cordella MATSU, MD;  Location: [facility] INVASIVE CV LAB;  Service: Cardiovascular;  Laterality: Left;   PARTIAL KNEE ARTHROPLASTY Left 02/09/2020   Procedure: UNICOMPARTMENTAL KNEE;  Surgeon: Edie Norleen PARAS, MD;  Location: [facility];  Service: Orthopedics;  Laterality: Left;   TONSILLECTOMY     TOTAL KNEE ARTHROPLASTY Right 07/15/2018   Procedure: TOTAL KNEE ARTHROPLASTY;  Surgeon: Edie Norleen PARAS, MD;  Location: [facility] OR[facility];  Service: Orthopedics;  Laterality: Right;   TOTAL KNEE REVISION Right 05/05/2019   Procedure: TOTAL KNEE REVISION;  Surgeon: Edie Norleen PARAS, MD;  Location: [facility];  Service: Orthopedics;  Laterality: Right;    Medications Prior to Admission  Medication Sig Dispense Refill Last Dose/Taking   Besifloxacin HCl (BESIVANCE) 0.6 % SUSP Place 1 drop into the left eye 4 (four) times daily. QID x 2 days after each monthly eye injection   Taking   collagenase (SANTYL) 250 UNIT/GM ointment Apply 1 Application topically  daily.   Taking   donepezil (ARICEPT) 5 MG tablet Take 5 mg by mouth at bedtime.   03/06/2024   fexofenadine (ALLEGRA) 180 MG tablet Take 180 mg by mouth daily as needed for allergies.  1 Past Month   insulin  aspart (NOVOLOG ) 100 UNIT/ML injection Inject 1-5 Units into the skin 3 (three) times daily with meals. Use sliding scale   Taking   insulin  glargine (LANTUS ) 100 UNIT/ML injection Inject 35 Units into the skin at bedtime.   Taking   metFORMIN  (GLUCOPHAGE ) 500 MG tablet Take 500 mg by mouth 2 (two) times daily with a meal.   03/07/2024   Multiple Vitamin (MULTIVITAMIN WITH MINERALS) TABS tablet Take 1 tablet by mouth daily.   03/07/2024   ramipril  (ALTACE ) 2.5 MG capsule Take 2.5 mg by mouth daily.   03/07/2024   silver sulfADIAZINE (SILVADENE) 1 % cream Apply 1 Application topically daily.   03/07/2024   ELIQUIS  2.5 MG TABS tablet Take 2.5 mg by mouth 2 (two) times daily.   03/07/2024   esomeprazole  (NEXIUM ) 40 MG capsule Take 1 capsule (40 mg total) by mouth daily. (Patient taking differently: Take 40 mg by mouth every morning.) 30 capsule 0 03/07/2024   rosuvastatin  (CRESTOR ) 40 MG tablet Take 40 mg by mouth daily.   03/07/2024   Social History   Socioeconomic History   Marital status: Widowed    Spouse name: Not on file   Number of children: Not on file   Years of education: Not on file   Highest education level: Not on file  Occupational History   Not on file  Tobacco Use   Smoking status: Never   Smokeless tobacco: Never  Vaping Use   Vaping status: Never Used  Substance and Sexual Activity   Alcohol use: Yes    Alcohol/week: 1.0 standard drink of alcohol    Types: 1 Glasses of wine per week    Comment: Socially   Drug use: Never   Sexual activity: Not Currently  Other Topics Concern   Not on file  Social History Narrative   Not on file   Social Drivers of Health   Financial Resource Strain: Low Risk  (05/07/2023)   Received from [facility]   Overall  Financial Resource Strain (CARDIA)    Difficulty of Paying Living Expenses: Not hard at all  Food Insecurity: No Food Insecurity (03/07/2024)   Hunger Vital Sign    Worried About Running Out of Food in the Last Year: Never true    Ran Out of Food in the Last Year: Never true  Transportation Needs: No Transportation Needs (03/07/2024)   PRAPARE - Administrator, Civil Service (Medical): No    Lack of Transportation (Non-Medical): No  Physical Activity: Not on file  Stress: Not on file  Social Connections: Moderately Isolated (03/07/2024)   Social Connection and Isolation Panel    Frequency of Communication with Friends  and Family: More than three times a week    Frequency of Social Gatherings with Friends and Family: Patient unable to answer    Attends Religious Services: 1 to 4 times per year    Active Member of [employer] or Organizations: No    Attends Banker Meetings: Patient unable to answer    Marital Status: Widowed  Intimate Partner Violence: Not At Risk (03/07/2024)   Humiliation, Afraid, Rape, and Kick questionnaire    Fear of Current or Ex-Partner: No    Emotionally Abused: No    Physically Abused: No    Sexually Abused: No    History reviewed. No pertinent family history.   Vitals:   03/09/24 2303 03/10/24 0321 03/10/24 0556 03/10/24 0716  BP: 137/78 (!) 186/51 (!) 166/66 (!) 152/63  Pulse: (!) 42 99  (!) 38  Resp: 18 18  20   Temp: 97.7 F (36.5 C) (!) 97.1 F (36.2 C)  98.3 F (36.8 C)  TempSrc:      SpO2: 98% 100%  100%  Weight:      Height:        PHYSICAL EXAM General: well appearing elderly male, well nourished, in no acute distress. HEENT: Normocephalic and atraumatic. Neck: No JVD.   Lungs: Normal respiratory effort on room air. Clear bilaterally to auscultation. No wheezes, crackles, rhonchi.  Heart: Irregularly, irregular, variable rate. Normal S1 and S2 without gallops or murmurs.  Abdomen: Non-distended appearing.  Msk: Normal  strength and tone for age. Extremities: Warm and well perfused. No clubbing, cyanosis, edema. Right foot ulcer.  Neuro: Alert and oriented X 3. Psych: Answers questions appropriately.   Labs: Basic Metabolic Panel: Recent Labs    03/07/24 1511 03/07/24 1954 03/09/24 0422 03/10/24 0449  NA 126*   < > 136 139  K 6.2*   < > 4.5 4.0  CL 95*   < > 105 106  CO2 22   < > 25 27  GLUCOSE 603*   < > 212* 172*  BUN 38*   < > 24* 23  CREATININE 1.62*   < > 1.26* 1.26*  CALCIUM  8.4*   < > 8.6* 8.7*  MG 1.9  --   --  1.8   < > = values in this interval not displayed.   Liver Function Tests: Recent Labs    03/07/24 1954 03/08/24 0446  AST 30 24  ALT 27 20  ALKPHOS 62 50  BILITOT 0.9 0.7  PROT 5.8* 5.1*  ALBUMIN 3.4* 2.9*   No results for input(s): LIPASE, AMYLASE in the last 72 hours. CBC: Recent Labs    03/08/24 0446 03/09/24 0422  WBC 7.3 6.3  HGB 10.4* 11.3*  HCT 31.6* 34.0*  MCV 92.4 93.2  PLT 145* 143*   Cardiac Enzymes: Recent Labs    03/07/24 1511 03/07/24 1740 03/07/24 1954  CKTOTAL  --   --  177  TROPONINIHS 38* 39*  --    BNP: No results for input(s): BNP in the last 72 hours. D-Dimer: No results for input(s): DDIMER in the last 72 hours. Hemoglobin A1C: Recent Labs    03/07/24 1954  HGBA1C 13.1*   Fasting Lipid Panel: No results for input(s): CHOL, HDL, LDLCALC, TRIG, CHOLHDL, LDLDIRECT in the last 72 hours. Thyroid Function Tests: Recent Labs    03/07/24 1954  TSH 2.306   Anemia Panel: Recent Labs    03/07/24 1954  VITAMINB12 747     Radiology: ECHOCARDIOGRAM COMPLETE Result Date: 03/08/2024  ECHOCARDIOGRAM REPORT   Patient Name:   Carl Yang Date of Exam: 03/08/2024 Medical Rec #:  [id number]          Height:       74.0 in Accession #:    [id number]         Weight:       205.0 lb Date of Birth:  1940-10-03          BSA:          2.197 m Patient Age:    83 years           BP:           117/55 mmHg Patient  Gender: M                  HR:           41 bpm. Exam Location:  [facility] Procedure: 2D Echo, Cardiac Doppler, Color Doppler and Strain Analysis (Both            Spectral and Color Flow Doppler were utilized during procedure). Indications:     Abnormal ECG R94.31  History:         Patient has no prior history of Echocardiogram examinations.  Sonographer:     Thedora Louder RDCS, FASE Referring Phys:  [id number] PRENTICE BROCKS CORE Diagnosing Phys: Cara JONETTA Lovelace MD  Sonographer Comments: Global longitudinal strain was attempted. IMPRESSIONS  1. Left ventricular ejection fraction, by estimation, is 55 to 60%. The left ventricle has normal function. The left ventricle has no regional wall motion abnormalities. Left ventricular diastolic parameters were normal. The average left ventricular global longitudinal strain is 18.3 %. The global longitudinal strain is normal.  2. Right ventricular systolic function is normal. The right ventricular size is normal.  3. The mitral valve is normal in structure. Trivial mitral valve regurgitation.  4. The aortic valve is normal in structure. Aortic valve regurgitation is not visualized. FINDINGS  Left Ventricle: Left ventricular ejection fraction, by estimation, is 55 to 60%. The left ventricle has normal function. The left ventricle has no regional wall motion abnormalities. The average left ventricular global longitudinal strain is 18.3 %. Strain was performed and the global longitudinal strain is normal. The left ventricular internal cavity size was normal in size. There is no left ventricular hypertrophy. Left ventricular diastolic parameters were normal. Right Ventricle: The right ventricular size is normal. No increase in right ventricular wall thickness. Right ventricular systolic function is normal. Left Atrium: Left atrial size was normal in size. Right Atrium: Right atrial size was normal in size. Pericardium: There is no evidence of pericardial effusion. Mitral Valve: The mitral  valve is normal in structure. Trivial mitral valve regurgitation. Tricuspid Valve: The tricuspid valve is normal in structure. Tricuspid valve regurgitation is mild. Aortic Valve: The aortic valve is normal in structure. Aortic valve regurgitation is not visualized. Aortic valve peak gradient measures 7.1 mmHg. Pulmonic Valve: The pulmonic valve was normal in structure. Pulmonic valve regurgitation is not visualized. Aorta: The ascending aorta was not well visualized. IAS/Shunts: No atrial level shunt detected by color flow Doppler. Additional Comments: 3D was performed not requiring image post processing on an independent workstation and was normal.  LEFT VENTRICLE PLAX 2D LVIDd:         4.80 cm   Diastology LVIDs:         3.40 cm   LV e' medial:    7.40 cm/s LV PW:  1.00 cm   LV E/e' medial:  9.2 LV IVS:        1.00 cm   LV e' lateral:   10.40 cm/s LVOT diam:     2.20 cm   LV E/e' lateral: 6.5 LV SV:         91 LV SV Index:   41        2D Longitudinal Strain LVOT Area:     3.80 cm  2D Strain GLS (A4C):   15.3 %                          2D Strain GLS (A3C):   20.5 %                          2D Strain GLS (A2C):   19.0 %                          2D Strain GLS Avg:     18.3 % RIGHT VENTRICLE RV Basal diam:  3.60 cm RV S prime:     15.60 cm/s TAPSE (M-mode): 2.4 cm LEFT ATRIUM           Index        RIGHT ATRIUM           Index LA diam:      4.10 cm 1.87 cm/m   RA Area:     26.50 cm LA Vol (A2C): 44.5 ml 20.26 ml/m  RA Volume:   99.10 ml  45.12 ml/m LA Vol (A4C): 35.2 ml 16.03 ml/m  AORTIC VALVE                 PULMONIC VALVE AV Area (Vmax): 3.00 cm     PV Vmax:        1.14 m/s AV Vmax:        133.00 cm/s  PV Peak grad:   5.2 mmHg AV Peak Grad:   7.1 mmHg     RVOT Peak grad: 3 mmHg LVOT Vmax:      105.00 cm/s LVOT Vmean:     75.000 cm/s LVOT VTI:       0.239 m  AORTA Ao Root diam: 3.50 cm Ao Asc diam:  3.10 cm MITRAL VALVE               TRICUSPID VALVE MV Area (PHT): 2.74 cm    TR Peak grad:   24.0 mmHg  MV Decel Time: 277 msec    TR Vmax:        245.00 cm/s MV E velocity: 68.10 cm/s MV A velocity: 52.90 cm/s  SHUNTS MV E/A ratio:  1.29        Systemic VTI:  0.24 m                            Systemic Diam: 2.20 cm Cara JONETTA Lovelace MD Electronically signed by Cara JONETTA Lovelace MD Signature Date/Time: 03/08/2024/9:44:31 AM    Final    US  RENAL Result Date: 03/07/2024 CLINICAL DATA:  Acute renal injury EXAM: RENAL / URINARY TRACT ULTRASOUND COMPLETE COMPARISON:  None Available. FINDINGS: Right Kidney: Renal measurements: 9.9 x 4.9 x 5.4 cm. = volume: 137 mL. Increased echogenicity is noted. No mass or hydronephrosis is seen. Left Kidney: Renal measurements: 9.5 x 4.8 x 4.8 cm. = volume: 114 mL. Increased  echogenicity is noted. No mass or hydronephrosis is noted. Bladder: Appears normal for degree of bladder distention. Other: None. IMPRESSION: Changes consistent with medical renal disease. No other focal abnormality is noted. Electronically Signed   By: Oneil Devonshire M.D.   On: 03/07/2024 22:13   US  Venous Img Lower Unilateral Right (DVT) Result Date: 03/07/2024 CLINICAL DATA:  Right lower extremity swelling EXAM: RIGHT LOWER EXTREMITY VENOUS DOPPLER ULTRASOUND TECHNIQUE: Gray-scale sonography with graded compression, as well as color Doppler and duplex ultrasound were performed to evaluate the lower extremity deep venous systems from the level of the common femoral vein and including the common femoral, femoral, profunda femoral, popliteal and calf veins including the posterior tibial, peroneal and gastrocnemius veins when visible. The superficial great saphenous vein was also interrogated. Spectral Doppler was utilized to evaluate flow at rest and with distal augmentation maneuvers in the common femoral, femoral and popliteal veins. COMPARISON:  None Available. FINDINGS: Contralateral Common Femoral Vein: Respiratory phasicity is normal and symmetric with the symptomatic side. No evidence of thrombus. Normal  compressibility. Common Femoral Vein: No evidence of thrombus. Normal compressibility, respiratory phasicity and response to augmentation. Saphenofemoral Junction: No evidence of thrombus. Normal compressibility and flow on color Doppler imaging. Profunda Femoral Vein: No evidence of thrombus. Normal compressibility and flow on color Doppler imaging. Femoral Vein: Thrombus is noted with decreased compressibility. Popliteal Vein: Thrombus is noted with decreased compressibility. Calf Veins: Not well visualized Superficial Great Saphenous Vein: No evidence of thrombus. Normal compressibility. Venous Reflux:  None. Other Findings:  None. 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 Electronically Signed   By: Oneil Devonshire M.D.   On: 03/07/2024 22:12   Portable chest x-ray (1 view) Result Date: 03/07/2024 CLINICAL DATA:  Bradycardia EXAM: PORTABLE CHEST 1 VIEW COMPARISON:  05/05/2019 FINDINGS: The lungs are symmetrically hyperinflated in keeping with changes of underlying COPD. 15 mm nodular opacity seen at the left costophrenic angle, indeterminate but new since prior examination. Lungs are otherwise clear. No pneumothorax or pleural effusion. Cardiac size is mildly enlarged, stable. Pulmonary vascularity is normal. Osseous structures are age appropriate. IMPRESSION: 1. No radiographic evidence of acute cardiopulmonary disease. 2. COPD. 3. 15 mm nodular opacity at the left costophrenic angle, indeterminate. This could be better assessed with nonemergent CT imaging if indicated. Electronically Signed   By: Dorethia Molt M.D.   On: 03/07/2024 20:19   CT HEAD WO CONTRAST ( ) Result Date: 03/07/2024 CLINICAL DATA:  Delirium EXAM: CT HEAD WITHOUT CONTRAST TECHNIQUE: Contiguous axial images were obtained from the base of the skull through the vertex without intravenous contrast. RADIATION DOSE REDUCTION: This exam was performed according to the departmental  dose-optimization program which includes automated exposure control, adjustment of the mA and/or kV according to patient size and/or use of iterative reconstruction technique. COMPARISON:  07/20/2008 FINDINGS: Brain: There is no mass, hemorrhage or extra-axial collection. The appearance of the white matter is normal for the patient's age. There is generalized atrophy. Vascular: Atherosclerotic calcification of the internal carotid arteries at the skull base. No abnormal hyperdensity of the major intracranial arteries or dural venous sinuses. Skull: The visualized skull base, calvarium and extracranial soft tissues are normal. Sinuses/Orbits: Bilateral maxillary sinus mucosal thickening. Small amount of right mastoid fluid. Normal orbits. Other: None. IMPRESSION: 1. No acute intracranial abnormality. 2. Bilateral maxillary sinus mucosal thickening. 3. Small amount of right mastoid fluid. Electronically Signed   By: Franky Stanford M.D.   On:  03/07/2024 20:16    ECHO as above  TELEMETRY reviewed by me 03/10/2024: complete heart block with rates 40s   EKG reviewed by me: third-degree AV block A-V dissociation, 39 bpm 03/07/24:  Atrial fibrillation, 90 bpm with nonspecific ST-T wave changes  Data reviewed by me 03/10/2024: last 24h vitals tele labs imaging I/O hospitalist progress notes.  Principal Problem:   Bradycardia Active Problems:   Essential hypertension   OSA (obstructive sleep apnea)   GERD (gastroesophageal reflux disease)   Hyperosmolar hyperglycemic state (HHS) (HCC)   AKI (acute kidney injury) (HCC)   Elevated troponin   Hyperkalemia   Hyponatremia   Secondary hypercoagulability disorder (HCC)   Acute metabolic encephalopathy   Cellulitis   Failure to thrive in adult   Dementia [facility])    ASSESSMENT AND PLAN:  Carl Yang is a 83 y.o. male  with a past medical history of coronary artery disease s/p stent (1999), paroxymal atrial fibrillation, hypertension, sleep apnea, type 1  diabetes mellitus, hx DVT, CKD stage 3, mixed dementia  who presented to the ED on 03/07/2024 for increasing confusion. Patient brought from independent living facility. Potassium found to be > 6. Cardiology was consulted for further evaluation of bradycardia high grade AVB and paroxymal atrial fibrillation  # Bradycardia with transient complete heart block # Paroxsymal atrial fibrillation  # Hyperkalemia, resolved EKG with evidence of third degree AVB, AV dissociation with rate 30s. EKG with atrial fibrillation rate 90s. Per tele this AM rates 40s, with complete heart block -Keep potassium less than 4. Monitor closely -Avoid all AVN blockers. -Continue Eliquis  2.5 mg twice daily for stroke risk reduction.  -Do not recommend temporary or permanent pacemaker at this stage because of comorbid disease. Would recommend conservative management as per family's request.   # Mixed Dementia # Acute metabolic encephalopathy  Patient presents with increasing confusion from independent living facility. Patient states he's feeling well and answers all questions appropriately this AM. -Management per primary team.  # Hypertension # Hyperlipidemia # Coronary artery disease s/p stent (1999) EKG without acute ischemic changes. Trops minimally elevated and flat 38 > 39. Echo this admission with pEF, no RWMA, no significant valvular dysfunction.  -Continue Crestor  40 mg daily.  -Increased home ramipril  to 5 mg daily.  -Minimally elevated and flat is most consistent with demand/supply mismatch and not ACS   This patient's plan of care was discussed and created with Dr. Florencio and he is in agreement.  Signed: Dorene Comfort, PA-C  03/10/2024, 9:12 AM [facility] Cardiology

## 2024-03-10 NOTE — Plan of Care (Signed)
   Problem: Metabolic: Goal: Ability to maintain appropriate glucose levels will improve Outcome: Progressing

## 2024-03-10 NOTE — TOC Progression Note (Signed)
 Transition of Care [facility]) - Progression Note    Patient Details  Name: Mattias Walmsley MRN: [id number] Date of Birth: [birth year]  [facility] CM/SW Contact  Tomasa JAYSON Childes, RN Phone Number: 03/10/2024, 3:30 PM  Clinical Narrative:    Spoke with patient at bedside. 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    Expected Discharge Plan: Skilled Nursing Facility Barriers to Discharge: Continued Medical Work up  Expected Discharge Plan and Services   Discharge Planning Services: CM Consult Post Acute Care Choice: Skilled Nursing Facility Living arrangements for the past 2 months: Independent Living Facility ([facility])                                       Social Determinants of Health (SDOH) Interventions SDOH Screenings   Food Insecurity: No Food Insecurity (03/07/2024)  Housing: Low Risk  (03/07/2024)  Transportation Needs: No Transportation Needs (03/07/2024)  Utilities: Not At Risk (03/07/2024)  Depression (PHQ2-9): Low Risk  (08/18/2019)  Financial Resource Strain: Low Risk  (05/07/2023)   Received from [facility]  Social Connections: Moderately Isolated (03/07/2024)  Tobacco Use: Low Risk  (03/07/2024)    Readmission Risk Interventions     No data to display

## 2024-03-10 NOTE — Inpatient Diabetes Management (Signed)
 Inpatient Diabetes Program Recommendations  AACE/ADA: New Consensus Statement on Inpatient Glycemic Control (2015)  Target Ranges:  Prepandial:   less than 140 mg/dL      Peak postprandial:   less than 180 mg/dL (1-2 hours)      Critically ill patients:  140 - 180 mg/dL    Latest Reference Range & Units 03/09/24 07:24 03/09/24 11:20 03/09/24 16:08 03/09/24 20:46  Glucose-Capillary 70 - 99 mg/dL 738 (H)  8 units Novolog   30 units Semglee  @0929   274 (H)  8 units Novolog   354 (H)  15 units Novolog   187 (H)  (H): Data is abnormally high   Admit from ILF with Increasing Confusion/ Bradycardia/ Hyperglycemia/ Hyperkalemia/ Failure to Thrive   History: Type 1 Diabetes, Dementia, CKD   Home DM Meds: Lantus  35 units Daily                             Novolog  0-17 units TID per SSI   Current Orders: Novolog  Moderate Correction Scale/ SSI (0-15 units) TID AC + HS                           Semglee  30 units daily      MD- please consider starting Novolog  3 units TID with meals HOLD if pt NPO HOLD if pt eats <50% meals     ENDO: Dr. Cherilyn Last Seen 01/23/2024 Pt was using the pump earlier this year, however, currently does not have the cognitive ability to manage the pump independently Was switched to injections Pt was told to take the following:  Lantus  35 units daily Novolog  SSI 0-140, take 0 units, if 140-154 take 1 unit, if 155-169, take 2 units, if 170-184, take 3 units, if 185-199, take 4 units, if 210-214, take 5 units, if 215-229, take 6 units, if 230-244, take 7 units, if 245-259, take 8 units, if 260-274, take 9, if 275-289, take 10 units, if 290-304, take 11 units, if 205-319, take 12 units, if 320-334, take 13 units, if 335-349, take 14 units, if 350-364, take 15 units, if 365-379, take 16 units, if 380 or above, take 17 units       --Will follow patient during hospitalization--  Adina Rudolpho Arrow RN, MSN, CDCES Diabetes Coordinator Inpatient  Glycemic Control Team Team Pager: [phone] (8a-5p)

## 2024-03-11 ENCOUNTER — Encounter: Payer: Self-pay | Admitting: Internal Medicine

## 2024-03-11 ENCOUNTER — Encounter (INDEPENDENT_AMBULATORY_CARE_PROVIDER_SITE_OTHER): Admitting: Ophthalmology

## 2024-03-11 DIAGNOSIS — I499 Cardiac arrhythmia, unspecified: Secondary | ICD-10-CM | POA: Diagnosis not present

## 2024-03-11 DIAGNOSIS — N179 Acute kidney failure, unspecified: Secondary | ICD-10-CM | POA: Diagnosis not present

## 2024-03-11 DIAGNOSIS — R739 Hyperglycemia, unspecified: Secondary | ICD-10-CM

## 2024-03-11 DIAGNOSIS — R7989 Other specified abnormal findings of blood chemistry: Secondary | ICD-10-CM

## 2024-03-11 DIAGNOSIS — E1100 Type 2 diabetes mellitus with hyperosmolarity without nonketotic hyperglycemic-hyperosmolar coma (NKHHC): Secondary | ICD-10-CM

## 2024-03-11 DIAGNOSIS — R001 Bradycardia, unspecified: Secondary | ICD-10-CM | POA: Diagnosis not present

## 2024-03-11 LAB — GLUCOSE, CAPILLARY
Glucose-Capillary: 176 mg/dL — ABNORMAL HIGH (ref 70–99)
Glucose-Capillary: 237 mg/dL — ABNORMAL HIGH (ref 70–99)

## 2024-03-11 MED ORDER — RAMIPRIL 5 MG PO CAPS: 5.0000 mg | ORAL_CAPSULE | Freq: Every day | ORAL | Status: AC

## 2024-03-11 NOTE — Progress Notes (Signed)
 Physical Therapy Treatment Patient Details Name: Carl Yang MRN: [id number] DOB: 09-14-1941 Today's Date: 03/11/2024   History of Present Illness Pt is an 83 y/o M admitted on 03/08/24 after presenting with c/o increasing confusion. Pt is being treated for bradycardia, hyperosmolar hyperglycemic state. Pt also has BLE cellulitis, followed by podiatry. PMH: mixed dementia, DM1, DVT, a-fib, CAD s/p PCI, HTN, CKD 3    PT Comments  Pt in bed on entry, agreeable to session, DTR at bedside who leaves immediately. Pt dons shoes prior to AMB. DIstance advanced to ~21ft with SPC, still veyr unsteady compared to baseline, but he has had minimal practice with gait last few days. HR bradycardic thereafter, pt denies any dizziness, SOB. Pt assisted with clean linen upgrades. Pt in recliner at end of session.    If plan is discharge home, recommend the following: A little help with walking and/or transfers;A little help with bathing/dressing/bathroom;Assistance with cooking/housework;Assist for transportation;Help with stairs or ramp for entrance;Supervision due to cognitive status;Direct supervision/assist for financial management   Can travel by private vehicle     Yes  Equipment Recommendations  None recommended by PT    Recommendations for Other Services       Precautions / Restrictions Precautions Precautions: Fall Restrictions Weight Bearing Restrictions Per Provider Order: No     Mobility  Bed Mobility Overal bed mobility: Needs Assistance Bed Mobility: Supine to Sit     Supine to sit: HOB elevated, Used rails, Supervision     General bed mobility comments: assist with tele/purewick tube    Transfers Overall transfer level: Needs assistance Equipment used: None Transfers: Sit to/from Stand Sit to Stand: From elevated surface           General transfer comment: I mean, he's pretty tired.    Ambulation/Gait Ambulation/Gait assistance: Min assist, +2  safety/equipment Gait Distance (Feet): 200 Feet Assistive device: Straight cane Gait Pattern/deviations: Step-through pattern, Staggering left, Staggering right       General Gait Details: several significant LOB, but seems to improve the farther he goes. PT dons diabetic shoes prior to AMB.   Stairs             Wheelchair Mobility     Tilt Bed    Modified Rankin (Stroke Patients Only)                Cueing    Exercises Other Exercises Other Exercises: STS from recliner x5 (+2 pillows) cues to push off chair, not RW    General Comments        Pertinent Vitals/Pain Pain Assessment Pain Assessment: No/denies pain    Home Living                          Prior Function            PT Goals (current goals can now be found in the care plan section) Acute Rehab PT Goals Patient Stated Goal: get better, return to [facility] PT Goal Formulation: With patient/family Time For Goal Achievement: 03/23/24 Potential to Achieve Goals: Good Progress towards PT goals: Progressing toward goals    Frequency    Min 2X/week      PT Plan      Co-evaluation              AM-PAC PT 6 Clicks Mobility   Outcome Measure  Help needed turning from your back to your side while in a flat bed without using bedrails?:  A Little Help needed moving from lying on your back to sitting on the side of a flat bed without using bedrails?: A Little Help needed moving to and from a bed to a chair (including a wheelchair)?: A Lot Help needed standing up from a chair using your arms (e.g., wheelchair or bedside chair)?: A Lot Help needed to walk in hospital room?: A Lot Help needed climbing 3-5 steps with a railing? : A Little 6 Click Score: 15    End of Session   Activity Tolerance: Patient tolerated treatment well;No increased pain Patient left: in chair;with call bell/phone within reach;with chair alarm set Nurse Communication: Mobility status PT Visit  Diagnosis: Unsteadiness on feet (R26.81);Muscle weakness (generalized) (M62.81)     Time: 8879-8858 PT Time Calculation (min) (ACUTE ONLY): 21 min  Charges:    $Therapeutic Activity: 8-22 mins PT General Charges $$ ACUTE PT VISIT: 1 Visit                    1:04 PM, 03/11/24 Peggye JAYSON Linear, PT, DPT Physical Therapist - [facility]  [phone] (ASCOM)     Alexsandria Kivett C 03/11/2024, 12:18 PM

## 2024-03-11 NOTE — Inpatient Diabetes Management (Signed)
 Inpatient Diabetes Program Recommendations  AACE/ADA: New Consensus Statement on Inpatient Glycemic Control (2015)  Target Ranges:  Prepandial:   less than 140 mg/dL      Peak postprandial:   less than 180 mg/dL (1-2 hours)      Critically ill patients:  140 - 180 mg/dL    Latest Reference Range & Units 03/10/24 07:09 03/10/24 10:44 03/10/24 14:38 03/10/24 22:07  Glucose-Capillary 70 - 99 mg/dL 825 (H)  3 units Novolog   30 units Semglee  @0912   206 (H)  6 units Novolog  @1259  286 (H)  11 units Novolog  @1634  280 (H)  3 units Novolog  @0000   (H): Data is abnormally high  Latest Reference Range & Units 03/11/24 08:00  Glucose-Capillary 70 - 99 mg/dL 823 (H)  (H): Data is abnormally high  History: Type 1 Diabetes, Dementia, CKD   Home DM Meds: Lantus  35 units Daily                             Novolog  0-17 units TID per SSI   Current Orders: Novolog  Moderate Correction Scale/ SSI (0-15 units) TID AC + HS                           Semglee  30 units daily      Novolog  3 units TID with meals   Note Novolog  meal coverage started yest at 12pm w/ Lunch   MD- Please consider:  1. Increase the Semglee  slightly to 32 units daily (AM CBGs >130)  2. Increase the Novolog  Meal Coverage to 5 units TID with meals     ENDO: Dr. Cherilyn Last Seen 01/23/2024 Pt was using the pump earlier this year, however, currently does not have the cognitive ability to manage the pump independently Was switched to injections Pt was told to take the following:  Lantus  35 units daily Novolog  SSI 0-140, take 0 units, if 140-154 take 1 unit, if 155-169, take 2 units, if 170-184, take 3 units, if 185-199, take 4 units, if 210-214, take 5 units, if 215-229, take 6 units, if 230-244, take 7 units, if 245-259, take 8 units, if 260-274, take 9, if 275-289, take 10 units, if 290-304, take 11 units, if 205-319, take 12 units, if 320-334, take 13 units, if 335-349, take 14 units, if 350-364, take 15 units,  if 365-379, take 16 units, if 380 or above, take 17 units      --Will follow patient during hospitalization--  Adina Rudolpho Arrow RN, MSN, CDCES Diabetes Coordinator Inpatient Glycemic Control Team Team Pager: [phone] (8a-5p)

## 2024-03-11 NOTE — TOC Progression Note (Addendum)
 Transition of Care [facility]) - Progression Note    Patient Details  Name: Coral Soler MRN: [id number] Date of Birth: Dec 11, 1940  Transition of Care [facility]) CM/SW Contact  Tomasa JAYSON Childes, RN Phone Number: 03/11/2024, 11:51 AM  Clinical Narrative:    Attempt to reach Luke in Admissions at [facility] to advised per MD patient is medically stable. No answer. Left a message.   Retrieved incoming call from [place] at [place]. Patient can admit today provided discharge summary is provided no later than 1:30 due to pharmacy closing. MD notified.     Expected Discharge Plan: Skilled Nursing Facility Barriers to Discharge: Continued Medical Work up  Expected Discharge Plan and Services   Discharge Planning Services: CM Consult Post Acute Care Choice: Skilled Nursing Facility Living arrangements for the past 2 months: Independent Living Facility ([facility])                                       Social Determinants of Health (SDOH) Interventions SDOH Screenings   Food Insecurity: No Food Insecurity (03/07/2024)  Housing: Low Risk  (03/07/2024)  Transportation Needs: No Transportation Needs (03/07/2024)  Utilities: Not At Risk (03/07/2024)  Depression (PHQ2-9): Low Risk  (08/18/2019)  Financial Resource Strain: Low Risk  (05/07/2023)   Received from [facility]  Social Connections: Moderately Isolated (03/07/2024)  Tobacco Use: Low Risk  (03/07/2024)    Readmission Risk Interventions     No data to display

## 2024-03-11 NOTE — TOC Transition Note (Signed)
 Transition of Care [facility]) - Discharge Note   Patient Details  Name: Carl Yang MRN: [id number] Date of Birth: [birth year]  [facility] CM/SW Contact:  Fynlee Rowlands C Keitra Carusone, RN Phone Number: 03/11/2024, 1:00 PM   Clinical Narrative:    Spoke with Luke Lapine in admissions. Per facility admissions coordinator patient admission confirmed for today. Patient assigned room # 353 Report will be called to [phone] Discharge summary sent in HUB.  Nurse, and patient's daughter advised of details for discharge Patient's daughter will transport patient at approximately 2:30pm TOC signing off.    Final next level of care: Skilled Nursing Facility Barriers to Discharge: Continued Medical Work up   Patient Goals and CMS Choice   CMS Medicare.gov Compare Post Acute Care list provided to:: Patient (Patient from [facility] and chooses to returnto higher level of care at [facility])        Discharge Placement                       Discharge Plan and Services Additional resources added to the After Visit Summary for     Discharge Planning Services: CM Consult Post Acute Care Choice: Skilled Nursing Facility                               Social Drivers of Health (SDOH) Interventions SDOH Screenings   Food Insecurity: No Food Insecurity (03/07/2024)  Housing: Low Risk  (03/07/2024)  Transportation Needs: No Transportation Needs (03/07/2024)  Utilities: Not At Risk (03/07/2024)  Depression (PHQ2-9): Low Risk  (08/18/2019)  Financial Resource Strain: Low Risk  (05/07/2023)   Received from [facility]  Social Connections: Moderately Isolated (03/07/2024)  Tobacco Use: Low Risk  (03/07/2024)     Readmission Risk Interventions     No data to display

## 2024-03-11 NOTE — Discharge Summary (Signed)
 Physician Discharge Summary   Patient: Carl Yang MRN: [id number] DOB: 29-Jan-1941  Admit date:     03/07/2024  Discharge date: 03/11/24  Discharge Physician: Amaryllis Dare   PCP: Alla Amis, MD   Recommendations at discharge:  Please obtain CBC and BMP and follow-up Patient with intermittent complete heart block-remained asymptomatic, need medical evaluation and EKG if becomes symptomatic with bradycardia.  Heart rate mostly in 30s to 40s.  Will follow-up closely with outpatient cardiology as does not want any pacemaker at this time. Follow-up with cardiology Follow-up with primary care provider  Discharge Diagnoses: Principal Problem:   Bradycardia Active Problems:   Hyperosmolar hyperglycemic state (HHS) (HCC)   AKI (acute kidney injury) (HCC)   Hyperkalemia   Essential hypertension   OSA (obstructive sleep apnea)   GERD (gastroesophageal reflux disease)   Elevated troponin   Hyponatremia   Secondary hypercoagulability disorder (HCC)   Acute metabolic encephalopathy   Cellulitis   Failure to thrive in adult   Dementia [facility])   Cardiac arrhythmia   Hyperglycemia  Resolved Problems:   * No resolved hospital problems. *  Hospital Course: 82 year old gentleman with a past medical history of mixed dementia, type 1 diabetes mellitus, deep venous thrombosis, atrial fibrillation coronary artery disease status post PCI, hypertension, chronic kidney disease stage III who presented from his independent living facility due to increasing confusion. Family is concerned of unable to take his medications regularly.  He was also recently started on Aricept for concern of developing dementia/cognitive impairment.  Patient denies any fever or chills, did had lower extremity edema, right more than left which is more than her baseline.  Denies any palpitations.  He denies any new erythema.  Did receive 3 days of antibiotic for concern of developing cellulitis which has been  completely resolved.  Patient found to have bradycardia with intermittent transient complete heart block.  Patient mostly remained asymptomatic.  Intermittent atrial fibrillation.  Cardiology was consulted.  Family would like to do conservative management and does not want any pacemaker at this time.  Patient remained asymptomatic.  Patient can discuss with cardiology if he changes his mind regarding pacemaker or becomes symptomatic.  Aricept was also discontinued as it can decrease heart rate.  He should avoid all nodal blocking agents.  He will continue with Eliquis .  Patient also was found to be in a hyperosmolar hyperglycemic state on admission, A1c of 13.1.  He was not sure whether he was taking his home insulin  appropriately.  Initially required insulin  infusion followed by basal and short-acting which she will continue on discharge and follow-up closely with PCP for further management.  On presentation patient does has AKI which has been improved.  Hyperkalemia has been resolved.  Patient was also found to have pseudohyponatremia secondary to hyperglycemia which resolved with hyperglycemia.  Found to have mildly elevated troponin, no chest pain.  Likely demand ischemia with AKI.  Patient also has an history of CAD s/p PCI.  Echocardiogram with normal EF.  Our physical therapist recommended going to rehab where he is being discharged.  Family is going to transition him to assisted living for better medication control and assistance.  Patient will continue with the rest of his home medications and follow-up with his providers for further assistance.    Consultants: Cardiology Procedures performed: None Disposition: Skilled nursing facility Diet recommendation:  Discharge Diet Orders (From admission, onward)     Start     Ordered   03/11/24 0000  Diet - low  sodium heart healthy        03/11/24 1234           Cardiac and Carb modified diet DISCHARGE MEDICATION: Allergies as of  03/11/2024   No Known Allergies      Medication List     STOP taking these medications    donepezil 5 MG tablet Commonly known as: ARICEPT       TAKE these medications    Besivance 0.6 % Susp Generic drug: Besifloxacin HCl Place 1 drop into the left eye 4 (four) times daily. QID x 2 days after each monthly eye injection   collagenase 250 UNIT/GM ointment Commonly known as: SANTYL Apply 1 Application topically daily.   Eliquis  2.5 MG Tabs tablet Generic drug: apixaban  Take 2.5 mg by mouth 2 (two) times daily.   esomeprazole  40 MG capsule Commonly known as: NEXIUM  Take 1 capsule (40 mg total) by mouth daily. What changed: when to take this   fexofenadine 180 MG tablet Commonly known as: ALLEGRA Take 180 mg by mouth daily as needed for allergies.   insulin  aspart 100 UNIT/ML injection Commonly known as: novoLOG  Inject 1-5 Units into the skin 3 (three) times daily with meals. Use sliding scale   insulin  glargine 100 UNIT/ML injection Commonly known as: LANTUS  Inject 35 Units into the skin at bedtime.   metFORMIN  500 MG tablet Commonly known as: GLUCOPHAGE  Take 500 mg by mouth 2 (two) times daily with a meal.   multivitamin with minerals Tabs tablet Take 1 tablet by mouth daily.   ramipril  5 MG capsule Commonly known as: ALTACE  Take 1 capsule (5 mg total) by mouth daily. Start taking on: March 12, 2024 What changed:  medication strength how much to take   rosuvastatin  40 MG tablet Commonly known as: CRESTOR  Take 40 mg by mouth daily.   silver sulfADIAZINE 1 % cream Commonly known as: SILVADENE Apply 1 Application topically daily.        Follow-up Information     Florencio Cara BIRCH, MD. Go in 1 week(s).   Specialties: Cardiology, Internal Medicine Contact information: [address] [place] [address] [phone]         Alla Amis, MD. Schedule an appointment as soon as possible for a visit in 1 week(s).   Specialty:  Family Medicine Contact information: [address] [facility] [place] [address] [phone]                Discharge Exam: Fredricka Weights   03/07/24 2010 03/07/24 2050 03/08/24 0725  Weight: 88.5 kg 91.2 kg 93 kg   General.  Frail elderly man, in no acute distress. Pulmonary.  Lungs clear bilaterally, normal respiratory effort. CV.  Regular rate and rhythm, no JVD, rub or murmur. Abdomen.  Soft, nontender, nondistended, BS positive. CNS.  Alert and oriented .  No focal neurologic deficit. Extremities.  No edema, pulses intact and symmetrical. Psychiatry.  Judgment and insight appears normal.   Condition at discharge: stable  The results of significant diagnostics from this hospitalization (including imaging, microbiology, ancillary and laboratory) are listed below for reference.   Imaging Studies: ECHOCARDIOGRAM COMPLETE Result Date: 03/08/2024    ECHOCARDIOGRAM REPORT   Patient Name:   DEMARIOUS KAPUR Gallop Date of Exam: 03/08/2024 Medical Rec #:  [id number]          Height:       74.0 in Accession #:    [id number]         Weight:  205.0 lb Date of Birth:  1941/02/12          BSA:          2.197 m Patient Age:    83 years           BP:           117/55 mmHg Patient Gender: M                  HR:           41 bpm. Exam Location:  [facility] Procedure: 2D Echo, Cardiac Doppler, Color Doppler and Strain Analysis (Both            Spectral and Color Flow Doppler were utilized during procedure). Indications:     Abnormal ECG R94.31  History:         Patient has no prior history of Echocardiogram examinations.  Sonographer:     Thedora Louder RDCS, FASE Referring Phys:  [id number] PRENTICE BROCKS CORE Diagnosing Phys: Cara JONETTA Lovelace MD  Sonographer Comments: Global longitudinal strain was attempted. IMPRESSIONS  1. Left ventricular ejection fraction, by estimation, is 55 to 60%. The left ventricle has normal function. The left ventricle has no regional wall motion  abnormalities. Left ventricular diastolic parameters were normal. The average left ventricular global longitudinal strain is 18.3 %. The global longitudinal strain is normal.  2. Right ventricular systolic function is normal. The right ventricular size is normal.  3. The mitral valve is normal in structure. Trivial mitral valve regurgitation.  4. The aortic valve is normal in structure. Aortic valve regurgitation is not visualized. FINDINGS  Left Ventricle: Left ventricular ejection fraction, by estimation, is 55 to 60%. The left ventricle has normal function. The left ventricle has no regional wall motion abnormalities. The average left ventricular global longitudinal strain is 18.3 %. Strain was performed and the global longitudinal strain is normal. The left ventricular internal cavity size was normal in size. There is no left ventricular hypertrophy. Left ventricular diastolic parameters were normal. Right Ventricle: The right ventricular size is normal. No increase in right ventricular wall thickness. Right ventricular systolic function is normal. Left Atrium: Left atrial size was normal in size. Right Atrium: Right atrial size was normal in size. Pericardium: There is no evidence of pericardial effusion. Mitral Valve: The mitral valve is normal in structure. Trivial mitral valve regurgitation. Tricuspid Valve: The tricuspid valve is normal in structure. Tricuspid valve regurgitation is mild. Aortic Valve: The aortic valve is normal in structure. Aortic valve regurgitation is not visualized. Aortic valve peak gradient measures 7.1 mmHg. Pulmonic Valve: The pulmonic valve was normal in structure. Pulmonic valve regurgitation is not visualized. Aorta: The ascending aorta was not well visualized. IAS/Shunts: No atrial level shunt detected by color flow Doppler. Additional Comments: 3D was performed not requiring image post processing on an independent workstation and was normal.  LEFT VENTRICLE PLAX 2D LVIDd:          4.80 cm   Diastology LVIDs:         3.40 cm   LV e' medial:    7.40 cm/s LV PW:         1.00 cm   LV E/e' medial:  9.2 LV IVS:        1.00 cm   LV e' lateral:   10.40 cm/s LVOT diam:     2.20 cm   LV E/e' lateral: 6.5 LV SV:         91 LV SV  Index:   41        2D Longitudinal Strain LVOT Area:     3.80 cm  2D Strain GLS (A4C):   15.3 %                          2D Strain GLS (A3C):   20.5 %                          2D Strain GLS (A2C):   19.0 %                          2D Strain GLS Avg:     18.3 % RIGHT VENTRICLE RV Basal diam:  3.60 cm RV S prime:     15.60 cm/s TAPSE (M-mode): 2.4 cm LEFT ATRIUM           Index        RIGHT ATRIUM           Index LA diam:      4.10 cm 1.87 cm/m   RA Area:     26.50 cm LA Vol (A2C): 44.5 ml 20.26 ml/m  RA Volume:   99.10 ml  45.12 ml/m LA Vol (A4C): 35.2 ml 16.03 ml/m  AORTIC VALVE                 PULMONIC VALVE AV Area (Vmax): 3.00 cm     PV Vmax:        1.14 m/s AV Vmax:        133.00 cm/s  PV Peak grad:   5.2 mmHg AV Peak Grad:   7.1 mmHg     RVOT Peak grad: 3 mmHg LVOT Vmax:      105.00 cm/s LVOT Vmean:     75.000 cm/s LVOT VTI:       0.239 m  AORTA Ao Root diam: 3.50 cm Ao Asc diam:  3.10 cm MITRAL VALVE               TRICUSPID VALVE MV Area (PHT): 2.74 cm    TR Peak grad:   24.0 mmHg MV Decel Time: 277 msec    TR Vmax:        245.00 cm/s MV E velocity: 68.10 cm/s MV A velocity: 52.90 cm/s  SHUNTS MV E/A ratio:  1.29        Systemic VTI:  0.24 m                            Systemic Diam: 2.20 cm Cara JONETTA Lovelace MD Electronically signed by Cara JONETTA Lovelace MD Signature Date/Time: 03/08/2024/9:44:31 AM    Final    US  RENAL Result Date: 03/07/2024 CLINICAL DATA:  Acute renal injury EXAM: RENAL / URINARY TRACT ULTRASOUND COMPLETE COMPARISON:  None Available. FINDINGS: Right Kidney: Renal measurements: 9.9 x 4.9 x 5.4 cm. = volume: 137 mL. Increased echogenicity is noted. No mass or hydronephrosis is seen. Left Kidney: Renal measurements: 9.5 x 4.8 x 4.8 cm. =  volume: 114 mL. Increased echogenicity is noted. No mass or hydronephrosis is noted. Bladder: Appears normal for degree of bladder distention. Other: None. IMPRESSION: Changes consistent with medical renal disease. No other focal abnormality is noted. Electronically Signed   By: Oneil Devonshire M.D.   On: 03/07/2024 22:13   US  Venous Img Lower Unilateral Right (DVT) Result  Date: 03/07/2024 CLINICAL DATA:  Right lower extremity swelling EXAM: RIGHT LOWER EXTREMITY VENOUS DOPPLER ULTRASOUND TECHNIQUE: Gray-scale sonography with graded compression, as well as color Doppler and duplex ultrasound were performed to evaluate the lower extremity deep venous systems from the level of the common femoral vein and including the common femoral, femoral, profunda femoral, popliteal and calf veins including the posterior tibial, peroneal and gastrocnemius veins when visible. The superficial great saphenous vein was also interrogated. Spectral Doppler was utilized to evaluate flow at rest and with distal augmentation maneuvers in the common femoral, femoral and popliteal veins. COMPARISON:  None Available. FINDINGS: Contralateral Common Femoral Vein: Respiratory phasicity is normal and symmetric with the symptomatic side. No evidence of thrombus. Normal compressibility. Common Femoral Vein: No evidence of thrombus. Normal compressibility, respiratory phasicity and response to augmentation. Saphenofemoral Junction: No evidence of thrombus. Normal compressibility and flow on color Doppler imaging. Profunda Femoral Vein: No evidence of thrombus. Normal compressibility and flow on color Doppler imaging. Femoral Vein: Thrombus is noted with decreased compressibility. Popliteal Vein: Thrombus is noted with decreased compressibility. Calf Veins: Not well visualized Superficial Great Saphenous Vein: No evidence of thrombus. Normal compressibility. Venous Reflux:  None. Other Findings:  None. 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 Electronically Signed   By: Oneil Devonshire M.D.   On: 03/07/2024 22:12   Portable chest x-ray (1 view) Result Date: 03/07/2024 CLINICAL DATA:  Bradycardia EXAM: PORTABLE CHEST 1 VIEW COMPARISON:  05/05/2019 FINDINGS: The lungs are symmetrically hyperinflated in keeping with changes of underlying COPD. 15 mm nodular opacity seen at the left costophrenic angle, indeterminate but new since prior examination. Lungs are otherwise clear. No pneumothorax or pleural effusion. Cardiac size is mildly enlarged, stable. Pulmonary vascularity is normal. Osseous structures are age appropriate. IMPRESSION: 1. No radiographic evidence of acute cardiopulmonary disease. 2. COPD. 3. 15 mm nodular opacity at the left costophrenic angle, indeterminate. This could be better assessed with nonemergent CT imaging if indicated. Electronically Signed   By: Dorethia Molt M.D.   On: 03/07/2024 20:19   CT HEAD WO CONTRAST ( ) Result Date: 03/07/2024 CLINICAL DATA:  Delirium EXAM: CT HEAD WITHOUT CONTRAST TECHNIQUE: Contiguous axial images were obtained from the base of the skull through the vertex without intravenous contrast. RADIATION DOSE REDUCTION: This exam was performed according to the departmental dose-optimization program which includes automated exposure control, adjustment of the mA and/or kV according to patient size and/or use of iterative reconstruction technique. COMPARISON:  07/20/2008 FINDINGS: Brain: There is no mass, hemorrhage or extra-axial collection. The appearance of the white matter is normal for the patient's age. There is generalized atrophy. Vascular: Atherosclerotic calcification of the internal carotid arteries at the skull base. No abnormal hyperdensity of the major intracranial arteries or dural venous sinuses. Skull: The visualized skull base, calvarium and extracranial soft tissues are normal. Sinuses/Orbits: Bilateral maxillary sinus mucosal  thickening. Small amount of right mastoid fluid. Normal orbits. Other: None. IMPRESSION: 1. No acute intracranial abnormality. 2. Bilateral maxillary sinus mucosal thickening. 3. Small amount of right mastoid fluid. Electronically Signed   By: Franky Stanford M.D.   On: 03/07/2024 20:16    Microbiology: Results for orders placed or performed during the hospital encounter of 03/07/24  MRSA Next Gen by PCR, Nasal     Status: None   Collection Time: 03/07/24  9:11 PM   Specimen: Nasal Mucosa; Nasal Swab  Result Value Ref Range Status   MRSA by  PCR Next Gen NOT DETECTED NOT DETECTED Final    Comment: (NOTE) The GeneXpert MRSA Assay (FDA approved for NASAL specimens only), is one component of a comprehensive MRSA colonization surveillance program. It is not intended to diagnose MRSA infection nor to guide or monitor treatment for MRSA infections. Test performance is not FDA approved in patients less than 16 years old. Performed at [facility], [address] [place], [place] [address]     Labs: CBC: Recent Labs  Lab 03/07/24 1511 03/08/24 0446 03/09/24 0422  WBC 5.7 7.3 6.3  HGB 11.0* 10.4* 11.3*  HCT 33.0* 31.6* 34.0*  MCV 92.2 92.4 93.2  PLT 156 145* 143*   Basic Metabolic Panel: Recent Labs  Lab 03/07/24 1511 03/07/24 1954 03/07/24 2300 03/08/24 0315 03/08/24 0446 03/09/24 0422 03/10/24 0449  NA 126*   < > 135 136 135 136 139  K 6.2*   < > 3.8 3.8 3.9 4.5 4.0  CL 95*   < > 102 102 104 105 106  CO2 22   < > 24 26 25 25 27   GLUCOSE 603*   < > 386* 164* 137* 212* 172*  BUN 38*   < > 35* 32* 30* 24* 23  CREATININE 1.62*   < > 1.54* 1.38* 1.32* 1.26* 1.26*  CALCIUM  8.4*   < > 8.5* 8.8* 8.6* 8.6* 8.7*  MG 1.9  --   --   --   --   --  1.8   < > = values in this interval not displayed.   Liver Function Tests: Recent Labs  Lab 03/07/24 1954 03/08/24 0446  AST 30 24  ALT 27 20  ALKPHOS 62 50  BILITOT 0.9 0.7  PROT 5.8* 5.1*  ALBUMIN 3.4* 2.9*    CBG: Recent Labs  Lab 03/10/24 1044 03/10/24 1438 03/10/24 2207 03/11/24 0800 03/11/24 1217  GLUCAP 206* 286* 280* 176* 237*    Discharge time spent: greater than 30 minutes.  This record has been created using Conservation officer, historic buildings. Errors have been sought and corrected,but may not always be located. Such creation errors do not reflect on the standard of care.   Signed: Amaryllis Dare, MD [facility] 03/11/2024

## 2024-03-11 NOTE — Progress Notes (Signed)
 [facility] CLINIC CARDIOLOGY PROGRESS NOTE       Patient ID: Carl Yang MRN: [id number] DOB/AGE: 03-24-1941 83 y.o.  Admit date: 03/07/2024 Referring Physician Dr. Prentice Core Primary Physician Alla Amis, MD Primary Cardiologist Dr. Bosie [phone]) Reason for Consultation bradycardia high grade AVB/ atrial fibrillation  HPI: Carl Yang is a 83 y.o. male  with a past medical history of coronary artery disease s/p stent (1999), paroxymal atrial fibrillation, hypertension, sleep apnea, type 1 diabetes mellitus, hx DVT, CKD stage 3, mixed dementia  who presented to the ED on 03/07/2024 for increasing confusion. Patient brought from independent living facility. Potassium found to be > 6. Cardiology was consulted for further evaluation of bradycardia high grade AVB and paroxymal atrial fibrillation  Interval History: -Patient seen and examined this AM and sitting up eating breakfast in no acute distress. Patient states he continues to feels good  and denies chest pain, palpitations, SOB, lightheadedness or dizziness. -Per tele this AM rates in 30-40s with complete heart block. -Patients BP elevated and HR variable.  -Patient remains on room air with stable SpO2.     Review of systems complete and found to be negative unless listed above    Past Medical History:  Diagnosis Date   Arthritis    Atrial fibrillation (HCC)    Cancer (HCC)    Melanoma   Chronic anticoagulation    Warfarin   Complication of anesthesia    After surgery at [facility], the patient coded during the night.   Coronary artery disease    Stents   Diabetes mellitus without complication (HCC)    DVT (deep venous thrombosis) (HCC)    Right lower limb   Dyspnea    GERD (gastroesophageal reflux disease)    Insulin  pump in place    Myocardial infarction (HCC)    1998, 2003   Neuromuscular disorder (HCC)    Peripheral Neuropathy r/t DM   Peripheral vascular disease (HCC)    Diabetes   Sleep apnea     Uses CPAP    Past Surgical History:  Procedure Laterality Date   AMPUTATION TOE Left    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   AMPUTATION TOE Left 10/09/2018   Procedure: TOE IPJ 2ND TOE LEFT;  Surgeon: Lilli Cough, DPM;  Location: [facility];  Service: Podiatry;  Laterality: Left;  IVA LOCAL Diabeticv - insulin  pump   BACK SURGERY     L4 & L5 Fusion   BONE EXCISION Left 07/08/2020   Procedure: EXCISION BONE METATARSAL LEFT;  Surgeon: Ashley Soulier, DPM;  Location: [facility] ORS;  Service: Podiatry;  Laterality: Left;   CARDIAC CATHETERIZATION  2003   CORONARY ANGIOPLASTY WITH STENT PLACEMENT     stents x 2   DIAGNOSTIC LAPAROSCOPY     EYE SURGERY Bilateral    cataracts   INCISION AND DRAINAGE OF WOUND Left 06/07/2023   Procedure: IRRIGATION AND DEBRIDEMENT BONE OF LEFT HEEL;  Surgeon: Neill Boas, DPM;  Location: [facility] ORS;  Service: Orthopedics/Podiatry;  Laterality: Left;   LOWER EXTREMITY ANGIOGRAPHY Left 06/06/2023   Procedure: Lower Extremity Angiography;  Surgeon: Jama Cordella MATSU, MD;  Location: [facility] INVASIVE CV LAB;  Service: Cardiovascular;  Laterality: Left;   PARTIAL KNEE ARTHROPLASTY Left 02/09/2020   Procedure: UNICOMPARTMENTAL KNEE;  Surgeon: Edie Norleen PARAS, MD;  Location: [facility] ORS;  Service: Orthopedics;  Laterality: Left;   TONSILLECTOMY     TOTAL KNEE ARTHROPLASTY Right 07/15/2018   Procedure: TOTAL KNEE ARTHROPLASTY;  Surgeon: Edie,  Norleen PARAS, MD;  Location: [facility] ORS;  Service: Orthopedics;  Laterality: Right;   TOTAL KNEE REVISION Right 05/05/2019   Procedure: TOTAL KNEE REVISION;  Surgeon: Edie Norleen PARAS, MD;  Location: [facility] OR[facility];  Service: Orthopedics;  Laterality: Right;    Medications Prior to Admission  Medication Sig Dispense Refill Last Dose/Taking   Besifloxacin HCl (BESIVANCE) 0.6 % SUSP Place 1 drop into the left eye 4 (four) times daily. QID x 2 days after each monthly eye injection   Taking   collagenase (SANTYL) 250 UNIT/GM ointment Apply 1  Application topically daily.   Taking   donepezil (ARICEPT) 5 MG tablet Take 5 mg by mouth at bedtime.   03/06/2024   fexofenadine (ALLEGRA) 180 MG tablet Take 180 mg by mouth daily as needed for allergies.  1 Past Month   insulin  aspart (NOVOLOG ) 100 UNIT/ML injection Inject 1-5 Units into the skin 3 (three) times daily with meals. Use sliding scale   Taking   insulin  glargine (LANTUS ) 100 UNIT/ML injection Inject 35 Units into the skin at bedtime.   Taking   metFORMIN  (GLUCOPHAGE ) 500 MG tablet Take 500 mg by mouth 2 (two) times daily with a meal.   03/07/2024   Multiple Vitamin (MULTIVITAMIN WITH MINERALS) TABS tablet Take 1 tablet by mouth daily.   03/07/2024   ramipril  (ALTACE ) 2.5 MG capsule Take 2.5 mg by mouth daily.   03/07/2024   silver sulfADIAZINE (SILVADENE) 1 % cream Apply 1 Application topically daily.   03/07/2024   ELIQUIS  2.5 MG TABS tablet Take 2.5 mg by mouth 2 (two) times daily.   03/07/2024   esomeprazole  (NEXIUM ) 40 MG capsule Take 1 capsule (40 mg total) by mouth daily. (Patient taking differently: Take 40 mg by mouth every morning.) 30 capsule 0 03/07/2024   rosuvastatin  (CRESTOR ) 40 MG tablet Take 40 mg by mouth daily.   03/07/2024   Social History   Socioeconomic History   Marital status: Widowed    Spouse name: Not on file   Number of children: Not on file   Years of education: Not on file   Highest education level: Not on file  Occupational History   Not on file  Tobacco Use   Smoking status: Never   Smokeless tobacco: Never  Vaping Use   Vaping status: Never Used  Substance and Sexual Activity   Alcohol use: Yes    Alcohol/week: 1.0 standard drink of alcohol    Types: 1 Glasses of wine per week    Comment: Socially   Drug use: Never   Sexual activity: Not Currently  Other Topics Concern   Not on file  Social History Narrative   Not on file   Social Drivers of Health   Financial Resource Strain: Low Risk  (05/07/2023)   Received from [facility]   Overall Financial Resource Strain (CARDIA)    Difficulty of Paying Living Expenses: Not hard at all  Food Insecurity: No Food Insecurity (03/07/2024)   Hunger Vital Sign    Worried About Running Out of Food in the Last Year: Never true    Ran Out of Food in the Last Year: Never true  Transportation Needs: No Transportation Needs (03/07/2024)   PRAPARE - Administrator, Civil Service (Medical): No    Lack of Transportation (Non-Medical): No  Physical Activity: Not on file  Stress: Not on file  Social Connections: Moderately Isolated (03/07/2024)   Social Connection and Isolation Panel    Frequency  of Communication with Friends and Family: More than three times a week    Frequency of Social Gatherings with Friends and Family: Patient unable to answer    Attends Religious Services: 1 to 4 times per year    Active Member of [employer] or Organizations: No    Attends Banker Meetings: Patient unable to answer    Marital Status: Widowed  Intimate Partner Violence: Not At Risk (03/07/2024)   Humiliation, Afraid, Rape, and Kick questionnaire    Fear of Current or Ex-Partner: No    Emotionally Abused: No    Physically Abused: No    Sexually Abused: No    History reviewed. No pertinent family history.   Vitals:   03/11/24 0237 03/11/24 0351 03/11/24 0747 03/11/24 0748  BP: (!) 153/58 (!) 144/55 (!) 145/71   Pulse: 64 (!) 39 (!) 41 (!) 25  Resp: 18 18    Temp: 98.2 F (36.8 C) (!) 97 F (36.1 C) (!) 97.1 F (36.2 C) (!) 97.1 F (36.2 C)  TempSrc: Oral  Axillary   SpO2: 100% 100% 100% 97%  Weight:      Height:        PHYSICAL EXAM General: well appearing elderly male, well nourished, in no acute distress. HEENT: Normocephalic and atraumatic. Neck: No JVD.   Lungs: Normal respiratory effort on room air. Clear bilaterally to auscultation. No wheezes, crackles, rhonchi.  Heart: Irregularly, irregular, variable rate. Normal S1 and S2 without gallops  or murmurs.  Abdomen: Non-distended appearing.  Msk: Normal strength and tone for age. Extremities: Warm and well perfused. No clubbing, cyanosis, edema. Right foot ulcer.  Neuro: Alert and oriented X 3. Psych: Answers questions appropriately.   Labs: Basic Metabolic Panel: Recent Labs    03/09/24 0422 03/10/24 0449  NA 136 139  K 4.5 4.0  CL 105 106  CO2 25 27  GLUCOSE 212* 172*  BUN 24* 23  CREATININE 1.26* 1.26*  CALCIUM  8.6* 8.7*  MG  --  1.8   Liver Function Tests: No results for input(s): AST, ALT, ALKPHOS, BILITOT, PROT, ALBUMIN in the last 72 hours.  No results for input(s): LIPASE, AMYLASE in the last 72 hours. CBC: Recent Labs    03/09/24 0422  WBC 6.3  HGB 11.3*  HCT 34.0*  MCV 93.2  PLT 143*   Cardiac Enzymes: No results for input(s): CKTOTAL, CKMB, CKMBINDEX, TROPONINIHS in the last 72 hours.  BNP: No results for input(s): BNP in the last 72 hours. D-Dimer: No results for input(s): DDIMER in the last 72 hours. Hemoglobin A1C: No results for input(s): HGBA1C in the last 72 hours.  Fasting Lipid Panel: No results for input(s): CHOL, HDL, LDLCALC, TRIG, CHOLHDL, LDLDIRECT in the last 72 hours. Thyroid Function Tests: No results for input(s): TSH, T4TOTAL, T3FREE, THYROIDAB in the last 72 hours.  Invalid input(s): FREET3  Anemia Panel: No results for input(s): VITAMINB12, FOLATE, FERRITIN, TIBC, IRON, RETICCTPCT in the last 72 hours.    Radiology: ECHOCARDIOGRAM COMPLETE Result Date: 03/08/2024    ECHOCARDIOGRAM REPORT   Patient Name:   Carl Yang Date of Exam: 03/08/2024 Medical Rec #:  [id number]          Height:       74.0 in Accession #:    [id number]         Weight:       205.0 lb Date of Birth:  20-Sep-1940          BSA:  2.197 m Patient Age:    83 years           BP:           117/55 mmHg Patient Gender: M                  HR:           41 bpm. Exam Location:  [facility]  Procedure: 2D Echo, Cardiac Doppler, Color Doppler and Strain Analysis (Both            Spectral and Color Flow Doppler were utilized during procedure). Indications:     Abnormal ECG R94.31  History:         Patient has no prior history of Echocardiogram examinations.  Sonographer:     Thedora Louder RDCS, FASE Referring Phys:  [id number] PRENTICE BROCKS CORE Diagnosing Phys: Cara JONETTA Lovelace MD  Sonographer Comments: Global longitudinal strain was attempted. IMPRESSIONS  1. Left ventricular ejection fraction, by estimation, is 55 to 60%. The left ventricle has normal function. The left ventricle has no regional wall motion abnormalities. Left ventricular diastolic parameters were normal. The average left ventricular global longitudinal strain is 18.3 %. The global longitudinal strain is normal.  2. Right ventricular systolic function is normal. The right ventricular size is normal.  3. The mitral valve is normal in structure. Trivial mitral valve regurgitation.  4. The aortic valve is normal in structure. Aortic valve regurgitation is not visualized. FINDINGS  Left Ventricle: Left ventricular ejection fraction, by estimation, is 55 to 60%. The left ventricle has normal function. The left ventricle has no regional wall motion abnormalities. The average left ventricular global longitudinal strain is 18.3 %. Strain was performed and the global longitudinal strain is normal. The left ventricular internal cavity size was normal in size. There is no left ventricular hypertrophy. Left ventricular diastolic parameters were normal. Right Ventricle: The right ventricular size is normal. No increase in right ventricular wall thickness. Right ventricular systolic function is normal. Left Atrium: Left atrial size was normal in size. Right Atrium: Right atrial size was normal in size. Pericardium: There is no evidence of pericardial effusion. Mitral Valve: The mitral valve is normal in structure. Trivial mitral valve regurgitation.  Tricuspid Valve: The tricuspid valve is normal in structure. Tricuspid valve regurgitation is mild. Aortic Valve: The aortic valve is normal in structure. Aortic valve regurgitation is not visualized. Aortic valve peak gradient measures 7.1 mmHg. Pulmonic Valve: The pulmonic valve was normal in structure. Pulmonic valve regurgitation is not visualized. Aorta: The ascending aorta was not well visualized. IAS/Shunts: No atrial level shunt detected by color flow Doppler. Additional Comments: 3D was performed not requiring image post processing on an independent workstation and was normal.  LEFT VENTRICLE PLAX 2D LVIDd:         4.80 cm   Diastology LVIDs:         3.40 cm   LV e' medial:    7.40 cm/s LV PW:         1.00 cm   LV E/e' medial:  9.2 LV IVS:        1.00 cm   LV e' lateral:   10.40 cm/s LVOT diam:     2.20 cm   LV E/e' lateral: 6.5 LV SV:         91 LV SV Index:   41        2D Longitudinal Strain LVOT Area:     3.80 cm  2D Strain GLS (  A4C):   15.3 %                          2D Strain GLS (A3C):   20.5 %                          2D Strain GLS (A2C):   19.0 %                          2D Strain GLS Avg:     18.3 % RIGHT VENTRICLE RV Basal diam:  3.60 cm RV S prime:     15.60 cm/s TAPSE (M-mode): 2.4 cm LEFT ATRIUM           Index        RIGHT ATRIUM           Index LA diam:      4.10 cm 1.87 cm/m   RA Area:     26.50 cm LA Vol (A2C): 44.5 ml 20.26 ml/m  RA Volume:   99.10 ml  45.12 ml/m LA Vol (A4C): 35.2 ml 16.03 ml/m  AORTIC VALVE                 PULMONIC VALVE AV Area (Vmax): 3.00 cm     PV Vmax:        1.14 m/s AV Vmax:        133.00 cm/s  PV Peak grad:   5.2 mmHg AV Peak Grad:   7.1 mmHg     RVOT Peak grad: 3 mmHg LVOT Vmax:      105.00 cm/s LVOT Vmean:     75.000 cm/s LVOT VTI:       0.239 m  AORTA Ao Root diam: 3.50 cm Ao Asc diam:  3.10 cm MITRAL VALVE               TRICUSPID VALVE MV Area (PHT): 2.74 cm    TR Peak grad:   24.0 mmHg MV Decel Time: 277 msec    TR Vmax:        245.00 cm/s MV E  velocity: 68.10 cm/s MV A velocity: 52.90 cm/s  SHUNTS MV E/A ratio:  1.29        Systemic VTI:  0.24 m                            Systemic Diam: 2.20 cm Cara JONETTA Lovelace MD Electronically signed by Cara JONETTA Lovelace MD Signature Date/Time: 03/08/2024/9:44:31 AM    Final    US  RENAL Result Date: 03/07/2024 CLINICAL DATA:  Acute renal injury EXAM: RENAL / URINARY TRACT ULTRASOUND COMPLETE COMPARISON:  None Available. FINDINGS: Right Kidney: Renal measurements: 9.9 x 4.9 x 5.4 cm. = volume: 137 mL. Increased echogenicity is noted. No mass or hydronephrosis is seen. Left Kidney: Renal measurements: 9.5 x 4.8 x 4.8 cm. = volume: 114 mL. Increased echogenicity is noted. No mass or hydronephrosis is noted. Bladder: Appears normal for degree of bladder distention. Other: None. IMPRESSION: Changes consistent with medical renal disease. No other focal abnormality is noted. Electronically Signed   By: Oneil Devonshire M.D.   On: 03/07/2024 22:13   US  Venous Img Lower Unilateral Right (DVT) Result Date: 03/07/2024 CLINICAL DATA:  Right lower extremity swelling EXAM: RIGHT LOWER EXTREMITY VENOUS DOPPLER ULTRASOUND TECHNIQUE: Gray-scale sonography with graded compression, as well as color  Doppler and duplex ultrasound were performed to evaluate the lower extremity deep venous systems from the level of the common femoral vein and including the common femoral, femoral, profunda femoral, popliteal and calf veins including the posterior tibial, peroneal and gastrocnemius veins when visible. The superficial great saphenous vein was also interrogated. Spectral Doppler was utilized to evaluate flow at rest and with distal augmentation maneuvers in the common femoral, femoral and popliteal veins. COMPARISON:  None Available. FINDINGS: Contralateral Common Femoral Vein: Respiratory phasicity is normal and symmetric with the symptomatic side. No evidence of thrombus. Normal compressibility. Common Femoral Vein: No evidence of thrombus.  Normal compressibility, respiratory phasicity and response to augmentation. Saphenofemoral Junction: No evidence of thrombus. Normal compressibility and flow on color Doppler imaging. Profunda Femoral Vein: No evidence of thrombus. Normal compressibility and flow on color Doppler imaging. Femoral Vein: Thrombus is noted with decreased compressibility. Popliteal Vein: Thrombus is noted with decreased compressibility. Calf Veins: Not well visualized Superficial Great Saphenous Vein: No evidence of thrombus. Normal compressibility. Venous Reflux:  None. Other Findings:  None. 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 Electronically Signed   By: Oneil Devonshire M.D.   On: 03/07/2024 22:12   Portable chest x-ray (1 view) Result Date: 03/07/2024 CLINICAL DATA:  Bradycardia EXAM: PORTABLE CHEST 1 VIEW COMPARISON:  05/05/2019 FINDINGS: The lungs are symmetrically hyperinflated in keeping with changes of underlying COPD. 15 mm nodular opacity seen at the left costophrenic angle, indeterminate but new since prior examination. Lungs are otherwise clear. No pneumothorax or pleural effusion. Cardiac size is mildly enlarged, stable. Pulmonary vascularity is normal. Osseous structures are age appropriate. IMPRESSION: 1. No radiographic evidence of acute cardiopulmonary disease. 2. COPD. 3. 15 mm nodular opacity at the left costophrenic angle, indeterminate. This could be better assessed with nonemergent CT imaging if indicated. Electronically Signed   By: Dorethia Molt M.D.   On: 03/07/2024 20:19   CT HEAD WO CONTRAST ( ) Result Date: 03/07/2024 CLINICAL DATA:  Delirium EXAM: CT HEAD WITHOUT CONTRAST TECHNIQUE: Contiguous axial images were obtained from the base of the skull through the vertex without intravenous contrast. RADIATION DOSE REDUCTION: This exam was performed according to the departmental dose-optimization program which includes automated exposure  control, adjustment of the mA and/or kV according to patient size and/or use of iterative reconstruction technique. COMPARISON:  07/20/2008 FINDINGS: Brain: There is no mass, hemorrhage or extra-axial collection. The appearance of the white matter is normal for the patient's age. There is generalized atrophy. Vascular: Atherosclerotic calcification of the internal carotid arteries at the skull base. No abnormal hyperdensity of the major intracranial arteries or dural venous sinuses. Skull: The visualized skull base, calvarium and extracranial soft tissues are normal. Sinuses/Orbits: Bilateral maxillary sinus mucosal thickening. Small amount of right mastoid fluid. Normal orbits. Other: None. IMPRESSION: 1. No acute intracranial abnormality. 2. Bilateral maxillary sinus mucosal thickening. 3. Small amount of right mastoid fluid. Electronically Signed   By: Franky Stanford M.D.   On: 03/07/2024 20:16    ECHO as above  TELEMETRY reviewed by me 03/11/2024: complete heart block with rates 30-40s   EKG reviewed by me: third-degree AV block A-V dissociation, 39 bpm 03/07/24:  Atrial fibrillation, 90 bpm with nonspecific ST-T wave changes  Data reviewed by me 03/11/2024: last 24h vitals tele labs imaging I/O hospitalist progress notes.  Principal Problem:   Bradycardia Active Problems:   Essential hypertension   OSA (obstructive sleep apnea)   GERD (gastroesophageal  reflux disease)   Hyperosmolar hyperglycemic state (HHS) (HCC)   AKI (acute kidney injury) (HCC)   Elevated troponin   Hyperkalemia   Hyponatremia   Secondary hypercoagulability disorder (HCC)   Acute metabolic encephalopathy   Cellulitis   Failure to thrive in adult   Dementia [facility])    ASSESSMENT AND PLAN:  Carl Yang is a 83 y.o. male  with a past medical history of coronary artery disease s/p stent (1999), paroxymal atrial fibrillation, hypertension, sleep apnea, type 1 diabetes mellitus, hx DVT, CKD stage 3, mixed dementia   who presented to the ED on 03/07/2024 for increasing confusion. Patient brought from independent living facility. Potassium found to be > 6. Cardiology was consulted for further evaluation of bradycardia high grade AVB and paroxymal atrial fibrillation  # Bradycardia with transient complete heart block # Paroxsymal atrial fibrillation  # Hyperkalemia, resolved EKG with evidence of third degree AVB, AV dissociation with rate 30s. EKG with atrial fibrillation rate 90s. Per tele this AM rates 40s, with complete heart block. Patient remains aSX. -Keep potassium less than 4. Monitor closely -Avoid all AVN blockers. -Continue Eliquis  2.5 mg twice daily for stroke risk reduction.  -Do not recommend temporary or permanent pacemaker at this stage because of comorbid disease. Would recommend conservative management as per family's request.   # Mixed Dementia # Acute metabolic encephalopathy, resolved Patient presents with increasing confusion from independent living facility. Patient states he's feeling well and answers all questions appropriately this AM. -Management per primary team.  # Hypertension # Hyperlipidemia # Coronary artery disease s/p stent (1999) EKG without acute ischemic changes. Trops minimally elevated and flat 38 > 39. Echo this admission with pEF, no RWMA, no significant valvular dysfunction.  -Continue Crestor  40 mg daily.  -Continue home ramipril  to 5 mg daily.  -Minimally elevated and flat is most consistent with demand/supply mismatch and not ACS   This patient's plan of care was discussed and created with Dr. Florencio and he is in agreement.  Signed: Dorene Comfort, PA-C  03/11/2024, 11:15 AM [facility]

## 2024-03-11 NOTE — Plan of Care (Signed)
  Problem: Education: Goal: Ability to describe self-care measures that may prevent or decrease complications (Diabetes Survival Skills Education) will improve Outcome: Progressing   Problem: Coping: Goal: Ability to adjust to condition or change in health will improve Outcome: Progressing   Problem: Fluid Volume: Goal: Ability to maintain a balanced intake and output will improve Outcome: Progressing   Problem: Metabolic: Goal: Ability to maintain appropriate glucose levels will improve Outcome: Progressing   Problem: Nutritional: Goal: Maintenance of adequate nutrition will improve Outcome: Progressing   Problem: Nutritional: Goal: Progress toward achieving an optimal weight will improve Outcome: Progressing   Problem: Skin Integrity: Goal: Risk for impaired skin integrity will decrease Outcome: Progressing   Problem: Tissue Perfusion: Goal: Adequacy of tissue perfusion will improve Outcome: Progressing   Problem: Fluid Volume: Goal: Ability to achieve a balanced intake and output will improve Outcome: Progressing   Problem: Skin Integrity: Goal: Skin integrity will improve Outcome: Progressing   Problem: Clinical Measurements: Goal: Cardiovascular complication will be avoided Outcome: Progressing   Problem: Clinical Measurements: Goal: Respiratory complications will improve Outcome: Progressing   Problem: Clinical Measurements: Goal: Diagnostic test results will improve Outcome: Progressing   Plan of care, assessment, treatment, monitoring and intervention (s)  ongoing, see MAR see flowsheet

## 2024-03-26 ENCOUNTER — Encounter (INDEPENDENT_AMBULATORY_CARE_PROVIDER_SITE_OTHER): Admitting: Ophthalmology

## 2024-03-26 DIAGNOSIS — Z794 Long term (current) use of insulin: Secondary | ICD-10-CM

## 2024-03-26 DIAGNOSIS — E113312 Type 2 diabetes mellitus with moderate nonproliferative diabetic retinopathy with macular edema, left eye: Secondary | ICD-10-CM

## 2024-03-26 DIAGNOSIS — H43813 Vitreous degeneration, bilateral: Secondary | ICD-10-CM

## 2024-03-26 DIAGNOSIS — Z7984 Long term (current) use of oral hypoglycemic drugs: Secondary | ICD-10-CM | POA: Diagnosis not present

## 2024-03-26 DIAGNOSIS — E113391 Type 2 diabetes mellitus with moderate nonproliferative diabetic retinopathy without macular edema, right eye: Secondary | ICD-10-CM | POA: Diagnosis not present

## 2024-05-07 ENCOUNTER — Encounter (INDEPENDENT_AMBULATORY_CARE_PROVIDER_SITE_OTHER): Admitting: Ophthalmology

## 2024-05-07 DIAGNOSIS — H43813 Vitreous degeneration, bilateral: Secondary | ICD-10-CM

## 2024-05-07 DIAGNOSIS — Z794 Long term (current) use of insulin: Secondary | ICD-10-CM | POA: Diagnosis not present

## 2024-05-07 DIAGNOSIS — Z7984 Long term (current) use of oral hypoglycemic drugs: Secondary | ICD-10-CM | POA: Diagnosis not present

## 2024-05-07 DIAGNOSIS — E113312 Type 2 diabetes mellitus with moderate nonproliferative diabetic retinopathy with macular edema, left eye: Secondary | ICD-10-CM | POA: Diagnosis not present

## 2024-05-07 DIAGNOSIS — E113391 Type 2 diabetes mellitus with moderate nonproliferative diabetic retinopathy without macular edema, right eye: Secondary | ICD-10-CM

## 2024-06-18 ENCOUNTER — Encounter (INDEPENDENT_AMBULATORY_CARE_PROVIDER_SITE_OTHER): Admitting: Ophthalmology
